# Patient Record
Sex: Female | Born: 1970 | Race: Black or African American | Hispanic: No | Marital: Single | Smoking: Never smoker
Health system: Southern US, Community
[De-identification: ages and names within clinical notes are randomized; demographics above are authoritative.]

## PROBLEM LIST (undated history)

## (undated) DIAGNOSIS — G4730 Sleep apnea, unspecified: Secondary | ICD-10-CM

## (undated) DIAGNOSIS — K0889 Other specified disorders of teeth and supporting structures: Secondary | ICD-10-CM

## (undated) DIAGNOSIS — L309 Dermatitis, unspecified: Secondary | ICD-10-CM

## (undated) DIAGNOSIS — J45909 Unspecified asthma, uncomplicated: Secondary | ICD-10-CM

## (undated) DIAGNOSIS — G8929 Other chronic pain: Secondary | ICD-10-CM

## (undated) DIAGNOSIS — J42 Unspecified chronic bronchitis: Secondary | ICD-10-CM

## (undated) DIAGNOSIS — R5383 Other fatigue: Secondary | ICD-10-CM

## (undated) DIAGNOSIS — J301 Allergic rhinitis due to pollen: Secondary | ICD-10-CM

## (undated) DIAGNOSIS — R7303 Prediabetes: Secondary | ICD-10-CM

## (undated) DIAGNOSIS — R0602 Shortness of breath: Secondary | ICD-10-CM

## (undated) DIAGNOSIS — R739 Hyperglycemia, unspecified: Secondary | ICD-10-CM

## (undated) DIAGNOSIS — I10 Essential (primary) hypertension: Secondary | ICD-10-CM

## (undated) DIAGNOSIS — M7989 Other specified soft tissue disorders: Secondary | ICD-10-CM

## (undated) DIAGNOSIS — M545 Low back pain, unspecified: Secondary | ICD-10-CM

## (undated) DIAGNOSIS — Z8489 Family history of other specified conditions: Secondary | ICD-10-CM

## (undated) DIAGNOSIS — S0300XA Dislocation of jaw, unspecified side, initial encounter: Secondary | ICD-10-CM

## (undated) HISTORY — PX: GINGIVECTOMY: SHX1707

## (undated) HISTORY — DX: Low back pain: M54.5

## (undated) HISTORY — DX: Other specified soft tissue disorders: M79.89

## (undated) HISTORY — DX: Dislocation of jaw, unspecified side, initial encounter: S03.00XA

## (undated) HISTORY — DX: Other specified disorders of teeth and supporting structures: K08.89

## (undated) HISTORY — DX: Low back pain, unspecified: M54.50

## (undated) HISTORY — PX: ANKLE FRACTURE SURGERY: SHX122

## (undated) HISTORY — DX: Dermatitis, unspecified: L30.9

## (undated) HISTORY — DX: Essential (primary) hypertension: I10

## (undated) HISTORY — DX: Hyperglycemia, unspecified: R73.9

## (undated) HISTORY — DX: Other chronic pain: G89.29

## (undated) HISTORY — DX: Unspecified asthma, uncomplicated: J45.909

## (undated) HISTORY — DX: Other fatigue: R53.83

## (undated) HISTORY — DX: Shortness of breath: R06.02

---

## 1998-02-09 ENCOUNTER — Encounter: Admission: RE | Admit: 1998-02-09 | Discharge: 1998-02-09 | Payer: Self-pay | Admitting: *Deleted

## 2000-07-03 HISTORY — PX: SHOULDER SURGERY: SHX246

## 2000-09-10 ENCOUNTER — Emergency Department (HOSPITAL_COMMUNITY): Admission: EM | Admit: 2000-09-10 | Discharge: 2000-09-11 | Payer: Self-pay

## 2000-09-11 ENCOUNTER — Encounter: Payer: Self-pay | Admitting: Emergency Medicine

## 2003-05-14 ENCOUNTER — Other Ambulatory Visit: Admission: RE | Admit: 2003-05-14 | Discharge: 2003-05-14 | Payer: Self-pay | Admitting: Internal Medicine

## 2004-07-08 ENCOUNTER — Other Ambulatory Visit: Admission: RE | Admit: 2004-07-08 | Discharge: 2004-07-08 | Payer: Self-pay | Admitting: Internal Medicine

## 2005-09-14 ENCOUNTER — Other Ambulatory Visit: Admission: RE | Admit: 2005-09-14 | Discharge: 2005-09-14 | Payer: Self-pay | Admitting: Internal Medicine

## 2006-09-18 ENCOUNTER — Other Ambulatory Visit: Admission: RE | Admit: 2006-09-18 | Discharge: 2006-09-18 | Payer: Self-pay | Admitting: Internal Medicine

## 2007-10-17 ENCOUNTER — Other Ambulatory Visit: Admission: RE | Admit: 2007-10-17 | Discharge: 2007-10-17 | Payer: Self-pay | Admitting: Obstetrics and Gynecology

## 2009-10-18 ENCOUNTER — Other Ambulatory Visit: Admission: RE | Admit: 2009-10-18 | Discharge: 2009-10-18 | Payer: Self-pay | Admitting: Obstetrics and Gynecology

## 2010-01-06 ENCOUNTER — Encounter: Admission: RE | Admit: 2010-01-06 | Discharge: 2010-01-06 | Payer: Self-pay | Admitting: Internal Medicine

## 2010-10-26 ENCOUNTER — Encounter: Payer: 59 | Attending: Internal Medicine | Admitting: *Deleted

## 2010-10-26 DIAGNOSIS — Z713 Dietary counseling and surveillance: Secondary | ICD-10-CM | POA: Insufficient documentation

## 2010-12-07 ENCOUNTER — Ambulatory Visit: Payer: 59 | Admitting: *Deleted

## 2010-12-16 ENCOUNTER — Other Ambulatory Visit: Payer: Self-pay | Admitting: Internal Medicine

## 2010-12-16 DIAGNOSIS — Z1231 Encounter for screening mammogram for malignant neoplasm of breast: Secondary | ICD-10-CM

## 2011-01-09 ENCOUNTER — Ambulatory Visit
Admission: RE | Admit: 2011-01-09 | Discharge: 2011-01-09 | Disposition: A | Discharge status: Home / Self Care | Payer: 59 | Source: Ambulatory Visit | Attending: Internal Medicine | Admitting: Internal Medicine

## 2011-01-09 DIAGNOSIS — Z1231 Encounter for screening mammogram for malignant neoplasm of breast: Secondary | ICD-10-CM

## 2011-10-09 ENCOUNTER — Other Ambulatory Visit (HOSPITAL_COMMUNITY)
Admission: RE | Admit: 2011-10-09 | Discharge: 2011-10-09 | Disposition: A | Discharge status: Home / Self Care | Payer: 59 | Source: Ambulatory Visit | Attending: Obstetrics and Gynecology | Admitting: Obstetrics and Gynecology

## 2011-10-09 ENCOUNTER — Other Ambulatory Visit: Payer: Self-pay | Admitting: Obstetrics and Gynecology

## 2011-10-09 DIAGNOSIS — Z01419 Encounter for gynecological examination (general) (routine) without abnormal findings: Secondary | ICD-10-CM | POA: Insufficient documentation

## 2012-01-02 ENCOUNTER — Other Ambulatory Visit: Payer: Self-pay | Admitting: Internal Medicine

## 2012-01-02 DIAGNOSIS — Z1231 Encounter for screening mammogram for malignant neoplasm of breast: Secondary | ICD-10-CM

## 2012-01-12 ENCOUNTER — Ambulatory Visit
Admission: RE | Admit: 2012-01-12 | Discharge: 2012-01-12 | Disposition: A | Discharge status: Home / Self Care | Payer: 59 | Source: Ambulatory Visit | Attending: Internal Medicine | Admitting: Internal Medicine

## 2012-01-12 DIAGNOSIS — Z1231 Encounter for screening mammogram for malignant neoplasm of breast: Secondary | ICD-10-CM

## 2012-11-08 ENCOUNTER — Other Ambulatory Visit: Payer: Self-pay | Admitting: Obstetrics and Gynecology

## 2012-11-08 ENCOUNTER — Other Ambulatory Visit (HOSPITAL_COMMUNITY)
Admission: RE | Admit: 2012-11-08 | Discharge: 2012-11-08 | Disposition: A | Discharge status: Home / Self Care | Payer: 59 | Source: Ambulatory Visit | Attending: Obstetrics and Gynecology | Admitting: Obstetrics and Gynecology

## 2012-11-08 DIAGNOSIS — Z1151 Encounter for screening for human papillomavirus (HPV): Secondary | ICD-10-CM | POA: Insufficient documentation

## 2012-11-08 DIAGNOSIS — Z01419 Encounter for gynecological examination (general) (routine) without abnormal findings: Secondary | ICD-10-CM | POA: Insufficient documentation

## 2013-02-24 ENCOUNTER — Other Ambulatory Visit: Payer: Self-pay

## 2013-02-24 DIAGNOSIS — Z1231 Encounter for screening mammogram for malignant neoplasm of breast: Secondary | ICD-10-CM

## 2013-03-19 ENCOUNTER — Ambulatory Visit
Admission: RE | Admit: 2013-03-19 | Discharge: 2013-03-19 | Disposition: A | Discharge status: Home / Self Care | Payer: 59 | Source: Ambulatory Visit

## 2013-03-19 DIAGNOSIS — Z1231 Encounter for screening mammogram for malignant neoplasm of breast: Secondary | ICD-10-CM

## 2014-02-23 ENCOUNTER — Other Ambulatory Visit: Payer: Self-pay

## 2014-02-23 DIAGNOSIS — Z1231 Encounter for screening mammogram for malignant neoplasm of breast: Secondary | ICD-10-CM

## 2014-03-20 ENCOUNTER — Ambulatory Visit: Payer: 59

## 2014-03-24 ENCOUNTER — Ambulatory Visit
Admission: RE | Admit: 2014-03-24 | Discharge: 2014-03-24 | Disposition: A | Discharge status: Home / Self Care | Payer: 59 | Source: Ambulatory Visit

## 2014-03-24 DIAGNOSIS — Z1231 Encounter for screening mammogram for malignant neoplasm of breast: Secondary | ICD-10-CM

## 2014-11-10 ENCOUNTER — Other Ambulatory Visit (HOSPITAL_COMMUNITY): Payer: Self-pay | Admitting: Obstetrics and Gynecology

## 2014-11-10 ENCOUNTER — Other Ambulatory Visit (HOSPITAL_COMMUNITY)
Admission: RE | Admit: 2014-11-10 | Discharge: 2014-11-10 | Disposition: A | Discharge status: Home / Self Care | Payer: 59 | Source: Ambulatory Visit | Attending: Obstetrics and Gynecology | Admitting: Obstetrics and Gynecology

## 2014-11-10 DIAGNOSIS — Z01419 Encounter for gynecological examination (general) (routine) without abnormal findings: Secondary | ICD-10-CM | POA: Insufficient documentation

## 2014-11-11 LAB — CYTOLOGY - PAP

## 2015-03-01 ENCOUNTER — Other Ambulatory Visit: Payer: Self-pay

## 2015-03-01 DIAGNOSIS — Z1231 Encounter for screening mammogram for malignant neoplasm of breast: Secondary | ICD-10-CM

## 2015-03-30 ENCOUNTER — Ambulatory Visit
Admission: RE | Admit: 2015-03-30 | Discharge: 2015-03-30 | Disposition: A | Discharge status: Home / Self Care | Payer: 59 | Source: Ambulatory Visit

## 2015-03-30 DIAGNOSIS — Z1231 Encounter for screening mammogram for malignant neoplasm of breast: Secondary | ICD-10-CM

## 2016-11-01 DIAGNOSIS — M7662 Achilles tendinitis, left leg: Secondary | ICD-10-CM | POA: Diagnosis not present

## 2016-11-06 ENCOUNTER — Other Ambulatory Visit: Payer: Self-pay | Admitting: Internal Medicine

## 2016-11-06 DIAGNOSIS — Z0000 Encounter for general adult medical examination without abnormal findings: Secondary | ICD-10-CM | POA: Diagnosis not present

## 2016-11-06 DIAGNOSIS — Z1231 Encounter for screening mammogram for malignant neoplasm of breast: Secondary | ICD-10-CM

## 2016-11-08 DIAGNOSIS — M7662 Achilles tendinitis, left leg: Secondary | ICD-10-CM | POA: Diagnosis not present

## 2016-11-15 DIAGNOSIS — M7662 Achilles tendinitis, left leg: Secondary | ICD-10-CM | POA: Diagnosis not present

## 2016-11-29 ENCOUNTER — Ambulatory Visit
Admission: RE | Admit: 2016-11-29 | Discharge: 2016-11-29 | Disposition: A | Discharge status: Home / Self Care | Payer: 59 | Source: Ambulatory Visit | Attending: Internal Medicine | Admitting: Internal Medicine

## 2016-11-29 DIAGNOSIS — Z1231 Encounter for screening mammogram for malignant neoplasm of breast: Secondary | ICD-10-CM

## 2016-12-04 DIAGNOSIS — M7662 Achilles tendinitis, left leg: Secondary | ICD-10-CM | POA: Diagnosis not present

## 2016-12-18 ENCOUNTER — Other Ambulatory Visit: Payer: Self-pay | Admitting: Obstetrics and Gynecology

## 2016-12-18 ENCOUNTER — Other Ambulatory Visit (HOSPITAL_COMMUNITY)
Admission: RE | Admit: 2016-12-18 | Discharge: 2016-12-18 | Disposition: A | Discharge status: Home / Self Care | Payer: 59 | Source: Ambulatory Visit | Attending: Obstetrics and Gynecology | Admitting: Obstetrics and Gynecology

## 2016-12-18 DIAGNOSIS — Z124 Encounter for screening for malignant neoplasm of cervix: Secondary | ICD-10-CM | POA: Diagnosis not present

## 2016-12-21 LAB — CYTOLOGY - PAP
CHLAMYDIA, DNA PROBE: NEGATIVE
DIAGNOSIS: NEGATIVE
HPV: NOT DETECTED
NEISSERIA GONORRHEA: NEGATIVE

## 2017-01-15 ENCOUNTER — Other Ambulatory Visit: Payer: Self-pay | Admitting: Obstetrics and Gynecology

## 2017-01-15 DIAGNOSIS — N840 Polyp of corpus uteri: Secondary | ICD-10-CM | POA: Diagnosis not present

## 2017-01-15 DIAGNOSIS — I10 Essential (primary) hypertension: Secondary | ICD-10-CM | POA: Diagnosis not present

## 2017-01-24 ENCOUNTER — Other Ambulatory Visit (HOSPITAL_COMMUNITY): Payer: Self-pay | Admitting: General Surgery

## 2017-01-24 DIAGNOSIS — M545 Low back pain: Secondary | ICD-10-CM | POA: Diagnosis not present

## 2017-01-24 DIAGNOSIS — Z713 Dietary counseling and surveillance: Secondary | ICD-10-CM | POA: Diagnosis not present

## 2017-01-31 ENCOUNTER — Encounter: Payer: 59 | Attending: General Surgery | Admitting: Registered"

## 2017-01-31 ENCOUNTER — Encounter: Payer: Self-pay | Admitting: Registered"

## 2017-01-31 DIAGNOSIS — Z713 Dietary counseling and surveillance: Secondary | ICD-10-CM | POA: Diagnosis not present

## 2017-01-31 DIAGNOSIS — M545 Low back pain: Secondary | ICD-10-CM | POA: Insufficient documentation

## 2017-01-31 DIAGNOSIS — I10 Essential (primary) hypertension: Secondary | ICD-10-CM | POA: Insufficient documentation

## 2017-01-31 DIAGNOSIS — Z6841 Body Mass Index (BMI) 40.0 and over, adult: Secondary | ICD-10-CM | POA: Diagnosis not present

## 2017-01-31 DIAGNOSIS — E669 Obesity, unspecified: Secondary | ICD-10-CM

## 2017-01-31 NOTE — Progress Notes (Signed)
Pre-Op Assessment Visit:  Pre-Operative Sleeve Gastrectomy Surgery  Medical Nutrition Therapy:  Appt start time: 8:30  End time:  9:30  Patient was seen on 01/31/2017 for Pre-Operative Nutrition Assessment. Assessment and [facility] on 01/31/2017.   Pt expectation of surgery: increase activity level  Pt expectation of Dietitian: good information that applies to lifestyle  Start weight at [facility]: 336.4 BMI: 51.91   Pt states she has some ankle, back, and achilles issues limiting physical activity. Pt states she works at [employer] M-F 9:30am-9:30pm; gets busy at work and forgets to eat at times. Pt reports she learns by doing. Pt states she is planning to have surgery in Nov.   Pt states [facility] is investigating her nutrition visit history with previous dietitians and will contact her about how many visits still needed, if any.    24 hr Dietary Recall: First Meal: breakfast sandwich Snack: none Second Meal: none Snack: cashews, edamame Third Meal: shrimp scampi or grilled chicken, asparagus Snack: none Beverages: water, juice, lemonade, ginger ale (sometimes)  Encouraged to engage in 75 minutes of moderate physical activity including cardiovascular and weight baring weekly  Handouts given during visit include:  . Pre-Op Goals . Bariatric Surgery Protein Shakes . Vitamin and Mineral Recommendations  During the appointment today the following Pre-Op Goals were reviewed with the patient: . Maintain or lose weight as instructed by your surgeon . Make healthy food choices . Begin to limit portion sizes . Limited concentrated sugars and fried foods . Keep fat/sugar in the single digits per serving on          food labels . Practice CHEWING your food  (aim for 30 chews per bite or until applesauce consistency) . Practice not drinking 15 minutes before, during, and 30 minutes after each meal/snack . Avoid all  carbonated beverages  . Avoid/limit caffeinated beverages  . Avoid all sugar-sweetened beverages . Consume 3 meals per day; eat every 3-5 hours . Make a list of non-food related activities . Aim for 64-100 ounces of FLUID daily  . Aim for at least 60-80 grams of PROTEIN daily . Look for a liquid protein source that contain ?15 g protein and ?5 g carbohydrate  (ex: shakes, drinks, shots) . Physical activity is an important part of a healthy lifestyle so keep it moving!  Follow diet recommendations listed below Energy and Macronutrient Recommendations: Calories: 1800 Carbohydrate: 200 Protein: 135 Fat: 50  Demonstrated degree of understanding via:  Teach Back   Teaching Method Utilized:  Visual Auditory Hands on  Barriers to learning/adherence to lifestyle change: work schedule  Patient to call the Nutrition and Diabetes Education Services to enroll in Pre-Op and Post-Op Nutrition Education when surgery date is scheduled.

## 2017-02-06 DIAGNOSIS — M545 Low back pain: Secondary | ICD-10-CM | POA: Diagnosis not present

## 2017-02-09 ENCOUNTER — Ambulatory Visit (HOSPITAL_COMMUNITY)
Admission: RE | Admit: 2017-02-09 | Discharge: 2017-02-09 | Disposition: A | Discharge status: Home / Self Care | Payer: 59 | Source: Ambulatory Visit | Attending: General Surgery | Admitting: General Surgery

## 2017-02-09 DIAGNOSIS — R001 Bradycardia, unspecified: Secondary | ICD-10-CM | POA: Insufficient documentation

## 2017-02-09 DIAGNOSIS — K224 Dyskinesia of esophagus: Secondary | ICD-10-CM | POA: Diagnosis not present

## 2017-02-09 DIAGNOSIS — Z01818 Encounter for other preprocedural examination: Secondary | ICD-10-CM | POA: Diagnosis not present

## 2017-02-09 DIAGNOSIS — Z01810 Encounter for preprocedural cardiovascular examination: Secondary | ICD-10-CM | POA: Insufficient documentation

## 2017-02-27 ENCOUNTER — Encounter: Payer: Self-pay | Admitting: Neurology

## 2017-02-27 ENCOUNTER — Ambulatory Visit (INDEPENDENT_AMBULATORY_CARE_PROVIDER_SITE_OTHER): Payer: 59 | Admitting: Neurology

## 2017-02-27 VITALS — BP 179/103 | HR 61 | Ht 69.0 in | Wt 339.0 lb

## 2017-02-27 DIAGNOSIS — G2581 Restless legs syndrome: Secondary | ICD-10-CM

## 2017-02-27 DIAGNOSIS — Z820 Family history of epilepsy and other diseases of the nervous system: Secondary | ICD-10-CM

## 2017-02-27 DIAGNOSIS — G4763 Sleep related bruxism: Secondary | ICD-10-CM

## 2017-02-27 DIAGNOSIS — Z6841 Body Mass Index (BMI) 40.0 and over, adult: Secondary | ICD-10-CM

## 2017-02-27 DIAGNOSIS — R351 Nocturia: Secondary | ICD-10-CM | POA: Diagnosis not present

## 2017-02-27 DIAGNOSIS — J4520 Mild intermittent asthma, uncomplicated: Secondary | ICD-10-CM | POA: Diagnosis not present

## 2017-02-27 DIAGNOSIS — M26629 Arthralgia of temporomandibular joint, unspecified side: Secondary | ICD-10-CM

## 2017-02-27 DIAGNOSIS — R0683 Snoring: Secondary | ICD-10-CM

## 2017-02-27 DIAGNOSIS — J0391 Acute recurrent tonsillitis, unspecified: Secondary | ICD-10-CM

## 2017-02-27 DIAGNOSIS — J301 Allergic rhinitis due to pollen: Secondary | ICD-10-CM | POA: Diagnosis not present

## 2017-02-27 NOTE — Patient Instructions (Addendum)
Based on your symptoms and your exam I believe you are at risk for obstructive sleep apnea or OSA, and I think we should proceed with a sleep study to determine whether you do or do not have OSA and how severe it is. If you have more than mild OSA, I want you to consider treatment with CPAP. Please remember, the risks and ramifications of moderate to severe obstructive sleep apnea or OSA are: Cardiovascular disease, including congestive heart failure, stroke, difficult to control hypertension, arrhythmias, and even type 2 diabetes has been linked to untreated OSA. Sleep apnea causes disruption of sleep and sleep deprivation in most cases, which, in turn, can cause recurrent headaches, problems with memory, mood, concentration, focus, and vigilance. Most people with untreated sleep apnea report excessive daytime sleepiness, which can affect their ability to drive. Please do not drive if you feel sleepy.   I will likely see you back after your sleep study to go over the test results and where to go from there. We will call you after your sleep study to advise about the results (most likely, you will hear from Diana, my nurse) and to set up an appointment at the time, as necessary.    Our sleep lab administrative assistant, Dawn will call you to schedule your sleep study. If you don't hear back from her by about 2 weeks from now, feel free to call her at [phone]. This is her direct line and please leave a message with your phone number to call back if you get the voicemail box. She will call back as soon as possible.   

## 2017-02-27 NOTE — Progress Notes (Signed)
Subjective:    Patient ID: Leslie Mason is a 46 y.o. female.  HPI     Huston Foley, MD, PhD [facility] [address], [address] [address] [place], [place] [address]  Dear Dr. Andrey Campanile,   I saw your patient, [name], upon your kind request in my neurologic clinic today for initial consultation of her sleep disorder, in particular, concern for underlying obstructive sleep apnea. The patient is unaccompanied today. As you know, [name] is a 46 year old right-handed woman with an underlying medical history of asthma, hypertension, elevated blood sugar, chronic low back pain, and morbid obesity with a BMI of over 50, who reports snoring and sleep disruption. I reviewed your office note from 01/24/2017, which you kindly included. She is in the process of being evaluated for bariatric surgery and is hoping to get surgery by the fall. Her Epworth sleepiness score is 0 out of 24, fatigue score is 17 out of 63. She lives alone, no kids. She works for [employer]. She is a nonsmoker, drinks alcohol infrequently and caffeine infrequently as well. She tries to drink only water or mostly water. She has significant nocturia about 2-3 times on average, sometimes as many as 5 times per night. She denies morning headaches. She has a family history of sleep apnea in her brother who has a CPAP machine. She tries to be in bed around 11 and typically watches TV for some time and turns it off. Wake up time is between 8 and 8:30 AM. She also endorses intermittent restless leg symptoms including a crawling sensation in her legs and it helps to move. In the mornings, her bed is typically disheveled. She was diagnosed with asthma when she was in high school. She has seasonal allergies as well. She wakes up in the mornings with drainage and mucus as well as coughing and sometimes feels like she is choking on her drainage. She also has a history of bruxism and TMJ issues, mostly on the left.  Her  Past Medical History Is Significant For: Past Medical History:  Diagnosis Date  . Asthma   . Blood glucose elevated   . Chronic low back pain without sciatica   . Hypertension     Her Past Surgical History Is Significant For: Past Surgical History:  Procedure Laterality Date  . SHOULDER SURGERY      Her Family History Is Significant For: Family History  Problem Relation Age of Onset  . Breast cancer Maternal Aunt   . Asthma Other   . Cancer Other   . Hypertension Other   . Prostate cancer Father     Her Social History Is Significant For: Social History   Social History  . Marital status: Single    Spouse name: N/A  . Number of children: N/A  . Years of education: N/A   Social History Main Topics  . Smoking status: Never Smoker  . Smokeless tobacco: Never Used  . Alcohol use Yes  . Drug use: No  . Sexual activity: Not Asked   Other Topics Concern  . None   Social History Narrative  . None    Her Allergies Are:  Allergies  Allergen Reactions  . Contrave [Naltrexone-Bupropion Hcl Er] Other (See Comments)    constipation  :   Her Current Medications Are:  Outpatient Encounter Prescriptions as of 02/27/2017  Medication Sig  . Biotin 1 MG CAPS Take by mouth.  . Cholecalciferol (VITAMIN D3) 5000 units CAPS Take 15,000 Units by mouth  daily.  . medroxyPROGESTERone (PROVERA) 10 MG tablet Take 10 mg by mouth daily as needed.   . nebivolol (BYSTOLIC) 5 MG tablet Take 5 mg by mouth daily.  . [DISCONTINUED] Vitamin D, Ergocalciferol, (DRISDOL) 50000 units CAPS capsule Take 50,000 Units by mouth every 7 (seven) days.   No facility-administered encounter medications on file as of 02/27/2017.   :  Review of Systems:  Out of a complete 14 point review of systems, all are reviewed and negative with the exception of these symptoms as listed below: Review of Systems  Neurological:       Pt presents today to discuss her sleep. Pt has never had a sleep study and is not  sure if she snores. Pt is bariatric surgery candidate.  Epworth Sleepiness Scale 0= would never doze 1= slight chance of dozing 2= moderate chance of dozing 3= high chance of dozing  Sitting and reading: 0 Watching TV: 0 Sitting inactive in a public place (ex. Theater or meeting): 0 As a passenger in a car for an hour without a break: 0 Lying down to rest in the afternoon: 0 Sitting and talking to someone: 0 Sitting quietly after lunch (no alcohol): 0 In a car, while stopped in traffic: 0 Total: 0     Objective:  Neurological Exam  Physical Exam Physical Examination:   Vitals:   02/27/17 0856  BP: (!) 179/103  Pulse: 61   General Examination: The patient is a very pleasant 46 y.o. female in no acute distress. She appears well-developed and well-nourished and well groomed.   HEENT: Normocephalic, atraumatic, pupils are equal, round and reactive to light and accommodation. Funduscopic exam is normal with sharp disc margins noted. Extraocular tracking is good without limitation to gaze excursion or nystagmus noted. Normal smooth pursuit is noted. Hearing is grossly intact. Tympanic membranes are clear bilaterally. Face is symmetric with normal facial animation and normal facial sensation. Speech is clear with no dysarthria noted. There is no hypophonia. There is no lip, neck/head, jaw or voice tremor. Neck is supple with full range of passive and active motion. There are no carotid bruits on auscultation. Oropharynx exam reveals: mild mouth dryness, adequate dental hygiene and moderate airway crowding, due to larger tongue, larger uvula, tonsils of 2+ and redundant soft palate. Mallampati is class III. Tongue protrudes centrally and palate elevates symmetrically. Neck size is 16 3/8 inches. She has a nearly absent overbite. Nasal inspection reveals no significant nasal mucosal bogginess or redness and mild septal deviation to R. Mild crepitation is noted with jaw opening at the left TMJ  and also a click on the right.  Chest: Clear to auscultation without wheezing, rhonchi or crackles noted.  Heart: S1+S2+0, regular and normal without murmurs, rubs or gallops noted.   Abdomen: Soft, non-tender and non-distended with normal bowel sounds appreciated on auscultation.  Extremities: There is trace pitting edema in the L ankle.   Skin: Warm and dry without trophic changes noted.  Musculoskeletal: exam reveals no obvious joint deformities, tenderness or joint swelling or erythema.   Neurologically:  Mental status: The patient is awake, alert and oriented in all 4 spheres. Her immediate and remote memory, attention, language skills and fund of knowledge are appropriate. There is no evidence of aphasia, agnosia, apraxia or anomia. Speech is clear with normal prosody and enunciation. Thought process is linear. Mood is normal and affect is normal.  Cranial nerves II - XII are as described above under HEENT exam. In addition: shoulder shrug  is normal with equal shoulder height noted. Motor exam: Normal bulk, strength and tone is noted. There is no drift, tremor or rebound. Romberg is negative. Reflexes are 1+ throughout. Fine motor skills and coordination: intact with normal finger taps, normal hand movements, normal rapid alternating patting, normal foot taps and normal foot agility.  Cerebellar testing: No dysmetria or intention tremor on finger to nose testing. Heel to shin is unremarkable bilaterally. There is no truncal or gait ataxia.  Sensory exam: intact to light touch in the upper and lower extremities.  Gait, station and balance: She stands easily. No veering to one side is noted. No leaning to one side is noted. Posture is age-appropriate and stance is narrow based. Gait shows normal stride length and normal pace. No problems turning are noted.  Assessment and Plan:   In summary, Shyla Gayheart is a very pleasant 46 y.o. old female with an underlying medical history of  asthma, hypertension, elevated blood sugar, chronic low back pain, and morbid obesity with a BMI of over 50, whose history and physical exam are concerning for obstructive sleep apnea (OSA). I had a long chat with the patient about my findings and the diagnosis of OSA, its prognosis and treatment options. We talked about medical treatments, surgical interventions and non-pharmacological approaches. I explained in particular the risks and ramifications of untreated moderate to severe OSA, especially with respect to developing cardiovascular disease down the Road, including congestive heart failure, difficult to treat hypertension, cardiac arrhythmias, or stroke. Even type 2 diabetes has, in part, been linked to untreated OSA. Symptoms of untreated OSA include daytime sleepiness, memory problems, mood irritability and mood disorder such as depression and anxiety, lack of energy, as well as recurrent headaches, especially morning headaches. We talked about trying to maintain a healthy lifestyle in general, as well as the importance of weight control. I encouraged the patient to eat healthy, exercise daily and keep well hydrated, to keep a scheduled bedtime and wake time routine, to not skip any meals and eat healthy snacks in between meals. I advised the patient not to drive when feeling sleepy. I recommended the following at this time: sleep study with potential positive airway pressure titration. (We will score hypopneas at 4%).   I explained the sleep test procedure to the patient and also outlined possible surgical and non-surgical treatment options of OSA, including the use of a custom-made dental device (which would require a referral to a specialist dentist or oral surgeon), upper airway surgical options, such as pillar implants, radiofrequency surgery, tongue base surgery, and UPPP (which would involve a referral to an ENT surgeon). Rarely, jaw surgery such as mandibular advancement may be considered.  I  also explained the CPAP treatment option to the patient, who indicated that she would be willing to try CPAP if the need arises. I explained the importance of being compliant with PAP treatment, not only for insurance purposes but primarily to improve Her symptoms, and for the patient's long term health benefit, including to reduce Her cardiovascular risks. I answered all her questions today and the patient was in agreement. I would like to see her back after the sleep study is completed and encouraged her to call with any interim questions, concerns, problems or updates.   Thank you very much for allowing me to participate in the care of this nice patient. If I can be of any further assistance to you please do not hesitate to call me at [phone].  Sincerely,  Star Age, MD, PhD

## 2017-03-06 ENCOUNTER — Encounter: Payer: Self-pay | Admitting: Registered"

## 2017-03-06 ENCOUNTER — Encounter: Payer: 59 | Attending: General Surgery | Admitting: Registered"

## 2017-03-06 DIAGNOSIS — Z6841 Body Mass Index (BMI) 40.0 and over, adult: Secondary | ICD-10-CM | POA: Diagnosis not present

## 2017-03-06 DIAGNOSIS — I10 Essential (primary) hypertension: Secondary | ICD-10-CM | POA: Diagnosis not present

## 2017-03-06 DIAGNOSIS — M545 Low back pain: Secondary | ICD-10-CM | POA: Diagnosis not present

## 2017-03-06 DIAGNOSIS — Z713 Dietary counseling and surveillance: Secondary | ICD-10-CM | POA: Diagnosis not present

## 2017-03-06 DIAGNOSIS — E669 Obesity, unspecified: Secondary | ICD-10-CM

## 2017-03-06 NOTE — Patient Instructions (Addendum)
-   Continue to practice CHEWING your food  (aim for 30 chews per bite or until applesauce consistency).  - Continue to practice not drinking 15 minutes before, during, and 30 minutes after each meal/snack.  - Aim to decrease sugar-sweetened beverage/juice consumption.  - Find out how many nutrition visits are needed with us prior to surgery.

## 2017-03-06 NOTE — Progress Notes (Signed)
Appt start time: 2:05 end time: 2:20  Assessment: 1st SWL Appointment.   Start Wt at NDES: 336.4 Wt: 338.3 BMI: 49.96   Pt arrives having gained about 2 lbs from previous visit.  Pt states she has tried [employer] protein caramel flavor along with clear drink options; enjoys them. Pt states she likes to have variety with eating.   Pt states she will find out from [facility] how many visits she needs with us prior to surgery.   MEDICATIONS: See list   DIETARY INTAKE:  24-hr recall:  B ( AM): bacon, eggs, grits  Snk ( AM): none  L ( PM): protein shake  Snk ( PM): beef jerky  D ( PM): chicken sausage, sauteed spinach Snk ( PM): none Beverages: water, lemonade, juice  Usual physical activity: walking 2x/week  Diet to Follow: 1800 calories 200 g carbohydrates 135 g protein 50 g fat  Preferred Learning Style:   No preference indicated   Learning Readiness:   Ready  Change in progress     Nutritional Diagnosis:  [place]-3.3 Overweight/obesity related to past poor dietary habits and physical inactivity as evidenced by patient w/ planned sleeve gastrectomy surgery following dietary guidelines for continued weight loss.    Intervention:  Nutrition counseling for upcoming Bariatric Surgery.  Goals:  - Continue to practice CHEWING your food  (aim for 30 chews per bite or until applesauce consistency). - Continue to practice not drinking 15 minutes before, during, and 30 minutes after each meal/snack. - Aim to decrease sugar-sweetened beverage/juice consumption. - Find out how many nutrition visits are needed with us prior to surgery.   Teaching Method Utilized:  Visual Auditory Hands on  Handouts given during visit include:  none  Barriers to learning/adherence to lifestyle change: none  Demonstrated degree of understanding via:  Teach Back   Monitoring/Evaluation:  Dietary intake, exercise, and body weight in 1 month(s).

## 2017-03-20 ENCOUNTER — Ambulatory Visit (INDEPENDENT_AMBULATORY_CARE_PROVIDER_SITE_OTHER): Payer: 59 | Admitting: Psychiatry

## 2017-03-20 DIAGNOSIS — F509 Eating disorder, unspecified: Secondary | ICD-10-CM | POA: Diagnosis not present

## 2017-03-21 DIAGNOSIS — Z23 Encounter for immunization: Secondary | ICD-10-CM | POA: Diagnosis not present

## 2017-03-26 ENCOUNTER — Telehealth: Payer: Self-pay | Admitting: Neurology

## 2017-03-26 DIAGNOSIS — Z6841 Body Mass Index (BMI) 40.0 and over, adult: Principal | ICD-10-CM

## 2017-03-26 NOTE — Telephone Encounter (Signed)
[employer] denied Split sleep and approved HST.  Can I get an order for HST? °

## 2017-04-04 ENCOUNTER — Encounter: Payer: 59 | Attending: General Surgery | Admitting: Registered"

## 2017-04-04 ENCOUNTER — Encounter: Payer: Self-pay | Admitting: Registered"

## 2017-04-04 DIAGNOSIS — M545 Low back pain: Secondary | ICD-10-CM | POA: Diagnosis not present

## 2017-04-04 DIAGNOSIS — Z6841 Body Mass Index (BMI) 40.0 and over, adult: Secondary | ICD-10-CM | POA: Diagnosis not present

## 2017-04-04 DIAGNOSIS — E669 Obesity, unspecified: Secondary | ICD-10-CM

## 2017-04-04 DIAGNOSIS — Z713 Dietary counseling and surveillance: Secondary | ICD-10-CM | POA: Insufficient documentation

## 2017-04-04 DIAGNOSIS — I10 Essential (primary) hypertension: Secondary | ICD-10-CM | POA: Insufficient documentation

## 2017-04-04 NOTE — Progress Notes (Signed)
Appt start time: 9:20 end time: 9:48  Assessment: 2nd SWL Appointment.   Start Wt at NDES: 336.4 Wt: 342.9 BMI: 52.91   Pt states she avoids tomatoes.  Pt arrives having gained about 4.6 lbs from previous visit. Pt states she is getting better with chewing and being more mindful when eating by stepping away from her desk to eat. Pt states has been doing well with not using straws; still working on not drinking with meals. Pt states she is working to increase vegetable intake and identifying protein sources. Pt states she attended her family reunion recently and the environment well by making healthy food choices. Pt states she may join [employer] soon because they are having a special and she wants more variety for exercise other than walking.   Pt states she needs 1 more visit with [place] prior to having surgery.    MEDICATIONS: See list   DIETARY INTAKE:  24-hr recall:  B ( AM): protein shake or bacon, eggs, grits  Snk ( AM): none  L ( PM): protein shake   Snk ( PM): beef jerky  D ( PM): chicken sausage, sauteed spinach Snk ( PM): none Beverages: water, lemonade, juice  Usual physical activity: walking 2x/week  Diet to Follow: 1800 calories 200 g carbohydrates 135 g protein 50 g fat  Preferred Learning Style:   No preference indicated   Learning Readiness:   Ready  Change in progress     Nutritional Diagnosis:  [place]-3.3 Overweight/obesity related to past poor dietary habits and physical inactivity as evidenced by patient w/ planned sleeve gastrectomy surgery following dietary guidelines for continued weight loss.    Intervention:  Nutrition counseling for upcoming Bariatric Surgery.  Goals:  - Continue to aim to increase vegetable intake with meals and snacks.  - Continue to practice CHEWING your food  (aim for 30 chews per bite or until applesauce consistency). - Continue to practice not drinking 15 minutes before, during, and 30 minutes after each  meal/snack. - Check into 24-hr [employer] locations:       [address]           [phone]      [address] [phone]      [phone]  Teaching Method Utilized:  Visual Auditory Hands on  Handouts given during visit include:  none  Barriers to learning/adherence to lifestyle change: none  Demonstrated degree of understanding via:  Teach Back   Monitoring/Evaluation:  Dietary intake, exercise, and body weight in 1 month(s).

## 2017-04-04 NOTE — Patient Instructions (Addendum)
-   Continue to aim to increase vegetable intake with meals and snacks.   - Continue to practice CHEWING your food  (aim for 30 chews per bite or until applesauce consistency).  - Continue to practice not drinking 15 minutes before, during, and 30 minutes after each meal/snack.  - Check into 24-hr [employer] locations:       [address]           [phone]      [address] [phone]      [phone]

## 2017-04-11 ENCOUNTER — Ambulatory Visit (INDEPENDENT_AMBULATORY_CARE_PROVIDER_SITE_OTHER): Payer: 59 | Admitting: Neurology

## 2017-04-11 DIAGNOSIS — G4733 Obstructive sleep apnea (adult) (pediatric): Secondary | ICD-10-CM | POA: Diagnosis not present

## 2017-04-11 DIAGNOSIS — Z6841 Body Mass Index (BMI) 40.0 and over, adult: Secondary | ICD-10-CM

## 2017-04-16 NOTE — Progress Notes (Signed)
Patient referred by Dr. Andrey Campanile (surgeon), seen by me on 02/27/17, HST on 04/11/17:  Please call and notify the patient that the recent home sleep test did suggest the diagnosis of overall mild obstructive sleep apnea with a total AHI of 7.3/hour and O2 nadir of 86%. Given the patient's medical history and sleep related complaints, treatment with positive airway pressure is recommended, especially, in light of planned weight loss surgery in the near future. The treatment can be achieved in the form of autoPAP trial/titration at home. This means, that we don't have to bring her in for a sleep study with CPAP, but will let her try an autoPAP machine at home, through a DME company (of her choice, or as per insurance requirement). The DME representative will educate her on how to use the machine, how to put the mask on, etc. I have placed an order in the chart. Please send referral, talk to patient, send report to PCP and referring MD. We will need a FU in sleep clinic for 10 weeks post-PAP set up, please arrange that as well, may see one of our NPs. Thanks,   Huston Foley, MD, PhD [facility])   Huston Foley, MD, PhD [facility])

## 2017-04-16 NOTE — Addendum Note (Signed)
Addended by: Huston Foley on: 04/16/2017 01:52 PM   Modules accepted: Orders

## 2017-04-16 NOTE — Procedures (Signed)
  [facility] [address] [place], [place] [address] NAME: Leslie Mason                                                                DOB: 1971-03-21 MEDICAL RECORD NUMBER [id number]                                                                      DOS: 04/11/17 REFERRING PHYSICIAN: Gaynelle Adu, MD STUDY PERFORMED: Home Sleep Study HISTORY: 46 year old woman with a history of asthma, hypertension, elevated blood sugar, chronic low back pain, and morbid obesity with a BMI of over 50, who reports snoring and sleep disruption. She is in the process of being evaluated for bariatric surgery.  STUDY RESULTS: Total Recording: 7 Hours, 14 Minutes Total Apnea/Hypopnea Index (AHI):    7.3/Hour Average Oxygen Saturation:    94% Lowest Oxygen Saturation:       86%  Time Oxygen Saturation Below 88%:  2 min Average Heart Rate:      64 BPM IMPRESSION: OSA RECOMMENDATION: This home sleep test demonstrates overall mild obstructive sleep apnea with a total AHI of 7.3/hour and O2 nadir of 86%. Given the patient's medical history and sleep related complaints, treatment with positive airway pressure is recommended, especially, in light of planned weight loss surgery in the near future. The treatment can be achieved in the form of autoPAP trial/titration at home. A full night CPAP titration study will help with proper treatment settings and mask fitting if needed. Please note that untreated obstructive sleep apnea carries additional perioperative morbidity. Patients with significant obstructive sleep apnea should receive perioperative PAP therapy and the surgeons and particularly the anesthesiologist should be informed of the diagnosis and the severity of the sleep disordered breathing. The patient should be cautioned not to drive, work at heights, or operate dangerous or heavy equipment when tired or sleepy. Review and reiteration of good sleep hygiene measures should be pursued  with any patient. The patient and his referring provider will be notified of the test results. The patient will be seen in follow up in sleep clinic at [facility].  I certify that I have reviewed the raw data recording prior to the issuance of this report in accordance with the standards of the American Academy of Sleep Medicine (AASM).  Huston Foley, MD, PhD [facility]) Diplomat, ABPN (neurology and sleep)

## 2017-04-17 ENCOUNTER — Telehealth: Payer: Self-pay

## 2017-04-17 ENCOUNTER — Other Ambulatory Visit: Payer: Self-pay | Admitting: Neurology

## 2017-04-17 NOTE — Telephone Encounter (Signed)
I called pt. I advised pt that Dr. Frances Furbish reviewed their sleep study results and found that pt Sleep apnea. Dr. Frances Furbish recommends that pt auto CPAP. I reviewed PAP compliance expectations with the pt. Pt is agreeable to starting a CPAP. I advised pt that an order will be sent to a DME, [employer], and [employer] will call the pt within about one week after they file with the pt's insurance. [employer] will show the pt how to use the machine, fit for masks, and troubleshoot the CPAP if needed. A follow up appt was made for insurance purposes with Dr. Frances Furbish on Jul 05 2016 at 9:30. Pt verbalized understanding to arrive 15 minutes early and bring their CPAP. A letter with all of this information in it will be mailed to the pt as a reminder. I verified with the pt that the address we have on file is correct. Pt verbalized understanding of results. Pt had no questions at this time but was encouraged to call back if questions arise.

## 2017-04-17 NOTE — Telephone Encounter (Signed)
-----   Message from Huston Foley, MD sent at 04/16/2017  1:52 PM EDT ----- Patient referred by Dr. Andrey Campanile (surgeon), seen by me on 02/27/17, HST on 04/11/17:  Please call and notify the patient that the recent home sleep test did suggest the diagnosis of overall mild obstructive sleep apnea with a total AHI of 7.3/hour and O2 nadir of 86%. Given the patient's medical history and sleep related complaints, treatment with positive airway pressure is recommended, especially, in light of planned weight loss surgery in the near future. The treatment can be achieved in the form of autoPAP trial/titration at home. This means, that we don't have to bring her in for a sleep study with CPAP, but will let her try an autoPAP machine at home, through a DME company (of her choice, or as per insurance requirement). The DME representative will educate her on how to use the machine, how to put the mask on, etc. I have placed an order in the chart. Please send referral, talk to patient, send report to PCP and referring MD. We will need a FU in sleep clinic for 10 weeks post-PAP set up, please arrange that as well, may see one of our NPs. Thanks,   Huston Foley, MD, PhD [facility])   Huston Foley, MD, PhD [facility])

## 2017-04-17 NOTE — Telephone Encounter (Signed)
I believe Baird Lyons has already call this pt for results.

## 2017-04-17 NOTE — Telephone Encounter (Signed)
-----   Message from Saima Athar, MD sent at 04/16/2017  1:52 PM EDT ----- Patient referred by Dr. Wilson (surgeon), seen by me on 02/27/17, HST on 04/11/17:  Please call and notify the patient that the recent home sleep test did suggest the diagnosis of overall mild obstructive sleep apnea with a total AHI of 7.3/hour and O2 nadir of 86%. Given the patient's medical history and sleep related complaints, treatment with positive airway pressure is recommended, especially, in light of planned weight loss surgery in the near future. The treatment can be achieved in the form of autoPAP trial/titration at home. This means, that we don't have to bring her in for a sleep study with CPAP, but will let her try an autoPAP machine at home, through a DME company (of her choice, or as per insurance requirement). The DME representative will educate her on how to use the machine, how to put the mask on, etc. I have placed an order in the chart. Please send referral, talk to patient, send report to PCP and referring MD. We will need a FU in sleep clinic for 10 weeks post-PAP set up, please arrange that as well, may see one of our NPs. Thanks,   Saima Athar, MD, PhD [facility] ([facility])   Saima Athar, MD, PhD [facility] ([facility])     

## 2017-04-19 ENCOUNTER — Ambulatory Visit (INDEPENDENT_AMBULATORY_CARE_PROVIDER_SITE_OTHER): Payer: 59 | Admitting: Psychiatry

## 2017-04-19 DIAGNOSIS — F509 Eating disorder, unspecified: Secondary | ICD-10-CM

## 2017-05-03 ENCOUNTER — Encounter: Payer: Self-pay | Admitting: Registered"

## 2017-05-03 ENCOUNTER — Encounter: Payer: 59 | Attending: General Surgery | Admitting: Registered"

## 2017-05-03 DIAGNOSIS — M545 Low back pain: Secondary | ICD-10-CM | POA: Insufficient documentation

## 2017-05-03 DIAGNOSIS — I10 Essential (primary) hypertension: Secondary | ICD-10-CM | POA: Diagnosis not present

## 2017-05-03 DIAGNOSIS — E669 Obesity, unspecified: Secondary | ICD-10-CM

## 2017-05-03 DIAGNOSIS — Z713 Dietary counseling and surveillance: Secondary | ICD-10-CM | POA: Diagnosis not present

## 2017-05-03 DIAGNOSIS — Z6841 Body Mass Index (BMI) 40.0 and over, adult: Secondary | ICD-10-CM | POA: Insufficient documentation

## 2017-05-03 NOTE — Progress Notes (Signed)
Appt start time: 9:35 end time: 10:00  Assessment: 3rd SWL Appointment.   Start Wt at [facility]: 336.4 Wt: 340.9 BMI: 52.60   Pt states she avoids tomatoes.  Pt arrives having lost about 2 lbs from previous visit. Pt states she is in the process of selling her home. Pt states tentative surgery date is 12/3. Pt states she has researched [employer] and currently making sure the extra expense will fit her budget with surgery along with preparing house to be on the market. Pt states she has improved on not drinking with meals. Pt states she can sense fullness while eating. Pt states she is doing well with not using straws. Pt states she is currently taking a gummy multivitamin and waiting to finish them before she purchases bariatric multivitamins.    This is the last SWL visit prior to surgery.    MEDICATIONS: See list   DIETARY INTAKE:  24-hr recall:  B ( AM): Healthy choice breakfast bowl  Snk ( AM): none  L ( PM): salad  Snk ( PM): beef jerky  D ( PM): vegetable soup, sandwich Snk ( PM): none Beverages: water (64+ oz), Nature's Twist lemonade, protein clear drink  Usual physical activity: walking 2x/week  Diet to Follow: 1800 calories 200 g carbohydrates 135 g protein 50 g fat  Preferred Learning Style:   No preference indicated   Learning Readiness:   Ready  Change in progress     Nutritional Diagnosis:  Kingsley-3.3 Overweight/obesity related to past poor dietary habits and physical inactivity as evidenced by patient w/ planned sleeve gastrectomy surgery following dietary guidelines for continued weight loss.    Intervention:  Nutrition counseling for upcoming Bariatric Surgery.  Goals:  - Continue to work on habits that you already have in place with chewing, not drinking with meals, exceeding 64 fluid ounces a day.   Teaching Method Utilized:  Visual Auditory  Handouts given during visit include:  none  Barriers to learning/adherence to lifestyle change:  none  Demonstrated degree of understanding via:  Teach Back   Monitoring/Evaluation:  Dietary intake, exercise, and body weight prn.

## 2017-05-03 NOTE — Patient Instructions (Addendum)
-   Continue to work on habits that you already have in place with chewing, not drinking with meals, exceeding 64 fluid ounces a day.

## 2017-05-07 ENCOUNTER — Encounter: Payer: 59 | Admitting: Registered"

## 2017-05-07 DIAGNOSIS — E669 Obesity, unspecified: Secondary | ICD-10-CM

## 2017-05-07 NOTE — Progress Notes (Signed)
  Pre-Operative Nutrition Class:  Appt start time: 8:15   End time:  9:15.  Patient was seen on 05/07/2017 for Pre-Operative Bariatric Surgery [facility]   Surgery date: 06/04/2017 Surgery type: Sleeve Start weight at [facility]: 336.7 Weight today: 338.0   Samples given per MNT protocol. Patient educated on appropriate usage: Bariatric Advantage Multivitamin Lot # [id number] Exp: 12/2017  Bariatric Advantage Calcium Citrate Lot # [id number] Exp: 04/05/2018  Renee Pain Protein Shake Lot # [id number] Exp: 11/27/2017   The following the learning objectives were met by the patient during this course:  Identify Pre-Op Dietary Goals and will begin 2 weeks pre-operatively  Identify appropriate sources of fluids and proteins   State protein recommendations and appropriate sources pre and post-operatively  Identify Post-Operative Dietary Goals and will follow for 2 weeks post-operatively  Identify appropriate multivitamin and calcium sources  Describe the need for physical activity post-operatively and will follow MD recommendations  State when to call healthcare provider regarding medication questions or post-operative complications  Handouts given during class include:  Pre-Op Bariatric Surgery Diet Handout  Protein Shake Handout  Post-Op Bariatric Surgery Nutrition Handout  BELT Program Information Flyer  Support Group Information Flyer  [facility] Bariatric Supplements Price List  Follow-Up Plan: Patient will follow-up at [facility] 2 weeks post operatively for diet advancement per MD.

## 2017-05-09 DIAGNOSIS — J309 Allergic rhinitis, unspecified: Secondary | ICD-10-CM | POA: Diagnosis not present

## 2017-05-09 DIAGNOSIS — I10 Essential (primary) hypertension: Secondary | ICD-10-CM | POA: Diagnosis not present

## 2017-05-10 DIAGNOSIS — G4733 Obstructive sleep apnea (adult) (pediatric): Secondary | ICD-10-CM | POA: Diagnosis not present

## 2017-05-15 NOTE — Progress Notes (Signed)
Need orders in epic.  Surgery on 06/04/17.  

## 2017-05-28 ENCOUNTER — Ambulatory Visit: Payer: Self-pay | Admitting: General Surgery

## 2017-05-29 NOTE — Progress Notes (Signed)
Ekg, cxr 02-09-17 epic

## 2017-05-29 NOTE — Patient Instructions (Signed)
Leslie MaserMelinda Mason  05/29/2017   Your procedure is scheduled on: 06-04-17  [facility]  Entrance [place]  elevators to 3rd floor to  Short Stay Center at 630AM.   Call this number if you have problems the morning of surgery [phone]   Remember: ONLY 1 PERSON MAY GO WITH YOU TO SHORT STAY TO GET  READY MORNING OF YOUR SURGERY.  Do not eat food or drink liquids :After Midnight.    PLEASE BRING CPAP MASK AND TUBING TO [facility]. DEVICE WILL BE PROVIDED!   Take these medicines the morning of surgery with A SIP OF WATER: NEBIVOLOL, INHALER AND NASAL SPRAY IF NEED (MAY BRING TO [facility]),                                 You may not have any metal on your body including hair pins and              piercings  Do not wear jewelry, make-up, lotions, powders or perfumes, deodorant             Do not wear nail polish.  Do not shave  48 hours prior to surgery.     Do not bring valuables to the [facility]. [place] IS NOT             RESPONSIBLE   FOR VALUABLES.  Contacts, dentures or bridgework may not be worn into surgery.  Leave suitcase in the car. After surgery it may be brought to your room.                 Please read over the following fact sheets you were given: _____________________________________________________________________             [facility] - Preparing for Surgery Before surgery, you can play an important role.  Because skin is not sterile, your skin needs to be as free of germs as possible.  You can reduce the number of germs on your skin by washing with CHG (chlorahexidine gluconate) soap before surgery.  CHG is an antiseptic cleaner which kills germs and bonds with the skin to continue killing germs even after washing. Please DO NOT use if you have an allergy to CHG or antibacterial soaps.  If your skin becomes reddened/irritated stop using the CHG and inform your nurse when you arrive at Short Stay. Do not shave (including  legs and underarms) for at least 48 hours prior to the first CHG shower.  You may shave your face/neck. Please follow these instructions carefully:  1.  Shower with CHG Soap the night before surgery and the  morning of Surgery.  2.  If you choose to wash your hair, wash your hair first as usual with your  normal  shampoo.  3.  After you shampoo, rinse your hair and body thoroughly to remove the  shampoo.                           4.  Use CHG as you would any other liquid soap.  You can apply chg directly  to the skin and wash                       Gently with a scrungie or clean washcloth.  5.  Apply the CHG Soap to your body ONLY FROM THE NECK DOWN.   Do not use on face/ open                           Wound or open sores. Avoid contact with eyes, ears mouth and genitals (private parts).                       Wash face,  Genitals (private parts) with your normal soap.             6.  Wash thoroughly, paying special attention to the area where your surgery  will be performed.  7.  Thoroughly rinse your body with warm water from the neck down.  8.  DO NOT shower/wash with your normal soap after using and rinsing off  the CHG Soap.                9.  Pat yourself dry with a clean towel.            10.  Wear clean pajamas.            11.  Place clean sheets on your bed the night of your first shower and do not  sleep with pets. Day of Surgery : Do not apply any lotions/deodorants the morning of surgery.  Please wear clean clothes to the [facility]/surgery center.  FAILURE TO FOLLOW THESE INSTRUCTIONS MAY RESULT IN THE CANCELLATION OF YOUR SURGERY PATIENT SIGNATURE_________________________________  NURSE SIGNATURE__________________________________  ________________________________________________________________________   Adam Phenix  An incentive spirometer is a tool that can help keep your lungs clear and active. This tool measures how well you are filling your lungs with each breath.  Taking long deep breaths may help reverse or decrease the chance of developing breathing (pulmonary) problems (especially infection) following:  A long period of time when you are unable to move or be active. BEFORE THE PROCEDURE   If the spirometer includes an indicator to show your best effort, your nurse or respiratory therapist will set it to a desired goal.  If possible, sit up straight or lean slightly forward. Try not to slouch.  Hold the incentive spirometer in an upright position. INSTRUCTIONS FOR USE  1. Sit on the edge of your bed if possible, or sit up as far as you can in bed or on a chair. 2. Hold the incentive spirometer in an upright position. 3. Breathe out normally. 4. Place the mouthpiece in your mouth and seal your lips tightly around it. 5. Breathe in slowly and as deeply as possible, raising the piston or the ball toward the top of the column. 6. Hold your breath for 3-5 seconds or for as long as possible. Allow the piston or ball to fall to the bottom of the column. 7. Remove the mouthpiece from your mouth and breathe out normally. 8. Rest for a few seconds and repeat Steps 1 through 7 at least 10 times every 1-2 hours when you are awake. Take your time and take a few normal breaths between deep breaths. 9. The spirometer may include an indicator to show your best effort. Use the indicator as a goal to work toward during each repetition. 10. After each set of 10 deep breaths, practice coughing to be sure your lungs are clear. If you have an incision (the cut made at the time of surgery), support your incision when coughing by placing a pillow or  rolled up towels firmly against it. Once you are able to get out of bed, walk around indoors and cough well. You may stop using the incentive spirometer when instructed by your caregiver.  RISKS AND COMPLICATIONS  Take your time so you do not get dizzy or light-headed.  If you are in pain, you may need to take or ask for pain  medication before doing incentive spirometry. It is harder to take a deep breath if you are having pain. AFTER USE  Rest and breathe slowly and easily.  It can be helpful to keep track of a log of your progress. Your caregiver can provide you with a simple table to help with this. If you are using the spirometer at home, follow these instructions: [place] IF:   You are having difficultly using the spirometer.  You have trouble using the spirometer as often as instructed.  Your pain medication is not giving enough relief while using the spirometer.  You develop fever of 100.5 F (38.1 C) or higher. SEEK IMMEDIATE MEDICAL CARE IF:   You cough up bloody sputum that had not been present before.  You develop fever of 102 F (38.9 C) or greater.  You develop worsening pain at or near the incision site. MAKE SURE YOU:   Understand these instructions.  Will watch your condition.  Will get help right away if you are not doing well or get worse. Document Released: 10/30/2006 Document Revised: 09/11/2011 Document Reviewed: 12/31/2006 [facility] Patient Information 2014 [place], [place].   ________________________________________________________________________

## 2017-05-31 ENCOUNTER — Ambulatory Visit: Payer: Self-pay | Admitting: General Surgery

## 2017-05-31 DIAGNOSIS — M545 Low back pain: Secondary | ICD-10-CM | POA: Diagnosis not present

## 2017-05-31 DIAGNOSIS — I10 Essential (primary) hypertension: Secondary | ICD-10-CM | POA: Diagnosis not present

## 2017-05-31 NOTE — H&P (View-Only) (Signed)
Leslie Mason 05/31/2017 3:53 PM Location: [facility] Patient #: [id number] DOB: 11/13/1970 Single / Language: Len[place] Ponds[place] / Race: White Female  History of Present Illness Leslie Mason(Kmari Brian M. William Laske MD; 05/31/2017 6:47 PM) The patient is a 1Roosvelt Mason year old female who presents for a bariatric surgery evaluation. She comes in today for her preoperative visit. She has been approved for laparoscopic sleeve gastrectomy. She was found to have mild obstructive sleep apnea on sleep study. Otherwise she denies any medical changes. She is using CPAP. Her upper GI was unremarkable. No evidence of hiatal hernia. Bariatric evaluation labs revealed a slightly elevated LDL level of 111 and a hemoglobin A1c of 5.7. She is been having some bilateral Achilles discomfort. Otherwise she denies any chest pain, chest pressure, shortness of breath, dyspnea on exertion, reflux. She denies any abdominal pain    01/24/2017 She is referred by [name] for evaluation evaluation of weight loss surgery. She completed our seminar on line. She is interested in losing weight in order to have less joint pain and to be physically more active. She started gaining weight after her series of lower extremity bony fractures. Despite numerous attempts for sustained weight loss she has been unsuccessful. She has worked with the nutritionist on several different occasions, Weight Watchers, and prescription medications such as phentermine and contrave- all without any long-term success.  Her comorbidities include hypertension, chronic low back pain without sciatica, left heel pain, elevated blood glucose  She denies any chest pain, chest pressure, shortness of breath, paroxysmal nocturnal dyspnea, dyspnea on exertion. She does sleep on several pillows at night she believes that his for snoring. She has some issues with lower extremity edema in her left leg due to her Achilles tendon. She wakes up in the morning several times a week  with nasal drainage. Her stop bang score was elevated.  She denies any reflux, abdominal pain, diarrhea or constipation. She denies any melena or hematochezia. She has a bowel movement at least every other day but most days daily. She denies any dysuria, hematuria. She does have irregular menses and was was recently given a Deproprovera injection for this  She has fractured her right ankle and her right leg before. She has left Achilles pain and low back pain. She does have some occasional knee pain. She has elevated blood sugar. She denies any TIAs or amaurosis fugax. She denies any severe headaches on a frequent basis.  She denies any tobacco or drug use. She drinks alcohol on a very rare occasion. She works in [employer]ers [employer] for [employer]   Problem List/Past Medical Leslie Mason(Leslie Mason [name], MD; 05/31/2017 6:48 PM) PRE-BARIATRIC SURGERY NUTRITION EVALUATION (Z71.3) OBESITY, MORBID, BMI 50 OR HIGHER (E66.01) PREDIABETES (R73.03) OSA ON CPAP (G47.33) mild per sleep study  Past Surgical History Leslie Mason(Leslie Mason [name], MD; 05/31/2017 6:48 PM) Oral Surgery Shoulder Surgery Right.  Diagnostic Studies History Leslie Mason(Leslie Mason [name], MD; 05/31/2017 6:48 PM) Colonoscopy never Mammogram within last year  Allergies (Leslie A. Manson PasseyBrown, RMA; 05/31/2017 3:54 PM) No Known Drug Allergies 01/24/2017 Allergies Reconciled  Medication History Leslie Mason(Leslie Mason [name], MD; 05/31/2017 6:48 PM) Medications Reconciled OxyCODONE HCl (5MG /5ML Solution, 5-10 Milliliter Oral every four hours, as needed, Taken starting 05/31/2017) Active. Zofran ODT (4MG  Tablet Disint, 1 (one) Tablet Oral every six hours, as needed, Taken starting 05/31/2017) Active. (As needed for nasuea) Protonix (40MG  Tablet DR, 1 (one) Tablet Oral daily, Taken starting 05/31/2017) Active. MedroxyPROGESTERone Acetate (10MG  Tablet, Oral) Active. Bystolic (5MG  Tablet, Oral) Active.  Social History Leslie Mason(Leslie Mason M.  Andrey CampanileWilson, MD; 05/31/2017 6:48 PM) Alcohol use  Occasional alcohol use. No drug use  Family History Leslie Mason(Leslie Mason [name], MD; 05/31/2017 6:48 PM) Colon Cancer Father. Diabetes Mellitus Father.  Pregnancy / Birth History Leslie Mason(Leslie Mason [name], MD; 05/31/2017 6:48 PM) Age at menarche 11 years. Contraceptive History Oral contraceptives. Gravida 1 Maternal age 47-25 Para 0  Other Problems Leslie Mason(Leslie Mason [name], MD; 05/31/2017 6:48 PM) Asthma Chronic Obstructive Lung Disease CHRONIC MIDLINE LOW BACK PAIN WITHOUT SCIATICA (M54.5) HYPERTENSION, ESSENTIAL (I10)     Review of Systems Leslie Mason(Leslie Mason M. Leslie Tedrick MD; 05/31/2017 6:47 PM) General Not Present- Appetite Loss, Chills, Fatigue, Fever, Night Sweats, Weight Gain and Weight Loss. Skin Not Present- Change in Wart/Mole, Dryness, Hives, Jaundice, New Lesions, Non-Healing Wounds, Rash and Ulcer. HEENT Present- Seasonal Allergies and Wears glasses/contact lenses. Not Present- Earache, Hearing Loss, Hoarseness, Nose Bleed, Oral Ulcers, Ringing in the Ears, Sinus Pain, Sore Throat, Visual Disturbances and Yellow Eyes. Respiratory Present- Snoring. Not Present- Bloody sputum, Chronic Cough, Difficulty Breathing and Wheezing. Breast Not Present- Breast Mass, Breast Pain, Nipple Discharge and Skin Changes. Cardiovascular Not Present- Chest Pain, Difficulty Breathing Lying Down, Leg Cramps, Palpitations, Rapid Heart Rate, Shortness of Breath and Swelling of Extremities. Gastrointestinal Not Present- Abdominal Pain, Bloating, Bloody Stool, Change in Bowel Habits, Chronic diarrhea, Constipation, Difficulty Swallowing, Excessive gas, Gets full quickly at meals, Hemorrhoids, Indigestion, Nausea, Rectal Pain and Vomiting. Female Genitourinary Present- Frequency. Not Present- Nocturia, Painful Urination, Pelvic Pain and Urgency. Musculoskeletal Present- Back Pain and Swelling of Extremities. Not Present- Joint Pain, Joint Stiffness, Muscle Pain and Muscle Weakness. Neurological Not Present- Decreased Memory,  Fainting, Headaches, Numbness, Seizures, Tingling, Tremor, Trouble walking and Weakness. Psychiatric Not Present- Anxiety, Bipolar, Change in Sleep Pattern, Depression, Fearful and Frequent crying. Endocrine Not Present- Cold Intolerance, Excessive Hunger, Hair Changes, Heat Intolerance, Hot flashes and New Diabetes. Hematology Not Present- Blood Thinners, Easy Bruising, Excessive bleeding, Gland problems, HIV and Persistent Infections.  Vitals (Leslie A. Brown RMA; 05/31/2017 3:54 PM) 05/31/2017 3:53 PM Weight: 335.6 lb Height: 68in Body Surface Area: 2.55 m Body Mass Index: 51.03 kg/m  Temp.: 97.27F  Pulse: 75 (Regular)  BP: 132/86 (Sitting, Left Arm, Standard)      Physical Exam Leslie Mason(Sony Schlarb M. Ebrima Ranta MD; 05/31/2017 6:47 PM)  General Mental Status-Alert. General Appearance-Consistent with stated age. Hydration-Well hydrated. Voice-Normal.  Head and Neck Head-normocephalic, atraumatic with no lesions or palpable masses. Trachea-midline. Thyroid Gland Characteristics - normal size and consistency.  Eye Eyeball - Bilateral-Extraocular movements intact. Sclera/Conjunctiva - Bilateral-No scleral icterus.  ENMT Note: ears - normal external ears mouth - lips intact  Chest and Lung Exam Chest and lung exam reveals -quiet, even and easy respiratory effort with no use of accessory muscles and on auscultation, normal breath sounds, no adventitious sounds and normal vocal resonance. Inspection Chest Wall - Normal. Back - normal.  Breast - Did not examine.  Cardiovascular Cardiovascular examination reveals -normal heart sounds, regular rate and rhythm with no murmurs and normal pedal pulses bilaterally.  Abdomen Inspection Inspection of the abdomen reveals - No Hernias. Skin - Scar - no surgical scars. Palpation/Percussion Palpation and Percussion of the abdomen reveal - Soft, Non Tender, No Rebound tenderness, No Rigidity (guarding) and No  hepatosplenomegaly. Auscultation Auscultation of the abdomen reveals - Bowel sounds normal.  Peripheral Vascular Upper Extremity Palpation - Pulses bilaterally normal.  Neurologic Neurologic evaluation reveals -alert and oriented x 3 with no impairment of recent or remote memory. Mental Status-Normal.  Neuropsychiatric The patient's mood and affect are  described as -normal. Judgment and Insight-insight is appropriate concerning matters relevant to self.  Musculoskeletal Normal Exam - Left-Upper Extremity Strength Normal and Lower Extremity Strength Normal. Normal Exam - Right-Upper Extremity Strength Normal and Lower Extremity Strength Normal. Note: mild b/l knee crepitus  Lymphatic Head & Neck  General Head & Neck Lymphatics: Bilateral - Description - Normal. Axillary - Did not examine. Femoral & Inguinal - Did not examine.    Assessment & Plan Leslie Mason M. Tesslyn Baumert MD; 05/31/2017 6:48 PM)  OBESITY, MORBID, BMI 50 OR HIGHER (E66.01) Impression: The patient meets weight loss surgery criteria. I still think she is a good candidate for sleeve gastrectomy. We reviewed her preoperative workup. We discussed her labs. We discussed the importance of preoperative diet plan. We discussed the typical hospitalization and postoperative course. She has attended her postoperative class. She was given her postoperative prescriptions today. All of her questions were asked and answered.  Current Plans Pt Education - EMW_preopbariatric HYPERTENSION, ESSENTIAL (I10)  CHRONIC MIDLINE LOW BACK PAIN WITHOUT SCIATICA (M54.5)  OSA ON CPAP (G47.33) Story: mild per sleep study  PREDIABETES (R73.03)  Mary Sella. Andrey Campanile, MD, FACS General, Bariatric, & Minimally Invasive Surgery [facility], [place]

## 2017-05-31 NOTE — H&P (Signed)
Leslie Mason 05/31/2017 3:53 PM Location: [facility] Patient #: [id number] DOB: 11/13/1970 Single / Language: [place]onds[place] / Race: White Female  History of Present Illness Leslie Mason(Leslie Mason M. Leslie Frate Leslie Mason; 05/31/2017 6:47 PM) The patient is a 1[name] year year old female who presents for a bariatric surgery evaluation. She comes in today for her preoperative visit. She has been approved for laparoscopic sleeve gastrectomy. She was found to have mild obstructive sleep apnea on sleep study. Otherwise she denies any medical changes. She is using CPAP. Her upper GI was unremarkable. No evidence of hiatal hernia. Bariatric evaluation labs revealed a slightly elevated LDL level of 111 and a hemoglobin A1c of 5.7. She is been having some bilateral Achilles discomfort. Otherwise she denies any chest pain, chest pressure, shortness of breath, dyspnea on exertion, reflux. She denies any abdominal pain    01/24/2017 She is referred by Dr Leslie Mason for evaluation evaluation of weight loss surgery. She completed our seminar on line. She is interested in losing weight in order to have less joint pain and to be physically more active. She started gaining weight after her series of lower extremity bony fractures. Despite numerous attempts for sustained weight loss she has been unsuccessful. She has worked with the nutritionist on several different occasions, Weight Watchers, and prescription medications such as phentermine and contrave- all without any long-term success.  Her comorbidities include hypertension, chronic low back pain without sciatica, left heel pain, elevated blood glucose  She denies any chest pain, chest pressure, shortness of breath, paroxysmal nocturnal dyspnea, dyspnea on exertion. She does sleep on several pillows at night she believes that his for snoring. She has some issues with lower extremity edema in her left leg due to her Achilles tendon. She wakes up in the morning several times a week  with nasal drainage. Her stop bang score was elevated.  She denies any reflux, abdominal pain, diarrhea or constipation. She denies any melena or hematochezia. She has a bowel movement at least every other day but most days daily. She denies any dysuria, hematuria. She does have irregular menses and was was recently given a Deproprovera injection for this  She has fractured her right ankle and her right leg before. She has left Achilles pain and low back pain. She does have some occasional knee pain. She has elevated blood sugar. She denies any TIAs or amaurosis fugax. She denies any severe headaches on a frequent basis.  She denies any tobacco or drug use. She drinks alcohol on a very rare occasion. She works in [employer] for [employer]   Problem List/Past Medical Leslie Mason(Leslie Mason M. Leslie CampanileWilson, Leslie Mason; 05/31/2017 6:48 PM) PRE-BARIATRIC SURGERY NUTRITION EVALUATION (Z71.3) OBESITY, MORBID, BMI 50 OR HIGHER (E66.01) PREDIABETES (R73.03) OSA ON CPAP (G47.33) mild per sleep study  Past Surgical History Leslie Mason(Leslie Mason M. Leslie CampanileWilson, Leslie Mason; 05/31/2017 6:48 PM) Oral Surgery Shoulder Surgery Right.  Diagnostic Studies History Leslie Mason(Leslie Mason M. Leslie CampanileWilson, Leslie Mason; 05/31/2017 6:48 PM) Colonoscopy never Mammogram within last year  Allergies (Leslie A. Leslie PasseyBrown, Leslie Mason; 05/31/2017 3:54 PM) No Known Drug Allergies 01/24/2017 Allergies Reconciled  Medication History Leslie Mason(Leslie Mason M. Leslie CampanileWilson, Leslie Mason; 05/31/2017 6:48 PM) Medications Reconciled OxyCODONE HCl (5MG /5ML Solution, 5-10 Milliliter Oral every four hours, as needed, Taken starting 05/31/2017) Active. Zofran ODT (4MG  Tablet Disint, 1 (one) Tablet Oral every six hours, as needed, Taken starting 05/31/2017) Active. (As needed for nasuea) Protonix (40MG  Tablet DR, 1 (one) Tablet Oral daily, Taken starting 05/31/2017) Active. MedroxyPROGESTERone Acetate (10MG  Tablet, Oral) Active. Bystolic (5MG  Tablet, Oral) Active.  Social History Leslie Mason(Leslie Mason M.  Leslie CampanileWilson, Leslie Mason; 05/31/2017 6:48 PM) Alcohol use  Occasional alcohol use. No drug use  Family History Leslie Mason(Leslie Mason M. Leslie CampanileWilson, Leslie Mason; 05/31/2017 6:48 PM) Colon Cancer Father. Diabetes Mellitus Father.  Pregnancy / Birth History Leslie Mason(Leslie Mason M. Leslie CampanileWilson, Leslie Mason; 05/31/2017 6:48 PM) Age at menarche 11 years. Contraceptive History Oral contraceptives. Gravida 1 Maternal age 47-47 Para 0  Other Problems Leslie Mason(Ankith Edmonston M. Leslie CampanileWilson, Leslie Mason; 05/31/2017 6:48 PM) Asthma Chronic Obstructive Lung Disease CHRONIC MIDLINE LOW BACK PAIN WITHOUT SCIATICA (M54.5) HYPERTENSION, ESSENTIAL (I10)     Review of Systems Leslie Mason(Leslie Mason M. Leslie Mumme Leslie Mason; 05/31/2017 6:47 PM) General Not Present- Appetite Loss, Chills, Fatigue, Fever, Night Sweats, Weight Gain and Weight Loss. Skin Not Present- Change in Wart/Mole, Dryness, Hives, Jaundice, New Lesions, Non-Healing Wounds, Rash and Ulcer. HEENT Present- Seasonal Allergies and Wears glasses/contact lenses. Not Present- Earache, Hearing Loss, Hoarseness, Nose Bleed, Oral Ulcers, Ringing in the Ears, Sinus Pain, Sore Throat, Visual Disturbances and Yellow Eyes. Respiratory Present- Snoring. Not Present- Bloody sputum, Chronic Cough, Difficulty Breathing and Wheezing. Breast Not Present- Breast Mass, Breast Pain, Nipple Discharge and Skin Changes. Cardiovascular Not Present- Chest Pain, Difficulty Breathing Lying Down, Leg Cramps, Palpitations, Rapid Heart Rate, Shortness of Breath and Swelling of Extremities. Gastrointestinal Not Present- Abdominal Pain, Bloating, Bloody Stool, Change in Bowel Habits, Chronic diarrhea, Constipation, Difficulty Swallowing, Excessive gas, Gets full quickly at meals, Hemorrhoids, Indigestion, Nausea, Rectal Pain and Vomiting. Female Genitourinary Present- Frequency. Not Present- Nocturia, Painful Urination, Pelvic Pain and Urgency. Musculoskeletal Present- Back Pain and Swelling of Extremities. Not Present- Joint Pain, Joint Stiffness, Muscle Pain and Muscle Weakness. Neurological Not Present- Decreased Memory,  Fainting, Headaches, Numbness, Seizures, Tingling, Tremor, Trouble walking and Weakness. Psychiatric Not Present- Anxiety, Bipolar, Change in Sleep Pattern, Depression, Fearful and Frequent crying. Endocrine Not Present- Cold Intolerance, Excessive Hunger, Hair Changes, Heat Intolerance, Hot flashes and New Diabetes. Hematology Not Present- Blood Thinners, Easy Bruising, Excessive bleeding, Gland problems, HIV and Persistent Infections.  Vitals (Leslie Mason Leslie Mason; 05/31/2017 3:54 PM) 05/31/2017 3:53 PM Weight: 335.6 lb Height: 68in Body Surface Area: 2.55 m Body Mass Index: 51.03 kg/m  Temp.: 97.27F  Pulse: 75 (Regular)  BP: 132/86 (Sitting, Left Arm, Standard)      Physical Exam Leslie Mason(Suriya Kovarik M. Ltanya Bayley Leslie Mason; 05/31/2017 6:47 PM)  General Mental Status-Alert. General Appearance-Consistent with stated age. Hydration-Well hydrated. Voice-Normal.  Head and Neck Head-normocephalic, atraumatic with no lesions or palpable masses. Trachea-midline. Thyroid Gland Characteristics - normal size and consistency.  Eye Eyeball - Bilateral-Extraocular movements intact. Sclera/Conjunctiva - Bilateral-No scleral icterus.  ENMT Note: ears - normal external ears mouth - lips intact  Chest and Lung Exam Chest and lung exam reveals -quiet, even and easy respiratory effort with no use of accessory muscles and on auscultation, normal breath sounds, no adventitious sounds and normal vocal resonance. Inspection Chest Wall - Normal. Back - normal.  Breast - Did not examine.  Cardiovascular Cardiovascular examination reveals -normal heart sounds, regular rate and rhythm with no murmurs and normal pedal pulses bilaterally.  Abdomen Inspection Inspection of the abdomen reveals - No Hernias. Skin - Scar - no surgical scars. Palpation/Percussion Palpation and Percussion of the abdomen reveal - Soft, Non Tender, No Rebound tenderness, No Rigidity (guarding) and No  hepatosplenomegaly. Auscultation Auscultation of the abdomen reveals - Bowel sounds normal.  Peripheral Vascular Upper Extremity Palpation - Pulses bilaterally normal.  Neurologic Neurologic evaluation reveals -alert and oriented x 3 with no impairment of recent or remote memory. Mental Status-Normal.  Neuropsychiatric The patient's mood and affect are  described as -normal. Judgment and Insight-insight is appropriate concerning matters relevant to self.  Musculoskeletal Normal Exam - Left-Upper Extremity Strength Normal and Lower Extremity Strength Normal. Normal Exam - Right-Upper Extremity Strength Normal and Lower Extremity Strength Normal. Note: mild b/l knee crepitus  Lymphatic Head & Neck  General Head & Neck Lymphatics: Bilateral - Description - Normal. Axillary - Did not examine. Femoral & Inguinal - Did not examine.    Assessment & Plan Leslie Mason M. Delwin Raczkowski Leslie Mason; 05/31/2017 6:48 PM)  OBESITY, MORBID, BMI 50 OR HIGHER (E66.01) Impression: The patient meets weight loss surgery criteria. I still think she is a good candidate for sleeve gastrectomy. We reviewed her preoperative workup. We discussed her labs. We discussed the importance of preoperative diet plan. We discussed the typical hospitalization and postoperative course. She has attended her postoperative class. She was given her postoperative prescriptions today. All of her questions were asked and answered.  Current Plans Pt Education - EMW_preopbariatric HYPERTENSION, ESSENTIAL (I10)  CHRONIC MIDLINE LOW BACK PAIN WITHOUT SCIATICA (M54.5)  OSA ON CPAP (G47.33) Story: mild per sleep study  PREDIABETES (R73.03)  Mary Sella. Leslie Mason, FACS General, Bariatric, & Minimally Invasive Surgery [facility], [place]

## 2017-06-01 ENCOUNTER — Other Ambulatory Visit: Payer: Self-pay

## 2017-06-01 ENCOUNTER — Encounter (HOSPITAL_COMMUNITY)
Admission: RE | Admit: 2017-06-01 | Discharge: 2017-06-01 | Disposition: A | Discharge status: Home / Self Care | Payer: 59 | Source: Ambulatory Visit | Attending: General Surgery | Admitting: General Surgery

## 2017-06-01 ENCOUNTER — Encounter (HOSPITAL_COMMUNITY): Payer: Self-pay

## 2017-06-01 HISTORY — DX: Allergic rhinitis due to pollen: J30.1

## 2017-06-01 HISTORY — DX: Prediabetes: R73.03

## 2017-06-01 HISTORY — DX: Unspecified chronic bronchitis: J42

## 2017-06-01 HISTORY — DX: Sleep apnea, unspecified: G47.30

## 2017-06-01 HISTORY — DX: Family history of other specified conditions: Z84.89

## 2017-06-01 LAB — COMPREHENSIVE METABOLIC PANEL
ALT: 18 U/L (ref 14–54)
ANION GAP: 7 (ref 5–15)
AST: 22 U/L (ref 15–41)
Albumin: 4.1 g/dL (ref 3.5–5.0)
Alkaline Phosphatase: 93 U/L (ref 38–126)
BILIRUBIN TOTAL: 0.8 mg/dL (ref 0.3–1.2)
BUN: 20 mg/dL (ref 6–20)
CHLORIDE: 106 mmol/L (ref 101–111)
CO2: 25 mmol/L (ref 22–32)
Calcium: 9.5 mg/dL (ref 8.9–10.3)
Creatinine, Ser: 0.93 mg/dL (ref 0.44–1.00)
Glucose, Bld: 89 mg/dL (ref 65–99)
POTASSIUM: 4.6 mmol/L (ref 3.5–5.1)
Sodium: 138 mmol/L (ref 135–145)
TOTAL PROTEIN: 7.7 g/dL (ref 6.5–8.1)

## 2017-06-01 LAB — HEMOGLOBIN A1C
HEMOGLOBIN A1C: 5.7 % — AB (ref 4.8–5.6)
MEAN PLASMA GLUCOSE: 116.89 mg/dL

## 2017-06-01 LAB — CBC WITH DIFFERENTIAL/PLATELET
BASOS ABS: 0 10*3/uL (ref 0.0–0.1)
Basophils Relative: 0 %
EOS PCT: 2 %
Eosinophils Absolute: 0.1 10*3/uL (ref 0.0–0.7)
HEMATOCRIT: 39.8 % (ref 36.0–46.0)
Hemoglobin: 12.5 g/dL (ref 12.0–15.0)
LYMPHS PCT: 35 %
Lymphs Abs: 2 10*3/uL (ref 0.7–4.0)
MCH: 26 pg (ref 26.0–34.0)
MCHC: 31.4 g/dL (ref 30.0–36.0)
MCV: 82.7 fL (ref 78.0–100.0)
MONO ABS: 0.4 10*3/uL (ref 0.1–1.0)
MONOS PCT: 6 %
NEUTROS ABS: 3.3 10*3/uL (ref 1.7–7.7)
Neutrophils Relative %: 57 %
PLATELETS: 218 10*3/uL (ref 150–400)
RBC: 4.81 MIL/uL (ref 3.87–5.11)
RDW: 14.7 % (ref 11.5–15.5)
WBC: 5.8 10*3/uL (ref 4.0–10.5)

## 2017-06-04 ENCOUNTER — Encounter (HOSPITAL_COMMUNITY)
Admission: RE | Disposition: A | Discharge status: Home / Self Care | Payer: Self-pay | Source: Ambulatory Visit | Attending: General Surgery

## 2017-06-04 ENCOUNTER — Encounter (HOSPITAL_COMMUNITY): Payer: Self-pay | Admitting: Emergency Medicine

## 2017-06-04 ENCOUNTER — Other Ambulatory Visit: Payer: Self-pay

## 2017-06-04 ENCOUNTER — Inpatient Hospital Stay (HOSPITAL_COMMUNITY): Payer: 59 | Admitting: Anesthesiology

## 2017-06-04 ENCOUNTER — Inpatient Hospital Stay (HOSPITAL_COMMUNITY)
Admission: RE | Admit: 2017-06-04 | Discharge: 2017-06-06 | DRG: 621 | Disposition: A | Discharge status: Home / Self Care | Payer: 59 | Source: Ambulatory Visit | Attending: General Surgery | Admitting: General Surgery

## 2017-06-04 DIAGNOSIS — M545 Low back pain: Secondary | ICD-10-CM | POA: Diagnosis present

## 2017-06-04 DIAGNOSIS — G8929 Other chronic pain: Secondary | ICD-10-CM | POA: Diagnosis present

## 2017-06-04 DIAGNOSIS — R7303 Prediabetes: Secondary | ICD-10-CM | POA: Diagnosis present

## 2017-06-04 DIAGNOSIS — G4733 Obstructive sleep apnea (adult) (pediatric): Secondary | ICD-10-CM | POA: Diagnosis present

## 2017-06-04 DIAGNOSIS — I10 Essential (primary) hypertension: Secondary | ICD-10-CM | POA: Diagnosis not present

## 2017-06-04 DIAGNOSIS — Z79899 Other long term (current) drug therapy: Secondary | ICD-10-CM | POA: Diagnosis not present

## 2017-06-04 DIAGNOSIS — K449 Diaphragmatic hernia without obstruction or gangrene: Secondary | ICD-10-CM | POA: Diagnosis not present

## 2017-06-04 DIAGNOSIS — Z6841 Body Mass Index (BMI) 40.0 and over, adult: Secondary | ICD-10-CM | POA: Diagnosis not present

## 2017-06-04 DIAGNOSIS — J45909 Unspecified asthma, uncomplicated: Secondary | ICD-10-CM | POA: Diagnosis present

## 2017-06-04 DIAGNOSIS — J449 Chronic obstructive pulmonary disease, unspecified: Secondary | ICD-10-CM | POA: Diagnosis present

## 2017-06-04 DIAGNOSIS — Z9981 Dependence on supplemental oxygen: Secondary | ICD-10-CM | POA: Diagnosis not present

## 2017-06-04 DIAGNOSIS — Z6838 Body mass index (BMI) 38.0-38.9, adult: Secondary | ICD-10-CM | POA: Diagnosis present

## 2017-06-04 DIAGNOSIS — Z9884 Bariatric surgery status: Secondary | ICD-10-CM

## 2017-06-04 DIAGNOSIS — K2950 Unspecified chronic gastritis without bleeding: Secondary | ICD-10-CM | POA: Diagnosis not present

## 2017-06-04 HISTORY — PX: LAPAROSCOPIC GASTRIC SLEEVE RESECTION: SHX5895

## 2017-06-04 LAB — PREGNANCY, URINE: PREG TEST UR: NEGATIVE

## 2017-06-04 LAB — HEMOGLOBIN AND HEMATOCRIT, BLOOD
HEMATOCRIT: 44.6 % (ref 36.0–46.0)
HEMOGLOBIN: 13.9 g/dL (ref 12.0–15.0)

## 2017-06-04 SURGERY — GASTRECTOMY, SLEEVE, LAPAROSCOPIC
Anesthesia: General | Site: Abdomen

## 2017-06-04 MED ORDER — FENTANYL CITRATE (PF) 250 MCG/5ML IJ SOLN
INTRAMUSCULAR | Status: AC
  Filled 2017-06-04: qty 5

## 2017-06-04 MED ORDER — HYDROMORPHONE HCL 1 MG/ML IJ SOLN
INTRAMUSCULAR | Status: AC
Start: 2017-06-04 — End: 2017-06-05
  Filled 2017-06-04: qty 1

## 2017-06-04 MED ORDER — EPHEDRINE 5 MG/ML INJ
INTRAVENOUS | Status: AC
  Filled 2017-06-04: qty 10

## 2017-06-04 MED ORDER — ALBUTEROL SULFATE (2.5 MG/3ML) 0.083% IN NEBU: 2.5000 mg | INHALATION_SOLUTION | Freq: Four times a day (QID) | RESPIRATORY_TRACT | Status: DC | PRN

## 2017-06-04 MED ORDER — LABETALOL HCL 5 MG/ML IV SOLN
10.0000 mg | INTRAVENOUS | Status: DC | PRN
  Administered 2017-06-04: 10 mg via INTRAVENOUS

## 2017-06-04 MED ORDER — SCOPOLAMINE 1 MG/3DAYS TD PT72
1.0000 | MEDICATED_PATCH | TRANSDERMAL | Status: DC
  Administered 2017-06-04: 1.5 mg via TRANSDERMAL
  Filled 2017-06-04: qty 1

## 2017-06-04 MED ORDER — DIPHENHYDRAMINE HCL 50 MG/ML IJ SOLN: 12.5000 mg | Freq: Three times a day (TID) | INTRAMUSCULAR | Status: DC | PRN

## 2017-06-04 MED ORDER — ONDANSETRON HCL 4 MG/2ML IJ SOLN
4.0000 mg | Freq: Four times a day (QID) | INTRAMUSCULAR | Status: DC | PRN
  Administered 2017-06-05 (×2): 4 mg via INTRAVENOUS
  Filled 2017-06-04 (×3): qty 2

## 2017-06-04 MED ORDER — DEXAMETHASONE SODIUM PHOSPHATE 10 MG/ML IJ SOLN
INTRAMUSCULAR | Status: DC | PRN
Start: 2017-06-04 — End: 2017-06-04
  Administered 2017-06-04: 10 mg via INTRAVENOUS

## 2017-06-04 MED ORDER — SODIUM CHLORIDE 0.9 % IJ SOLN
INTRAMUSCULAR | Status: DC | PRN
  Administered 2017-06-04: 50 mL

## 2017-06-04 MED ORDER — ROCURONIUM BROMIDE 50 MG/5ML IV SOSY
PREFILLED_SYRINGE | INTRAVENOUS | Status: AC
  Filled 2017-06-04: qty 5

## 2017-06-04 MED ORDER — LIDOCAINE 2% (20 MG/ML) 5 ML SYRINGE
INTRAMUSCULAR | Status: DC | PRN
  Administered 2017-06-04: 1.5 mg/kg/h via INTRAVENOUS

## 2017-06-04 MED ORDER — APREPITANT 40 MG PO CAPS
40.0000 mg | ORAL_CAPSULE | ORAL | Status: AC
Start: 2017-06-04 — End: 2017-06-04
  Administered 2017-06-04: 40 mg via ORAL
  Filled 2017-06-04 (×2): qty 1

## 2017-06-04 MED ORDER — KCL IN DEXTROSE-NACL 20-5-0.45 MEQ/L-%-% IV SOLN
INTRAVENOUS | Status: DC
  Administered 2017-06-04: 17:00:00 via INTRAVENOUS
  Administered 2017-06-05: 1000 mL via INTRAVENOUS
  Administered 2017-06-06: via INTRAVENOUS
  Filled 2017-06-04 (×5): qty 1000

## 2017-06-04 MED ORDER — GABAPENTIN 300 MG PO CAPS
300.0000 mg | ORAL_CAPSULE | ORAL | Status: AC
  Administered 2017-06-04: 300 mg via ORAL
  Filled 2017-06-04: qty 1

## 2017-06-04 MED ORDER — PREMIER PROTEIN SHAKE
2.0000 [oz_av] | ORAL | Status: DC
  Administered 2017-06-06: 2 [oz_av] via ORAL

## 2017-06-04 MED ORDER — 0.9 % SODIUM CHLORIDE (POUR BTL) OPTIME
TOPICAL | Status: DC | PRN
  Administered 2017-06-04: 1000 mL

## 2017-06-04 MED ORDER — PROMETHAZINE HCL 25 MG/ML IJ SOLN
12.5000 mg | Freq: Four times a day (QID) | INTRAMUSCULAR | Status: DC | PRN
  Administered 2017-06-05: 12.5 mg via INTRAVENOUS
  Filled 2017-06-04: qty 1

## 2017-06-04 MED ORDER — PROPOFOL 10 MG/ML IV BOLUS
INTRAVENOUS | Status: AC
  Filled 2017-06-04: qty 20

## 2017-06-04 MED ORDER — ACETAMINOPHEN 160 MG/5ML PO SOLN
650.0000 mg | Freq: Four times a day (QID) | ORAL | Status: DC
  Administered 2017-06-04 – 2017-06-06 (×7): 650 mg via ORAL
  Filled 2017-06-04 (×8): qty 20.3

## 2017-06-04 MED ORDER — HYDROMORPHONE HCL 1 MG/ML IJ SOLN
0.2500 mg | INTRAMUSCULAR | Status: DC | PRN
  Administered 2017-06-04 (×2): 0.5 mg via INTRAVENOUS

## 2017-06-04 MED ORDER — LIDOCAINE 2% (20 MG/ML) 5 ML SYRINGE
INTRAMUSCULAR | Status: AC
  Filled 2017-06-04: qty 5

## 2017-06-04 MED ORDER — LABETALOL HCL 5 MG/ML IV SOLN
INTRAVENOUS | Status: AC
  Filled 2017-06-04: qty 4

## 2017-06-04 MED ORDER — MEPERIDINE HCL 50 MG/ML IJ SOLN: 6.2500 mg | INTRAMUSCULAR | Status: DC | PRN

## 2017-06-04 MED ORDER — MIDAZOLAM HCL 2 MG/2ML IJ SOLN
INTRAMUSCULAR | Status: AC
  Filled 2017-06-04: qty 2

## 2017-06-04 MED ORDER — SIMETHICONE 80 MG PO CHEW
80.0000 mg | CHEWABLE_TABLET | Freq: Four times a day (QID) | ORAL | Status: DC | PRN
  Administered 2017-06-04 – 2017-06-05 (×2): 80 mg via ORAL
  Filled 2017-06-04 (×2): qty 1

## 2017-06-04 MED ORDER — ACETAMINOPHEN 500 MG PO TABS
1000.0000 mg | ORAL_TABLET | ORAL | Status: AC
  Administered 2017-06-04: 1000 mg via ORAL
  Filled 2017-06-04: qty 2

## 2017-06-04 MED ORDER — KETAMINE HCL 10 MG/ML IJ SOLN
INTRAMUSCULAR | Status: DC | PRN
  Administered 2017-06-04: 30 mg via INTRAVENOUS

## 2017-06-04 MED ORDER — LIDOCAINE 2% (20 MG/ML) 5 ML SYRINGE
INTRAMUSCULAR | Status: DC | PRN
  Administered 2017-06-04: 25 mg via INTRAVENOUS

## 2017-06-04 MED ORDER — OXYCODONE HCL 5 MG/5ML PO SOLN: 5.0000 mg | ORAL | Status: DC | PRN

## 2017-06-04 MED ORDER — ALBUTEROL SULFATE HFA 108 (90 BASE) MCG/ACT IN AERS: 2.0000 | INHALATION_SPRAY | Freq: Four times a day (QID) | RESPIRATORY_TRACT | Status: DC | PRN

## 2017-06-04 MED ORDER — SUGAMMADEX SODIUM 500 MG/5ML IV SOLN
INTRAVENOUS | Status: DC | PRN
  Administered 2017-06-04: 300 mg via INTRAVENOUS

## 2017-06-04 MED ORDER — GABAPENTIN 250 MG/5ML PO SOLN
200.0000 mg | Freq: Two times a day (BID) | ORAL | Status: DC
  Administered 2017-06-04 – 2017-06-06 (×4): 200 mg via ORAL
  Filled 2017-06-04 (×4): qty 4

## 2017-06-04 MED ORDER — PANTOPRAZOLE SODIUM 40 MG IV SOLR
40.0000 mg | Freq: Every day | INTRAVENOUS | Status: DC
  Administered 2017-06-04 – 2017-06-05 (×2): 40 mg via INTRAVENOUS
  Filled 2017-06-04 (×2): qty 40

## 2017-06-04 MED ORDER — LACTATED RINGERS IR SOLN
Status: DC | PRN
  Administered 2017-06-04: 1000 mL

## 2017-06-04 MED ORDER — SUGAMMADEX SODIUM 500 MG/5ML IV SOLN
INTRAVENOUS | Status: AC
  Filled 2017-06-04: qty 5

## 2017-06-04 MED ORDER — LACTATED RINGERS IV SOLN
INTRAVENOUS | Status: DC
  Administered 2017-06-04: 08:00:00 via INTRAVENOUS

## 2017-06-04 MED ORDER — FENTANYL CITRATE (PF) 100 MCG/2ML IJ SOLN
INTRAMUSCULAR | Status: DC | PRN
  Administered 2017-06-04 (×3): 50 ug via INTRAVENOUS
  Administered 2017-06-04: 100 ug via INTRAVENOUS

## 2017-06-04 MED ORDER — MORPHINE SULFATE (PF) 2 MG/ML IV SOLN
1.0000 mg | INTRAVENOUS | Status: DC | PRN
  Administered 2017-06-04: 2 mg via INTRAVENOUS
  Filled 2017-06-04: qty 1

## 2017-06-04 MED ORDER — HEPARIN SODIUM (PORCINE) 5000 UNIT/ML IJ SOLN
5000.0000 [IU] | INTRAMUSCULAR | Status: AC
  Administered 2017-06-04: 5000 [IU] via SUBCUTANEOUS
  Filled 2017-06-04 (×2): qty 1

## 2017-06-04 MED ORDER — DEXAMETHASONE SODIUM PHOSPHATE 4 MG/ML IJ SOLN
4.0000 mg | INTRAMUSCULAR | Status: DC
  Filled 2017-06-04: qty 1

## 2017-06-04 MED ORDER — NEBIVOLOL HCL 5 MG PO TABS
5.0000 mg | ORAL_TABLET | Freq: Every day | ORAL | Status: DC
  Administered 2017-06-05 – 2017-06-06 (×2): 5 mg via ORAL
  Filled 2017-06-04 (×2): qty 1

## 2017-06-04 MED ORDER — ENOXAPARIN SODIUM 30 MG/0.3ML ~~LOC~~ SOLN
30.0000 mg | Freq: Two times a day (BID) | SUBCUTANEOUS | Status: DC
  Administered 2017-06-04 – 2017-06-06 (×4): 30 mg via SUBCUTANEOUS
  Filled 2017-06-04 (×4): qty 0.3

## 2017-06-04 MED ORDER — BUPIVACAINE LIPOSOME 1.3 % IJ SUSP
20.0000 mL | Freq: Once | INTRAMUSCULAR | Status: AC
  Administered 2017-06-04: 20 mL
  Filled 2017-06-04: qty 20

## 2017-06-04 MED ORDER — HYDRALAZINE HCL 20 MG/ML IJ SOLN
20.0000 mg | INTRAMUSCULAR | Status: DC | PRN
  Administered 2017-06-04 – 2017-06-06 (×3): 20 mg via INTRAVENOUS
  Filled 2017-06-04 (×3): qty 1

## 2017-06-04 MED ORDER — SODIUM CHLORIDE 0.9 % IJ SOLN
INTRAMUSCULAR | Status: AC
  Filled 2017-06-04: qty 50

## 2017-06-04 MED ORDER — CHLORHEXIDINE GLUCONATE 4 % EX LIQD: 60.0000 mL | Freq: Once | CUTANEOUS | Status: DC

## 2017-06-04 MED ORDER — DEXAMETHASONE SODIUM PHOSPHATE 10 MG/ML IJ SOLN
INTRAMUSCULAR | Status: AC
  Filled 2017-06-04: qty 1

## 2017-06-04 MED ORDER — CEFOTETAN DISODIUM-DEXTROSE 2-2.08 GM-%(50ML) IV SOLR
2.0000 g | INTRAVENOUS | Status: AC
  Administered 2017-06-04: 2 g via INTRAVENOUS
  Filled 2017-06-04: qty 50

## 2017-06-04 MED ORDER — SUCCINYLCHOLINE CHLORIDE 200 MG/10ML IV SOSY
PREFILLED_SYRINGE | INTRAVENOUS | Status: DC | PRN
  Administered 2017-06-04: 160 mg via INTRAVENOUS

## 2017-06-04 MED ORDER — PROPOFOL 10 MG/ML IV BOLUS
INTRAVENOUS | Status: DC | PRN
  Administered 2017-06-04: 180 mg via INTRAVENOUS

## 2017-06-04 MED ORDER — PROMETHAZINE HCL 25 MG/ML IJ SOLN: 6.2500 mg | INTRAMUSCULAR | Status: DC | PRN

## 2017-06-04 MED ORDER — KETOROLAC TROMETHAMINE 30 MG/ML IJ SOLN: 30.0000 mg | Freq: Once | INTRAMUSCULAR | Status: DC | PRN

## 2017-06-04 MED ORDER — MIDAZOLAM HCL 5 MG/5ML IJ SOLN
INTRAMUSCULAR | Status: DC | PRN
  Administered 2017-06-04: 2 mg via INTRAVENOUS

## 2017-06-04 MED ORDER — STERILE WATER FOR IRRIGATION IR SOLN
Status: DC | PRN
  Administered 2017-06-04: 1000 mL

## 2017-06-04 MED ORDER — ROCURONIUM BROMIDE 10 MG/ML (PF) SYRINGE
PREFILLED_SYRINGE | INTRAVENOUS | Status: DC | PRN
  Administered 2017-06-04: 10 mg via INTRAVENOUS
  Administered 2017-06-04: 5 mg via INTRAVENOUS
  Administered 2017-06-04: 45 mg via INTRAVENOUS

## 2017-06-04 SURGICAL SUPPLY — 62 items
APPLICATOR COTTON TIP 6IN STRL (MISCELLANEOUS) IMPLANT
APPLIER CLIP ROT 10 11.4 M/L (STAPLE)
APPLIER CLIP ROT 13.4 12 LRG (CLIP)
BANDAGE ADH SHEER 1  50/CT (GAUZE/BANDAGES/DRESSINGS) IMPLANT
BENZOIN TINCTURE PRP APPL 2/3 (GAUZE/BANDAGES/DRESSINGS) ×2 IMPLANT
BLADE SURG SZ11 CARB STEEL (BLADE) ×2 IMPLANT
CABLE HIGH FREQUENCY MONO STRZ (ELECTRODE) IMPLANT
CHLORAPREP W/TINT 26ML (MISCELLANEOUS) ×4 IMPLANT
CLIP APPLIE ROT 10 11.4 M/L (STAPLE) IMPLANT
CLIP APPLIE ROT 13.4 12 LRG (CLIP) IMPLANT
DECANTER SPIKE VIAL GLASS SM (MISCELLANEOUS) ×2 IMPLANT
DEVICE SUT QUICK LOAD TK 5 (STAPLE) IMPLANT
DEVICE SUT TI-KNOT TK 5X26 (MISCELLANEOUS) IMPLANT
DEVICE SUTURE ENDOST 10MM (ENDOMECHANICALS) ×2 IMPLANT
DRAPE UTILITY XL STRL (DRAPES) ×4 IMPLANT
ELECT L-HOOK LAP 45CM DISP (ELECTROSURGICAL) ×2
ELECT PENCIL ROCKER SW 15FT (MISCELLANEOUS) IMPLANT
ELECT REM PT RETURN 15FT ADLT (MISCELLANEOUS) ×2 IMPLANT
ELECTRODE L-HOOK LAP 45CM DISP (ELECTROSURGICAL) ×1 IMPLANT
GAUZE SPONGE 2X2 8PLY STRL LF (GAUZE/BANDAGES/DRESSINGS) IMPLANT
GAUZE SPONGE 4X4 12PLY STRL (GAUZE/BANDAGES/DRESSINGS) IMPLANT
GLOVE BIO SURGEON STRL SZ7.5 (GLOVE) ×2 IMPLANT
GLOVE INDICATOR 8.0 STRL GRN (GLOVE) ×2 IMPLANT
GOWN STRL REUS W/TWL XL LVL3 (GOWN DISPOSABLE) ×6 IMPLANT
GRASPER SUT TROCAR 14GX15 (MISCELLANEOUS) IMPLANT
HOVERMATT SINGLE USE (MISCELLANEOUS) ×2 IMPLANT
KIT BASIN OR (CUSTOM PROCEDURE TRAY) ×2 IMPLANT
MARKER SKIN DUAL TIP RULER LAB (MISCELLANEOUS) ×2 IMPLANT
NEEDLE SPNL 22GX3.5 QUINCKE BK (NEEDLE) ×2 IMPLANT
PACK UNIVERSAL I (CUSTOM PROCEDURE TRAY) ×2 IMPLANT
RELOAD STAPLER BLUE 60MM (STAPLE) ×3 IMPLANT
RELOAD STAPLER GOLD 60MM (STAPLE) ×1 IMPLANT
RELOAD STAPLER GREEN 60MM (STAPLE) ×1 IMPLANT
SCISSORS LAP 5X45 EPIX DISP (ENDOMECHANICALS) ×2 IMPLANT
SEALANT SURGICAL APPL DUAL CAN (MISCELLANEOUS) IMPLANT
SET IRRIG TUBING LAPAROSCOPIC (IRRIGATION / IRRIGATOR) ×2 IMPLANT
SHEARS HARMONIC ACE PLUS 45CM (MISCELLANEOUS) ×2 IMPLANT
SLEEVE GASTRECTOMY 40FR VISIGI (MISCELLANEOUS) ×2 IMPLANT
SLEEVE XCEL OPT CAN 5 100 (ENDOMECHANICALS) ×6 IMPLANT
SOLUTION ANTI FOG 6CC (MISCELLANEOUS) ×2 IMPLANT
SPONGE GAUZE 2X2 STER 10/PKG (GAUZE/BANDAGES/DRESSINGS)
SPONGE LAP 18X18 X RAY DECT (DISPOSABLE) ×2 IMPLANT
STAPLER ECHELON BIOABSB 60 FLE (MISCELLANEOUS) ×6 IMPLANT
STAPLER ECHELON LONG 60 440 (INSTRUMENTS) IMPLANT
STAPLER RELOAD BLUE 60MM (STAPLE) ×6
STAPLER RELOAD GOLD 60MM (STAPLE) ×2
STAPLER RELOAD GREEN 60MM (STAPLE) ×2
STRIP CLOSURE SKIN 1/2X4 (GAUZE/BANDAGES/DRESSINGS) ×2 IMPLANT
SUT MNCRL AB 4-0 PS2 18 (SUTURE) ×2 IMPLANT
SUT SURGIDAC NAB ES-9 0 48 120 (SUTURE) ×2 IMPLANT
SUT VICRYL 0 TIES 12 18 (SUTURE) ×2 IMPLANT
SYR 20CC LL (SYRINGE) ×2 IMPLANT
SYR 50ML LL SCALE MARK (SYRINGE) ×2 IMPLANT
TOWEL OR 17X26 10 PK STRL BLUE (TOWEL DISPOSABLE) ×2 IMPLANT
TOWEL OR NON WOVEN STRL DISP B (DISPOSABLE) ×2 IMPLANT
TRAY FOLEY W/METER SILVER 16FR (SET/KITS/TRAYS/PACK) IMPLANT
TROCAR BLADELESS 15MM (ENDOMECHANICALS) ×2 IMPLANT
TROCAR BLADELESS OPT 5 100 (ENDOMECHANICALS) ×2 IMPLANT
TUBE CALIBRATION LAPBAND (TUBING) ×2 IMPLANT
TUBING CONNECTING 10 (TUBING) ×4 IMPLANT
TUBING ENDO SMARTCAP (MISCELLANEOUS) ×2 IMPLANT
TUBING INSUF HEATED (TUBING) ×2 IMPLANT

## 2017-06-04 NOTE — Anesthesia Preprocedure Evaluation (Addendum)
Anesthesia Evaluation  Patient identified by MRN, date of birth, ID band Patient awake    Reviewed: Allergy & Precautions, NPO status , Patient's Chart, lab work & pertinent test results  Airway Mallampati: I       Dental no notable dental hx. (+) Teeth Intact   Pulmonary    breath sounds clear to auscultation       Cardiovascular hypertension, Pt. on home beta blockers Normal cardiovascular exam Rhythm:Regular Rate:Normal     Neuro/Psych negative neurological ROS  negative psych ROS   GI/Hepatic negative GI ROS, Neg liver ROS,   Endo/Other  negative endocrine ROSMorbid obesity  Renal/GU negative Renal ROS  negative genitourinary   Musculoskeletal negative musculoskeletal ROS (+)   Abdominal (+) + obese,   Peds  Hematology   Anesthesia Other Findings   Reproductive/Obstetrics negative OB ROS                            Anesthesia Physical Anesthesia Plan  ASA: III  Anesthesia Plan: General   Post-op Pain Management:    Induction: Intravenous  PONV Risk Score and Plan: 4 or greater and Scopolamine patch - Pre-op, Dexamethasone, Ondansetron and Midazolam  Airway Management Planned: Oral ETT  Additional Equipment:   Intra-op Plan:   Post-operative Plan: Extubation in OR  Informed Consent: I have reviewed the patients History and Physical, chart, labs and discussed the procedure including the risks, benefits and alternatives for the proposed anesthesia with the patient or authorized representative who has indicated his/her understanding and acceptance.   Dental advisory given  Plan Discussed with: CRNA and Surgeon  Anesthesia Plan Comments:         Anesthesia Quick Evaluation

## 2017-06-04 NOTE — Progress Notes (Signed)
Discussed post op day goals with patient including ambulation, IS, diet progression, pain, and nausea control.  Questions answered. 

## 2017-06-04 NOTE — Discharge Instructions (Signed)
° ° ° °GASTRIC BYPASS/SLEEVE ° Home Care Instructions ° ° These instructions are to help you care for yourself when you go home. ° °Call: If you have any problems. °• Call [phone] and ask for the surgeon on call °• If you need immediate assistance come to the ER at [facility]. Tell the ER staff you are a new post-op gastric bypass or gastric sleeve patient  °Signs and symptoms to report: • Severe  vomiting or nausea °o If you cannot handle clear liquids for longer than 1 day, call your surgeon °• Abdominal pain which does not get better after taking your pain medication °• Fever greater than 100.4°  F and chills °• Heart rate over 100 beats a minute °• Trouble breathing °• Chest pain °• Redness,  swelling, drainage, or foul odor at incision (surgical) sites °• If your incisions open or pull apart °• Swelling or pain in calf (lower leg) °• Diarrhea (Loose bowel movements that happen often), frequent watery, uncontrolled bowel movements °• Constipation, (no bowel movements for 3 days) if this happens: °o Take Milk of Magnesia, 2 tablespoons by mouth, 3 times a day for 2 days if needed °o Stop taking Milk of Magnesia once you have had a bowel movement °o Call your doctor if constipation continues °Or °o Take Miralax  (instead of Milk of Magnesia) following the label instructions °o Stop taking Miralax once you have had a bowel movement °o Call your doctor if constipation continues °• Anything you think is “abnormal for you” °  °Normal side effects after surgery: • Unable to sleep at night or unable to concentrate °• Irritability °• Being tearful (crying) or depressed ° °These are common complaints, possibly related to your anesthesia, stress of surgery, and change in lifestyle, that usually go away a few weeks after surgery. If these feelings continue, call your medical doctor.  °Wound Care: You may have surgical glue, steri-strips, or staples over your incisions after surgery °• Surgical glue: Looks like clear  film over your incisions and will wear off a little at a time °• Steri-strips: Adhesive strips of tape over your incisions. You may notice a yellowish color on skin under the steri-strips. This is used to make the steri-strips stick better. Do not pull the steri-strips off - let them fall off °• Staples: Staples may be removed before you leave the hospital °o If you go home with staples, call [facility] for an appointment with your surgeon’s nurse to have staples removed 10 days after surgery, [phone] °• Showering: You may shower two (2) days after your surgery unless your surgeon tells you differently °o Wash gently around incisions with warm soapy water, rinse well, and gently pat dry °o If you have a drain (tube from your incision), you may need someone to hold this while you shower °o No tub baths until staples are removed and incisions are healed °  °Medications: • Medications should be liquid or crushed if larger than the size of a dime °• Extended release pills (medication that releases a little bit at a time through the  day) should not be crushed °• Depending on the size and number of medications you take, you may need to space (take a few throughout the day)/change the time you take your medications so that you do not over-fill your pouch (smaller stomach) °• Make sure you follow-up with you primary care physician to make medication changes needed during rapid weight loss and life -style changes °•   If you have diabetes, follow up with your doctor that orders your diabetes medication(s) within one week after surgery and check your blood sugar regularly ° °• Do not drive while taking narcotics (pain medications) ° °• Do not take acetaminophen (Tylenol) and Roxicet or Lortab Elixir at the same time since these pain medications contain acetaminophen °  °Diet:  °First 2 Weeks You will see the nutritionist about two (2) weeks after your surgery. The nutritionist will increase the types of  foods you can eat if you are handling liquids well: °• If you have severe vomiting or nausea and cannot handle clear liquids lasting longer than 1 day call your surgeon °Protein Shake °• Drink at least 2 ounces of shake 5-6 times per day °• Each serving of protein shakes (usually 8-12 ounces) should have a minimum of: °o 15 grams of protein °o And no more than 5 grams of carbohydrate °• Goal for protein each day: °o Men = 80 grams per day °o Women = 60 grams per day °  ° • Protein powder may be added to fluids such as non-fat milk or Lactaid milk or Soy milk (limit to 35 grams added protein powder per serving) ° °Hydration °• Slowly increase the amount of water and other clear liquids as tolerated (See Acceptable Fluids) °• Slowly increase the amount of protein shake as tolerated °• Sip fluids slowly and throughout the day °• May use sugar substitutes in small amounts (no more than 6-8 packets per day; i.e. Splenda) ° °Fluid Goal °• The first goal is to drink at least 8 ounces of protein shake/drink per day (or as directed by the nutritionist); some examples of protein shakes are Syntrax Nectar, Adkins Advantage, EAS Edge HP, and Unjury. - See handout from pre-op Bariatric Education Class: °o Slowly increase the amount of protein shake you drink as tolerated °o You may find it easier to slowly sip shakes throughout the day °o It is important to get your proteins in first °• Your fluid goal is to drink 64-100 ounces of fluid daily °o It may take a few weeks to build up to this  °• 32 oz. (or more) should be clear liquids °And °• 32 oz. (or more) should be full liquids (see below for examples) °• Liquids should not contain sugar, caffeine, or carbonation ° °Clear Liquids: °• Water of Sugar-free flavored water (i.e. Fruit H²O, Propel) °• Decaffeinated coffee or tea (sugar-free) °• Crystal lite, Wyler’s Lite, Minute Maid Lite °• Sugar-free Jell-O °• Bouillon or broth °• Sugar-free Popsicle:    - Less than 20 calories  each; Limit 1 per day ° °Full Liquids: °                  Protein Shakes/Drinks + 2 choices per day of other full liquids °• Full liquids must be: °o No More Than 12 grams of Carbs per serving °o No More Than 3 grams of Fat per serving °• Strained low-fat cream soup °• Non-Fat milk °• Fat-free Lactaid Milk °• Sugar-free yogurt (Dannon Lite & Fit, Greek yogurt) ° °  °Vitamins and Minerals • Start 1 day after surgery unless otherwise directed by your surgeon °• 2 Chewable Bariatric Multivitamin / Multimineral Supplement with iron °• Chewable Calcium Citrate with Vitamin D-3 °(Example: 3 Chewable Calcium  Plus 600 with Vitamin D-3) °o Take 500 mg three (3) times a day for a total of 1500 mg each day °o Do not take all 3 doses of calcium   at one time as it may cause constipation, and you can only absorb 500 mg at a time °o Do not mix multivitamins containing iron with calcium supplements;  take 2 hours apart °• Menstruating women and those at risk for anemia ( a blood disease that causes weakness) may need extra iron °o Talk to your doctor to see if you need more iron °• If you need extra iron: Total daily Iron recommendation (including Vitamins) is 50 to 100 mg Iron/day °• Do not stop taking or change any vitamins or minerals until you talk to your nutritionist or surgeon °• Your nutritionist and/or surgeon must approve all vitamin and mineral supplements °  °Activity and Exercise: It is important to continue walking at home. Limit your physical activity as instructed by your doctor. During this time, use these guidelines: °• Do not lift anything greater than ten  (10) pounds for at least two (2) weeks °• Do not go back to work or drive until your surgeon says you can °• You may have sex when you feel comfortable °o It is VERY important for female patients to use a reliable birth control method; fertility often increase after surgery °o Do not get pregnant for at least 18 months °• Start exercising as soon as your  doctor tells you that you can °o Make sure your doctor approves any physical activity °• Start with a simple walking program °• Walk 5-15 minutes each day, 7 days per week °• Slowly increase until you are walking 30-45 minutes per day °• Consider joining our BELT program. [phone] or email [email or web address] °  °Special Instructions Things to remember: °• Use your CPAP when sleeping if this applies to you °• Consider buying a medical alert bracelet that says you had lap-band surgery °  °  You will likely have your first fill (fluid added to your band) 6 - 8 weeks after surgery °• [facility] has a free Bariatric Surgery Support Group that meets monthly, the 3rd Thursday, 6pm. [employer] Classrooms. You can see classes online at [email or web address] °• It is very important to keep all follow up appointments with your surgeon, nutritionist, primary care physician, and behavioral health practitioner °o After the first year, please follow up with your bariatric surgeon and nutritionist at least once a year in order to maintain best weight loss results °      °             [facility]:  [phone] ° °             [facility]: [phone] ° °             Bariatric Nurse Coordinator: [phone]  °Gastric Bypass/Sleeve Home Care Instructions  Rev. 08/2012    ° °                                                    Reviewed and Endorsed °                                                   by [place] Patient Education Committee, Jan, 2014 ° ° ° ° ° ° ° ° ° °

## 2017-06-04 NOTE — Transfer of Care (Signed)
Immediate Anesthesia Transfer of Care Note  Patient: Leslie Mason  Procedure(s) Performed: Procedure(s): LAPAROSCOPIC GASTRIC SLEEVE RESECTION WITH UPPER ENDO, HIATAL HERNIA REPAIR (N/A)  Patient Location: PACU  Anesthesia Type:General  Level of Consciousness:  sedated, patient cooperative and responds to stimulation  Airway & Oxygen Therapy:Patient Spontanous Breathing and Patient connected to face mask oxgen  Post-op Assessment:  Report given to PACU RN and Post -op Vital signs reviewed and stable  Post vital signs:  Reviewed and stable  Last Vitals:  Vitals:   06/04/17 0741  BP: (!) 180/107  Pulse: 65  Resp: 18  Temp: 36.9 C  SpO2: 99%    Complications: No apparent anesthesia complications

## 2017-06-04 NOTE — Progress Notes (Signed)
Text page sent to Dr. Abbey Chattersosenbower: "BP 160s/low 100s; Last one at 2159 165/100; Do you want to order something PRN for BP? Thanks" MD returned call; see orders.

## 2017-06-04 NOTE — Op Note (Signed)
Leslie MaserMelinda Mason [id number] 03/06/1971 06/04/2017  Preoperative diagnosis: sleeve gastrectomy with hiatal hernia  Postoperative diagnosis: Same   Procedure: Upper endoscopy   Surgeon: Susy FrizzleMatt B. Daphine DeutscherMartin  M.D., FACS   Anesthesia: Gen.   Indications for procedure: This patient was undergoing a sleeve gastrectomy with hiatal hernia repair (1 post).    Description of procedure: The endoscopy was placed in the mouth and into the oropharynx and under endoscopic vision it was advanced to the esophagogastric junction where the staple line was seen and evaluated. .  The pouch was insufflated and a cylindrical sleeve was present.  No bubbles noted.  The antrum and pylorus were identified and the overall appearance was good.  .   No bleeding or leaks were detected.  The scope was withdrawn without difficulty.     Matt B. Daphine DeutscherMartin, MD, FACS General, Bariatric, & Minimally Invasive Surgery [facility], [place]

## 2017-06-04 NOTE — Interval H&P Note (Signed)
History and Physical Interval Note:  06/04/2017 9:55 AM  Leslie Mason  has presented today for surgery, with the diagnosis of MORBID OBESITY  The various methods of treatment have been discussed with the patient and family. After consideration of risks, benefits and other options for treatment, the patient has consented to  Procedure(s): LAPAROSCOPIC GASTRIC SLEEVE RESECTION WITH UPPER ENDO (N/A) as a surgical intervention .  The patient's history has been reviewed, patient examined, no change in status, stable for surgery.  I have reviewed the patient's chart and labs.  Questions were answered to the patient's satisfaction.   Leslie SellaEric [name], MD, FACS General, Bariatric, & Minimally Invasive Surgery [facility], [place]   Gaynelle AduEric Stephanie Littman

## 2017-06-04 NOTE — Anesthesia Postprocedure Evaluation (Signed)
Anesthesia Post Note  Patient: Leslie Mason  Procedure(s) Performed: LAPAROSCOPIC GASTRIC SLEEVE RESECTION WITH UPPER ENDO, HIATAL HERNIA REPAIR (N/A Abdomen)     Patient location during evaluation: PACU Anesthesia Type: General Level of consciousness: awake and sedated Pain management: pain level controlled Vital Signs Assessment: post-procedure vital signs reviewed and stable Cardiovascular status: stable Postop Assessment: no apparent nausea or vomiting Anesthetic complications: no    Last Vitals:  Vitals:   06/04/17 1245 06/04/17 1330  BP: (!) 177/103   Pulse: 62   Resp: 20   Temp:  36.5 C  SpO2: 100%     Last Pain:  Vitals:   06/04/17 1330  TempSrc:   PainSc: Asleep   Pain Goal: Patients Stated Pain Goal: 4 (06/04/17 0753)               [name] JR,JOHN Susann GivensFRANKLIN

## 2017-06-04 NOTE — Anesthesia Procedure Notes (Signed)
Procedure Name: Intubation Date/Time: 06/04/2017 10:19 AM Performed by: Anne Fu, CRNA Pre-anesthesia Checklist: Patient identified, Emergency Drugs available, Suction available, Patient being monitored and Timeout performed Patient Re-evaluated:Patient Re-evaluated prior to induction Oxygen Delivery Method: Circle system utilized Preoxygenation: Pre-oxygenation with 100% oxygen Induction Type: IV induction Ventilation: Mask ventilation without difficulty Laryngoscope Size: Mac and 4 Grade View: Grade I Tube type: Oral Tube size: 7.5 mm Number of attempts: 1 Airway Equipment and Method: Stylet Placement Confirmation: ETT inserted through vocal cords under direct vision,  positive ETCO2 and breath sounds checked- equal and bilateral Secured at: 21 cm Tube secured with: Tape Dental Injury: Teeth and Oropharynx as per pre-operative assessment

## 2017-06-04 NOTE — Op Note (Signed)
06/04/2017 Leslie MaserMelinda Mason 03/06/1971 [id number]   PRE-OPERATIVE DIAGNOSIS:    Morbid obesity BMI 50   Obstructive sleep apnea syndrome, mild   Prediabetes   Essential hypertension   Chronic low back pain  POST-OPERATIVE DIAGNOSIS:  Same + hiatal hernia   PROCEDURE:  Procedure(s): LAPAROSCOPIC SLEEVE GASTRECTOMY WITH HIATAL HERNIA REPAIR UPPER GI ENDOSCOPY  SURGEON:  Surgeon(s): Atilano InaEric M Minah Axelrod, MD FACS FASMBS  ASSISTANTS: Luretha MurphyMatthew Martin, MD FACS  ANESTHESIA:   general  DRAINS: none   BOUGIE: 40 fr ViSiGi  LOCAL MEDICATIONS USED:   Exparel  EBL: 25 cc  SPECIMEN:  Source of Specimen:  Greater curvature of stomach  DISPOSITION OF SPECIMEN:  PATHOLOGY  COUNTS:  YES  INDICATION FOR PROCEDURE: This is a very pleasant 46 y.o.-year-old morbidly obese female who has had unsuccessful attempts for sustained weight loss. The patient presents today for a planned laparoscopic sleeve gastrectomy with upper endoscopy. We have discussed the risk and benefits of the procedure extensively preoperatively. Please see my separate notes.  PROCEDURE: After obtaining informed consent and receiving 5000 units of subcutaneous heparin, the [facility] and placed supine on the operating room table. General endotracheal anesthesia was established. Sequential compression devices were placed. A orogastric tube was placed. The patient's abdomen was prepped and draped in the usual standard surgical fashion. The patient received preoperative IV antibiotic. A surgical timeout was performed.  Access to the abdomen was achieved using a 5 mm 0 laparoscope thru a 5 mm trocar In the left upper Quadrant 2 fingerbreadths below the left subcostal margin using the Optiview technique. Pneumoperitoneum was smoothly established up to 15 mm of mercury. The laparoscope was advanced and the abdominal cavity was surveilled. The patient was then placed in reverse  Trendelenburg. A 5 mm trocar was placed slightly above and to the left of the umbilicus under direct visualization.  The [facility] liver retractor was placed under the left lobe of the liver through a 5 mm trocar incision site in the subxiphoid position. A 5 mm trocar was placed in the lateral right upper quadrant along with a 15 mm trocar in the mid right abdomen. A final 5 mm trocar was placed in the lateral LUQ.  All under direct visualization after exparel had been infiltrated in bilateral lateral upper abdominal walls as TAP block  The stomach was inspected. It was completely decompressed and the orogastric tube was removed.  There was no anterior dimple that was obviously visible. Her preop UGI showed no hiatal hernia.  The calibration tube was placed in the oropharynx and guided down into the stomach by the CRNA. 10 mL of air was insufflated into the calibration balloon. The calibration tubing was then gently pulled back by the CRNA and it slid past the GE junction. At this point the calibration tubing was desufflated and pulled back into the esophagus. This confirmed my suspicion of a clinically significant hiatal hernia. The gastrohepatic ligament was incised with harmonic scalpel. The right crus was identified. We identified the crossing fat along the right crus. The adipose tissue just above this area was incised with harmonic scalpel. I then bluntly dissected out this area and identified the left crus. There was evidence of a hiatal hernia. I then mobilized the esophagus. The left and right crus were further mobilized with blunt dissection. I was then able to reapproximate the left and right crus with 0 Ethibond using an Endostitch suture device. We then had the CRNA  readvanced the calibration tubing back into the stomach. 10 mL of air was insufflated into the calibration tube balloon. The calibration tube was then gently pulled back and there was resistance at the GE junction. The tube did not slide  back up into the esophagus. At this point the calibration tubing was deflated and removed from the patient's body.   We identified the pylorus and measured 6 cm proximal to the pylorus and identified an area of where we would start taking down the short gastric vessels. Harmonic scalpel was used to take down the short gastric vessels along the greater curvature of the stomach. We were able to enter the lesser sac. We continued to march along the greater curvature of the stomach taking down the short gastrics. As we approached the gastrosplenic ligament we took care in this area not to injure the spleen. We were able to take down the entire gastrosplenic ligament. We then mobilized the fundus away from the left crus of diaphragm. There were some posterior gastric avascular attachments which were taken down. This left the stomach completely mobilized. No vessels had been taken down along the lesser curvature of the stomach.  We then reidentified the pylorus. A 40Fr ViSiGi was then placed in the oropharynx and advanced down into the stomach and placed in the distal antrum and positioned along the lesser curvature. It was placed under suction which secured the 40Fr ViSiGi in place along the lesser curve. Then using the Ethicon echelon 60 mm stapler with a green load with Seamguard, I placed a stapler along the antrum approximately 5 cm from the pylorus. The stapler was angled so that there is ample room at the angularis incisura. I then fired the first staple load after inspecting it posteriorly to ensure adequate space both anteriorly and posteriorly. At this point I started using 60 mm gold load staple cartridge x1 with Seamguard. The echelon stapler was then repositioned with a 60 mm blue load with Seamguard and we continued to march up along the ViSiGi. My assistant was holding traction along the greater curvature stomach along the cauterized short gastric vessels ensuring that the stomach was symmetrically  retracted. Prior to each firing of the staple, we rotated the stomach to ensure that there is adequate stomach left.  As we approached the fundus, I used 60 mm blue cartridge with Seamguard aiming slightly lateral to the GE junction. The sleeve was inspected. There is no evidence of cork screw. The staple line appeared hemostatic. The CRNA inflated the ViSiGi to the green zone and the upper abdomen was flooded with saline. There were no bubbles. The sleeve was decompressed and the ViSiGi removed. My assistant scrubbed out and performed an upper endoscopy. The sleeve easily distended with air and the scope was easily advanced to the pylorus. There is no evidence of internal bleeding or cork screwing. There was no narrowing at the angularis. There is no evidence of bubbles. Please see his operative note for further details. The gastric sleeve was decompressed and the endoscope was removed.  The greater curvature the stomach was grasped with a laparoscopic grasper and removed from the 15 mm trocar site.  The liver retractor was removed. I then closed the 15 mm trocar site with 1 interrupted 0 Vicryl sutures through the fascia using the endoclose. The closure was viewed laparoscopically and it was airtight. Remaining Exparel was then infiltrated in the preperitoneal spaces around the trocar sites. Pneumoperitoneum was released. All trocar sites were closed with a 4-0  Monocryl in a subcuticular fashion followed by the application of benzoin, steri-strips, and bandages. The patient was extubated and taken to the recovery room in stable condition. All needle, instrument, and sponge counts were correct x2. There are no immediate complications  (1) 60 mm green with Seamguard (1) 60 mm gold with seamguard (3) 60 mm blue with seamguard  PLAN OF CARE: Admit to inpatient   PATIENT DISPOSITION:  PACU - hemodynamically stable.   Delay start of Pharmacological VTE agent (>24hrs) due to surgical blood loss or risk of  bleeding:  no  Mary Sella. Andrey Campanile, MD, FACS FASMBS General, Bariatric, & Minimally Invasive Surgery [facility], [place]

## 2017-06-05 ENCOUNTER — Encounter (HOSPITAL_COMMUNITY): Payer: Self-pay | Admitting: General Surgery

## 2017-06-05 LAB — COMPREHENSIVE METABOLIC PANEL
ALBUMIN: 3.8 g/dL (ref 3.5–5.0)
ALT: 22 U/L (ref 14–54)
ANION GAP: 9 (ref 5–15)
AST: 29 U/L (ref 15–41)
Alkaline Phosphatase: 92 U/L (ref 38–126)
BILIRUBIN TOTAL: 0.8 mg/dL (ref 0.3–1.2)
BUN: 10 mg/dL (ref 6–20)
CHLORIDE: 99 mmol/L — AB (ref 101–111)
CO2: 26 mmol/L (ref 22–32)
Calcium: 9.4 mg/dL (ref 8.9–10.3)
Creatinine, Ser: 0.87 mg/dL (ref 0.44–1.00)
GFR calc Af Amer: >60 mL/min (ref >60)
GFR calc non Af Amer: >60 mL/min (ref >60)
GLUCOSE: 126 mg/dL — AB (ref 65–99)
POTASSIUM: 4.2 mmol/L (ref 3.5–5.1)
SODIUM: 134 mmol/L — AB (ref 135–145)
Total Protein: 7.6 g/dL (ref 6.5–8.1)

## 2017-06-05 LAB — CBC WITH DIFFERENTIAL/PLATELET
BASOS ABS: 0 10*3/uL (ref 0.0–0.1)
BASOS PCT: 0 %
EOS ABS: 0 10*3/uL (ref 0.0–0.7)
Eosinophils Relative: 0 %
HEMATOCRIT: 38.4 % (ref 36.0–46.0)
Hemoglobin: 12.6 g/dL (ref 12.0–15.0)
Lymphocytes Relative: 13 %
Lymphs Abs: 1.2 10*3/uL (ref 0.7–4.0)
MCH: 26.9 pg (ref 26.0–34.0)
MCHC: 32.8 g/dL (ref 30.0–36.0)
MCV: 81.9 fL (ref 78.0–100.0)
MONO ABS: 0.8 10*3/uL (ref 0.1–1.0)
Monocytes Relative: 8 %
NEUTROS ABS: 7.2 10*3/uL (ref 1.7–7.7)
Neutrophils Relative %: 79 %
PLATELETS: 214 10*3/uL (ref 150–400)
RBC: 4.69 MIL/uL (ref 3.87–5.11)
RDW: 14.6 % (ref 11.5–15.5)
WBC: 9.2 10*3/uL (ref 4.0–10.5)

## 2017-06-05 NOTE — Progress Notes (Signed)
Patient alert and oriented, Post op day 1.  Provided support and encouragement.  Encouraged pulmonary toilet, ambulation and small sips of liquids.  Completed 12 ounces of clear fluid started protein shake.  C/o nausea. Medicated by bedside RN.   All questions answered.  Will continue to monitor.

## 2017-06-05 NOTE — Plan of Care (Signed)
Nutrition Education Note  Received consult for diet education per DROP protocol.   Discussed 2 week post op diet with pt. Emphasized that liquids must be non carbonated, non caffeinated, and sugar free. Fluid goals discussed. Pt to follow up with outpatient bariatric RD for further diet progression after 2 weeks. Multivitamins and minerals also reviewed. Teach back method used, pt expressed understanding, expect good compliance.   Diet: First 2 Weeks  You will see the nutritionist about two (2) weeks after your surgery. The nutritionist will increase the types of foods you can eat if you are handling liquids well:  If you have severe vomiting or nausea and cannot handle clear liquids lasting longer than 1 day, call your surgeon  Protein Shake  Drink at least 2 ounces of shake 5-6 times per day  Each serving of protein shakes (usually 8 - 12 ounces) should have a minimum of:  15 grams of protein  And no more than 5 grams of carbohydrate  Goal for protein each day:  Men = 80 grams per day  Women = 60 grams per day  Protein powder may be added to fluids such as non-fat milk or Lactaid milk or Soy milk (limit to 35 grams added protein powder per serving)   Hydration  Slowly increase the amount of water and other clear liquids as tolerated (See Acceptable Fluids)  Slowly increase the amount of protein shake as tolerated  Sip fluids slowly and throughout the day  May use sugar substitutes in small amounts (no more than 6 - 8 packets per day; i.e. Splenda)   Fluid Goal  The first goal is to drink at least 8 ounces of protein shake/drink per day (or as directed by the nutritionist); some examples of protein shakes are Premier Protein, Syntrax Nectar, Adkins Advantage, EAS Edge HP, and Unjury. See handout from pre-op Bariatric Education Class:  Slowly increase the amount of protein shake you drink as tolerated  You may find it easier to slowly sip shakes throughout the day  It is important to  get your proteins in first  Your fluid goal is to drink 64 - 100 ounces of fluid daily  It may take a few weeks to build up to this  32 oz (or more) should be clear liquids  And  32 oz (or more) should be full liquids (see below for examples)  Liquids should not contain sugar, caffeine, or carbonation   Clear Liquids:  Water or Sugar-free flavored water (i.e. Fruit H2O, Propel)  Decaffeinated coffee or tea (sugar-free)  Crystal Lite, Wyler?s Lite, Minute Maid Lite  Sugar-free Jell-O  Bouillon or broth  Sugar-free Popsicle: *Less than 20 calories each; Limit 1 per day   Full Liquids:  Protein Shakes/Drinks + 2 choices per day of other full liquids  Full liquids must be:  No More Than 12 grams of Carbs per serving  No More Than 3 grams of Fat per serving  Strained low-fat cream soup  Non-Fat milk  Fat-free Lactaid Milk  Sugar-free yogurt (Dannon Lite & Fit, Greek yogurt, Oikos Zero)   Talan Gildner, MS, RD, LDN [facility] Inpatient Clinical Dietitian Pager: [phone] After Hours Pager: [phone]   

## 2017-06-05 NOTE — Progress Notes (Signed)
Patient remains nauseous after Phenergan; Zofran given. Encouraged patient to walk in hall as soon as she is able, as this may help with nausea. Patient states she will. Lina SarBeth Norfleet Capers, RN

## 2017-06-05 NOTE — Progress Notes (Signed)
Patient ID: Leslie Mason, female   DOB: 06/11/1971, [age] y.o.   MRN: [id number]   Progress Note: Metabolic and Bariatric Surgery Service   Chief Complaint/Subjective: Did well with water overnight but complains of nausea this morning with protein shake.  Walks several times in the hallway.  Pain medicine is controlling discomfort.  Some burping but no regurgitation  Objective: Vital signs in last 24 hours: Temp:  [98.1 F (36.7 C)-98.9 F (37.2 C)] 98.1 F (36.7 C) (12/04 1415) Pulse Rate:  [71-78] 73 (12/04 1415) Resp:  [16] 16 (12/04 1415) BP: (117-167)/(69-103) 167/85 (12/04 1415) SpO2:  [100 %] 100 % (12/04 1415) Last BM Date: 06/03/17  Intake/Output from previous day: 12/03 0701 - 12/04 0700 In: 3130.4 [P.O.:420; I.V.:2710.4] Out: 3575 [Urine:3550; Blood:25] Intake/Output this shift: Total I/O In: 1015 [P.O.:15; I.V.:1000] Out: 300 [Urine:300]  Lungs: cta, reg  Cardiovascular: reg  Abd: soft, obese, min TTP, incisions ok  Extremities: no edema, +SCDs  Neuro: nonfocal, alert, nontoxic  Lab Results: CBC  Recent Labs    06/04/17 1255 06/05/17 0515  WBC  --  9.2  HGB 13.9 12.6  HCT 44.6 38.4  PLT  --  214   BMET Recent Labs    06/05/17 0515  NA 134*  K 4.2  CL 99*  CO2 26  GLUCOSE 126*  BUN 10  CREATININE 0.87  CALCIUM 9.4   PT/INR No results for input(s): LABPROT, INR in the last 72 hours. ABG No results for input(s): PHART, HCO3 in the last 72 hours.  Invalid input(s): PCO2, PO2  Studies/Results:  Anti-infectives: Anti-infectives (From admission, onward)   Start     Dose/Rate Route Frequency Ordered Stop   06/04/17 0738  cefoTEtan in Dextrose 5% (CEFOTAN) IVPB 2 g     2 g Intravenous On call to O.R. 06/04/17 0738 06/04/17 1025      Medications: Scheduled Meds: . acetaminophen (TYLENOL) oral liquid 160 mg/5 mL  650 mg Oral Q6H  . enoxaparin (LOVENOX) injection  30 mg Subcutaneous Q12H  . gabapentin  200 mg Oral Q12H  . nebivolol  5  mg Oral Daily  . pantoprazole (PROTONIX) IV  40 mg Intravenous QHS  . [START ON 06/06/2017] protein supplement shake  2 oz Oral Q2H   Continuous Infusions: . dextrose 5 % and 0.45 % NaCl with KCl 20 mEq/L 1,000 mL (06/05/17 0258)   PRN Meds:.albuterol, diphenhydrAMINE, hydrALAZINE, morphine injection, ondansetron (ZOFRAN) IV, oxyCODONE, promethazine, simethicone  Assessment/Plan: Patient Active Problem List   Diagnosis Date Noted  . Morbid obesity (HCC) 06/04/2017  . Obstructive sleep apnea syndrome, mild 06/04/2017  . Prediabetes 06/04/2017  . Essential hypertension 06/04/2017  . Chronic low back pain 06/04/2017  . S/P laparoscopic sleeve gastrectomy 06/04/2017   s/p Procedure(s): LAPAROSCOPIC GASTRIC SLEEVE RESECTION WITH UPPER ENDO, HIATAL HERNIA REPAIR 06/04/2017  No fever, no wbc, no tachy Looks ok but having some nausea Cont antiemetics Shakes/water as tolerated Cont chemical vte prophylaxis, ambulate If doesn't meet fluid goals, will keep Disposition:  LOS: 1 day  The patient does not meet criteria for discharge because She did not meet oral intake goals and is at high risk of dehydration  Gaynelle AduEric Arianah Torgeson, MD [phone] [facility]

## 2017-06-05 NOTE — Progress Notes (Signed)
Patient states nausea is a "little better" but she is unable to drink Protein shake. Enzo BiBeth Pasmore, RN

## 2017-06-06 LAB — CBC WITH DIFFERENTIAL/PLATELET
BASOS ABS: 0 10*3/uL (ref 0.0–0.1)
BASOS PCT: 0 %
EOS ABS: 0 10*3/uL (ref 0.0–0.7)
Eosinophils Relative: 0 %
HEMATOCRIT: 38.7 % (ref 36.0–46.0)
HEMOGLOBIN: 12.1 g/dL (ref 12.0–15.0)
Lymphocytes Relative: 25 %
Lymphs Abs: 2 10*3/uL (ref 0.7–4.0)
MCH: 25.6 pg — ABNORMAL LOW (ref 26.0–34.0)
MCHC: 31.3 g/dL (ref 30.0–36.0)
MCV: 81.8 fL (ref 78.0–100.0)
Monocytes Absolute: 0.7 10*3/uL (ref 0.1–1.0)
Monocytes Relative: 9 %
NEUTROS ABS: 5.5 10*3/uL (ref 1.7–7.7)
NEUTROS PCT: 66 %
Platelets: 213 10*3/uL (ref 150–400)
RBC: 4.73 MIL/uL (ref 3.87–5.11)
RDW: 15 % (ref 11.5–15.5)
WBC: 8.3 10*3/uL (ref 4.0–10.5)

## 2017-06-06 MED ORDER — OXYCODONE HCL 5 MG/5ML PO SOLN: 5.0000 mg | ORAL | 0 refills | Status: DC | PRN

## 2017-06-06 MED FILL — oxyCODONE HCL 5 MG/5ML SOLN: 5 | 2 days supply | Qty: 100 | Fill #0

## 2017-06-06 NOTE — Progress Notes (Signed)
Patient alert and oriented, Post op day 2.  Provided support and encouragement.  Encouraged pulmonary toilet, ambulation and small sips of liquids.  All questions answered.  Will continue to monitor. 

## 2017-06-06 NOTE — Progress Notes (Signed)
Patient ID: Leslie Mason, female   DOB: 09/29/1970, 46 y.o.   MRN: [id number]   Progress Note: Metabolic and Bariatric Surgery Service   Chief Complaint/Subjective: Had persistent nausea yesterday which prevented dc due to low oral intake; nausea better but still there - states nausea in lower abdomen; had isolated low grade temp last pm 100.7 ; ambulated multi times; IS up to 1700  Objective: Vital signs in last 24 hours: Temp:  [98.1 F (36.7 C)-100.7 F (38.2 C)] 98.9 F (37.2 C) (12/05 0513) Pulse Rate:  [69-80] 69 (12/05 0513) Resp:  [16-18] 18 (12/05 0513) BP: (138-167)/(72-91) 155/87 (12/05 0513) SpO2:  [97 %-100 %] 97 % (12/05 0513) Weight:  [151.6 kg (334 lb 4.2 oz)] 151.6 kg (334 lb 4.2 oz) (12/05 0513) Last BM Date: 06/03/17  Intake/Output from previous day: 12/04 0701 - 12/05 0700 In: 3135 [P.O.:135; I.V.:3000] Out: 1700 [Urine:1700] Intake/Output this shift: No intake/output data recorded.  Lungs: cta/ symmetric  Cardiovascular: reg  Abd: soft, min TTP, incisions ok  Extremities: no edema; +SCDs  Neuro: alert, nonfocal, non toxic  Lab Results: CBC  Recent Labs    06/05/17 0515 06/06/17 0528  WBC 9.2 8.3  HGB 12.6 12.1  HCT 38.4 38.7  PLT 214 213   BMET Recent Labs    06/05/17 0515  NA 134*  K 4.2  CL 99*  CO2 26  GLUCOSE 126*  BUN 10  CREATININE 0.87  CALCIUM 9.4   PT/INR No results for input(s): LABPROT, INR in the last 72 hours. ABG No results for input(s): PHART, HCO3 in the last 72 hours.  Invalid input(s): PCO2, PO2  Studies/Results:  Anti-infectives: Anti-infectives (From admission, onward)   Start     Dose/Rate Route Frequency Ordered Stop   06/04/17 0738  cefoTEtan in Dextrose 5% (CEFOTAN) IVPB 2 g     2 g Intravenous On call to O.R. 06/04/17 0738 06/04/17 1025      Medications: Scheduled Meds: . acetaminophen (TYLENOL) oral liquid 160 mg/5 mL  650 mg Oral Q6H  . enoxaparin (LOVENOX) injection  30 mg Subcutaneous  Q12H  . gabapentin  200 mg Oral Q12H  . nebivolol  5 mg Oral Daily  . pantoprazole (PROTONIX) IV  40 mg Intravenous QHS  . protein supplement shake  2 oz Oral Q2H   Continuous Infusions: . dextrose 5 % and 0.45 % NaCl with KCl 20 mEq/L 125 mL/hr at 06/06/17 0004   PRN Meds:.albuterol, diphenhydrAMINE, hydrALAZINE, morphine injection, ondansetron (ZOFRAN) IV, oxyCODONE, promethazine, simethicone  Assessment/Plan: Patient Active Problem List   Diagnosis Date Noted  . Morbid obesity (HCC) 06/04/2017  . Obstructive sleep apnea syndrome, mild 06/04/2017  . Prediabetes 06/04/2017  . Essential hypertension 06/04/2017  . Chronic low back pain 06/04/2017  . S/P laparoscopic sleeve gastrectomy 06/04/2017   s/p Procedure(s): LAPAROSCOPIC GASTRIC SLEEVE RESECTION WITH UPPER ENDO, HIATAL HERNIA REPAIR 06/04/2017  Low grade temp last pm but AF now, no tachy, no wbc Will cont with oral intake as tolerated Not sure if nausea is med related Monitor temp curve this am Ambulate, pulm toilet, cont chemical vte prophylaxis  Will see how she does throughout am/early pm Disposition:  LOS: 2 days    Gaynelle AduEric Xylah Early, MD [phone] [facility]

## 2017-06-06 NOTE — Progress Notes (Signed)
Discharge instructions discussed with patient and family, verbalized agreement and understanding 

## 2017-06-06 NOTE — Progress Notes (Signed)
Patient alert and oriented, pain is controlled. Patient is tolerating fluids, advanced to protein shake today, patient is tolerating well.  Reviewed Gastric sleeve discharge instructions with patient and patient is able to articulate understanding.  Provided information on BELT program, Support Group and [facility] outpatient pharmacy. All questions answered, will continue to monitor.  

## 2017-06-07 NOTE — Discharge Summary (Signed)
Physician Discharge Summary  Atlantis Delong KDT:[id number] DOB: Oct 27, 1970 DOA: 06/04/2017  PCP: Wenda Low, MD  Admit date: 06/04/2017 Discharge date: 06/06/2017  Recommendations for Outpatient Follow-up:   Follow-up Information    Greer Pickerel, MD. Go on 06/20/2017.   Specialty:  General Surgery Why:  at [address] information: [address] [place] [address] [phone]        Greer Pickerel, MD Follow up.   Specialty:  General Surgery Contact information: [place] [address] [phone]          Discharge Diagnoses:  Principal Problem:   Morbid obesity ([place]) Active Problems:   Obstructive sleep apnea syndrome, mild   Prediabetes   Essential hypertension   Chronic low back pain   S/P laparoscopic sleeve gastrectomy with hiatal hernia repair   Surgical Procedure: Laparoscopic Sleeve Gastrectomy with hiatal hernia repair, upper endoscopy  Discharge Condition: Good Disposition: Home  Diet recommendation: Postoperative sleeve gastrectomy diet (liquids only)  Filed Weights   06/04/17 0741 06/06/17 0513  Weight: (!) 151.3 kg (333 lb 9.6 oz) (!) 151.6 kg (334 lb 4.2 oz)     Hospital Course:  The patient was admitted for a planned laparoscopic sleeve gastrectomy. Please see operative note. Preoperatively the patient was given 5000 units of subcutaneous heparin for DVT prophylaxis. Postoperative prophylactic Lovenox dosing was started on the evening of postoperative day 0. ERAS protocol was used. On the evening of postoperative day 0, the patient was started on water and ice chips. On postoperative day 1 the patient had no fever or tachycardia and was tolerating water in their diet was gradually advanced throughout the day. However she had developed nausea and did not criteria for discharge based on oral intake. On POD 2, her nausea had improved and her oral intake improved. The patient was ambulating without difficulty.  Their vital signs are stable without fever or tachycardia. Their hemoglobin had remained stable. The patient was maintained on their home settings for CPAP therapy. The patient had received discharge instructions and counseling. They were deemed stable for discharge and had met discharge criteria   Discharge Instructions  Discharge Instructions    Ambulate hourly while awake   Complete by:  As directed    Call MD for:  difficulty breathing, headache or visual disturbances   Complete by:  As directed    Call MD for:  persistant dizziness or light-headedness   Complete by:  As directed    Call MD for:  persistant nausea and vomiting   Complete by:  As directed    Call MD for:  redness, tenderness, or signs of infection (pain, swelling, redness, odor or green/yellow discharge around incision site)   Complete by:  As directed    Call MD for:  severe uncontrolled pain   Complete by:  As directed    Call MD for:  temperature >101 F   Complete by:  As directed    Diet bariatric full liquid   Complete by:  As directed    Discharge instructions   Complete by:  As directed    See bariatric discharge instructions   Incentive spirometry   Complete by:  As directed    Perform hourly while awake     Allergies as of 06/06/2017      Reactions   Contrave [naltrexone-bupropion Hcl Er] Other (See Comments)   constipation      Medication List    TAKE these medications   albuterol  108 (90 Base) MCG/ACT inhaler Commonly known as:  PROVENTIL HFA;VENTOLIN HFA Inhale 2 puffs into the lungs every 6 (six) hours as needed for wheezing or shortness of breath.   Biotin 5000 MCG Tabs Take 15,000 mcg by mouth daily.   fluticasone 50 MCG/ACT nasal spray Commonly known as:  FLONASE Place 1 spray into both nostrils daily as needed for allergies or rhinitis.   medroxyPROGESTERone 10 MG tablet Commonly known as:  PROVERA Take 10 mg by mouth See admin instructions. Take for 10 days after missing cycle  for 3 months   multivitamin with minerals Tabs tablet Take 1 tablet by mouth daily.   nebivolol 5 MG tablet Commonly known as:  BYSTOLIC Take 5 mg by mouth daily. Notes to patient:  Monitor Blood Pressure Daily and keep a log for primary care physician.  You may need to make changes to your medications with rapid weight loss.     oxyCODONE 5 MG/5ML solution Commonly known as:  ROXICODONE Take 5-10 mLs (5-10 mg total) by mouth every 4 (four) hours as needed for moderate pain or severe pain.   Vitamin D3 5000 units Caps Take 10,000 Units by mouth daily.      Follow-up Information    Greer Pickerel, MD. Go on 06/20/2017.   Specialty:  General Surgery Why:  at [address] information: [address] [place] [address] [phone]        Greer Pickerel, MD Follow up.   Specialty:  General Surgery Contact information: [place] [address] [phone]            The results of significant diagnostics from this hospitalization (including imaging, microbiology, ancillary and laboratory) are listed below for reference.    Significant Diagnostic Studies: No results found.  Labs: Basic Metabolic Panel: Recent Labs  Lab 06/01/17 0954 06/05/17 0515  NA 138 134*  K 4.6 4.2  CL 106 99*  CO2 25 26  GLUCOSE 89 126*  BUN 20 10  CREATININE 0.93 0.87  CALCIUM 9.5 9.4   Liver Function Tests: Recent Labs  Lab 06/01/17 0954 06/05/17 0515  AST 22 29  ALT 18 22  ALKPHOS 93 92  BILITOT 0.8 0.8  PROT 7.7 7.6  ALBUMIN 4.1 3.8    CBC: Recent Labs  Lab 06/01/17 0954 06/04/17 1255 06/05/17 0515 06/06/17 0528  WBC 5.8  --  9.2 8.3  NEUTROABS 3.3  --  7.2 5.5  HGB 12.5 13.9 12.6 12.1  HCT 39.8 44.6 38.4 38.7  MCV 82.7  --  81.9 81.8  PLT 218  --  214 213    CBG: No results for input(s): GLUCAP in the last 168 hours.  Principal Problem:   Morbid obesity ([place]) Active Problems:   Obstructive sleep apnea syndrome, mild    Prediabetes   Essential hypertension   Chronic low back pain   S/P laparoscopic sleeve gastrectomy   Time coordinating discharge: 15 min  Signed:  Gayland Curry, MD [facility], [place] [phone] 06/07/2017, 3:19 PM

## 2017-06-08 ENCOUNTER — Telehealth (HOSPITAL_COMMUNITY): Payer: Self-pay

## 2017-06-08 NOTE — Telephone Encounter (Signed)
Follow up with bariatric surgical patient to discuss post discharge questions.    1.  Are you having any pain not relieved by pain medication? Taking liquid Tylenol  2.  How much fluid total fluid intake have you had in the last 24/48 hours?  64 ounces of fluid  3.  How much protein intake have you had in the last 24/48 hours?60 grams of protein  4.  Have you had any trouble making urine?no making a lot of urin  5.  Have you had nausea that has not been relieved by nausea medication?no nausea medication is working  6.  Are you ambulating every hour?yes  7.  Are you passing gas or had a BM?passing gas no bm encouraged miralax  8.  Do you know how to contact [facility]? [facility]? [facility]?yes  9.  Are you taking your vitamins and calcium without difficulty?yes  10. Tell me how your incision looks?  Any redness, open incision, or drainage?looks good

## 2017-06-09 DIAGNOSIS — G4733 Obstructive sleep apnea (adult) (pediatric): Secondary | ICD-10-CM | POA: Diagnosis not present

## 2017-06-15 DIAGNOSIS — Z9884 Bariatric surgery status: Secondary | ICD-10-CM | POA: Diagnosis not present

## 2017-06-15 DIAGNOSIS — I10 Essential (primary) hypertension: Secondary | ICD-10-CM | POA: Diagnosis not present

## 2017-06-19 ENCOUNTER — Encounter: Payer: 59 | Attending: Internal Medicine | Admitting: Skilled Nursing Facility1

## 2017-06-19 DIAGNOSIS — M545 Low back pain: Secondary | ICD-10-CM | POA: Diagnosis not present

## 2017-06-19 DIAGNOSIS — I10 Essential (primary) hypertension: Secondary | ICD-10-CM | POA: Insufficient documentation

## 2017-06-19 DIAGNOSIS — Z713 Dietary counseling and surveillance: Secondary | ICD-10-CM | POA: Diagnosis not present

## 2017-06-20 ENCOUNTER — Encounter: Payer: Self-pay | Admitting: Skilled Nursing Facility1

## 2017-06-20 NOTE — Progress Notes (Signed)
Bariatric Class:  Appt start time: 1530 end time:  1630.  2 Week Post-Operative Nutrition Class  Patient was seen on 06/19/2017 for Post-[facility]ative Nutrition [facility].   Surgery date: 06/04/2017 Surgery type: Sleeve Start weight at [facility]: 336.7 Weight today: 315.8  TANITA  BODY COMP RESULTS  06/19/2017   BMI (kg/m^2) 48   Fat Mass (lbs) 176.2   Fat Free Mass (lbs) 139.6   Total Body Water (lbs) 104.8   The following the learning objectives were met by the patient during this course:  Identifies Phase 3A (Soft, High Proteins) Dietary Goals and will begin from 2 weeks post-operatively to 2 months post-operatively  Identifies appropriate sources of fluids and proteins   States protein recommendations and appropriate sources post-operatively  Identifies the need for appropriate texture modifications, mastication, and bite sizes when consuming solids  Identifies appropriate multivitamin and calcium sources post-operatively  Describes the need for physical activity post-operatively and will follow MD recommendations  States when to call healthcare provider regarding medication questions or post-operative complications  Handouts given during class include:  Phase 3A: Soft, High Protein Diet Handout  Follow-Up Plan: Patient will follow-up at [facility] in 6 weeks for 2 month post-op nutrition visit for diet advancement per MD.

## 2017-07-04 ENCOUNTER — Encounter (INDEPENDENT_AMBULATORY_CARE_PROVIDER_SITE_OTHER): Payer: Self-pay

## 2017-07-04 ENCOUNTER — Ambulatory Visit (INDEPENDENT_AMBULATORY_CARE_PROVIDER_SITE_OTHER): Payer: 59 | Admitting: Neurology

## 2017-07-04 ENCOUNTER — Encounter: Payer: Self-pay | Admitting: Neurology

## 2017-07-04 VITALS — BP 190/109 | HR 58 | Ht 68.0 in | Wt 312.0 lb

## 2017-07-04 DIAGNOSIS — Z9989 Dependence on other enabling machines and devices: Secondary | ICD-10-CM | POA: Diagnosis not present

## 2017-07-04 DIAGNOSIS — G4733 Obstructive sleep apnea (adult) (pediatric): Secondary | ICD-10-CM | POA: Diagnosis not present

## 2017-07-04 NOTE — Progress Notes (Signed)
Subjective:    Patient ID: Leslie Mason is a 47 y.o. female.  HPI     Interim history:   Ms. Suares is a 47 year old right-handed woman with an underlying medical history of asthma, hypertension, elevated blood sugar, chronic low back pain, and morbid obesity with a BMI of over 50, who presents for follow-up consultation of her obstructive sleep apnea after recent sleep testing and starting AutoPap therapy. The patient is unaccompanied today. I first met her on 02/27/2017 at the request of her bariatric surgeon, at which time she reported snoring and sleep disruption. I suggested we proceed with sleep study testing in light of planned bariatric surgery in the near future. Her insurance denied an attended sleep study. She had a home sleep test on 04/11/2017 which showed an AHI of 7.3 per hour, average oxygen saturation of 94%, nadir of 86%. Based on her test results, her medical history and planned bariatric surgery I suggested she start AutoPap therapy at home.  Today, 07/04/2017: I reviewed her AutoPap compliance data from 05/29/2017 through 06/27/2017 which is a total of 30 days, during which time she used her AutoPap 25 days with percent used days greater than 4 hours at 80%, indicating very good compliance with an average usage of 7 hours and 9 minutes, residual AHI at goal at 0.8 per hour, 95th percentile pressure of 9.9 cm, leak high with the 95th percentile at 27.7 L/m, pressure range of 5-12 cm. She reports that the mask or headgear is causing her marks on her face. Of note, she had interim laparoscopic gastric sleeve bariatric surgery on 06/04/2017. She has lost about 30 pounds since her bariatric surgery. She feels improved in terms of sleep quality and daytime tiredness, no longer has gasping sensation at night.   The patient's allergies, current medications, family history, past medical history, past social history, past surgical history and problem list were reviewed and updated as  appropriate.   Previously (copied from previous notes for reference):   02/27/2017: (She) reports snoring and sleep disruption. I reviewed your office note from 01/24/2017, which you kindly included. She is in the process of being evaluated for bariatric surgery and is hoping to get surgery by the fall. Her Epworth sleepiness score is 0 out of 24, fatigue score is 17 out of 63. She lives alone, no kids. She works for [employer]. She is a nonsmoker, drinks alcohol infrequently and caffeine infrequently as well. She tries to drink only water or mostly water. She has significant nocturia about 2-3 times on average, sometimes as many as 5 times per night. She denies morning headaches. She has a family history of sleep apnea in her brother who has a CPAP machine. She tries to be in bed around 11 and typically watches TV for some time and turns it off. Wake up time is between 8 and 8:30 AM. She also endorses intermittent restless leg symptoms including a crawling sensation in her legs and it helps to move. In the mornings, her bed is typically disheveled. She was diagnosed with asthma when she was in high school. She has seasonal allergies as well. She wakes up in the mornings with drainage and mucus as well as coughing and sometimes feels like she is choking on her drainage. She also has a history of bruxism and TMJ issues, mostly on the left.  Her Past Medical History Is Significant For: Past Medical History:  Diagnosis Date  . Asthma   . Blood glucose elevated   . Chronic  bronchitis ([place])   . Chronic low back pain without sciatica   . Family history of adverse reaction to anesthesia    mother has hx of seizures, anesthesia made seizures more freuqeunt, had to be monitored inhospital longer ; procedure wss to remove a" polyp from her stomach or something"   . Hay fever   . Hypertension   . Pre-diabetes   . Sleep apnea    cpap use regular     Her Past Surgical History Is Significant For: Past Surgical  History:  Procedure Laterality Date  . ANKLE FRACTURE SURGERY Right    x3 , also achilles heel problems   . GINGIVECTOMY     early 2000s  . LAPAROSCOPIC GASTRIC SLEEVE RESECTION N/A 06/04/2017   Procedure: LAPAROSCOPIC GASTRIC SLEEVE RESECTION WITH UPPER ENDO, HIATAL HERNIA REPAIR;  Surgeon: Greer Pickerel, MD;  Location: [facility];  Service: General;  Laterality: N/A;  . SHOULDER SURGERY Right 2002   rotator cuff repair     Her Family History Is Significant For: Family History  Problem Relation Age of Onset  . Breast cancer Maternal Aunt   . Asthma Other   . Cancer Other   . Hypertension Other   . Prostate cancer Father     Her Social History Is Significant For: Social History   Socioeconomic History  . Marital status: Single    Spouse name: None  . Number of children: None  . Years of education: None  . Highest education level: None  Social Needs  . Financial resource strain: None  . Food insecurity - worry: None  . Food insecurity - inability: None  . Transportation needs - medical: None  . Transportation needs - non-medical: None  Occupational History  . None  Tobacco Use  . Smoking status: Never Smoker  . Smokeless tobacco: Never Used  Substance and Sexual Activity  . Alcohol use: Yes    Comment: socailly   . Drug use: No  . Sexual activity: None  Other Topics Concern  . None  Social History Narrative  . None    Her Allergies Are:  Allergies  Allergen Reactions  . Contrave [Naltrexone-Bupropion Hcl Er] Other (See Comments)    constipation  :   Her Current Medications Are:  Outpatient Encounter Medications as of 07/04/2017  Medication Sig  . albuterol (PROVENTIL HFA;VENTOLIN HFA) 108 (90 Base) MCG/ACT inhaler Inhale 2 puffs into the lungs every 6 (six) hours as needed for wheezing or shortness of breath.  Marland Kitchen CALCIUM PO Take 1,500 mg by mouth 2 (two) times daily.  . fluticasone (FLONASE) 50 MCG/ACT nasal spray Place 1 spray into both nostrils daily as needed  for allergies or rhinitis.  . medroxyPROGESTERone (PROVERA) 10 MG tablet Take 10 mg by mouth See admin instructions. Take for 10 days after missing cycle for 3 months  . Multiple Vitamin (MULTIVITAMIN WITH MINERALS) TABS tablet Take 1 tablet by mouth daily.  . nebivolol (BYSTOLIC) 5 MG tablet Take 5 mg by mouth daily.  . [DISCONTINUED] Biotin 5000 MCG TABS Take 15,000 mcg by mouth daily.  . [DISCONTINUED] Cholecalciferol (VITAMIN D3) 5000 units CAPS Take 10,000 Units by mouth daily.   . [DISCONTINUED] oxyCODONE (ROXICODONE) 5 MG/5ML solution Take 5-10 mLs (5-10 mg total) by mouth every 4 (four) hours as needed for moderate pain or severe pain.   No facility-administered encounter medications on file as of 07/04/2017.   :  Review of Systems:  Out of a complete 14 point review of systems, all  are reviewed and negative with the exception of these symptoms as listed below:  Review of Systems  Neurological:       Pt presents today to discuss her cpap. Pt is 1 month post bariatric surgery. Pt is complaining of marks on her face from the mask and will discuss a mask change with [employer].    Objective:  Neurological Exam  Physical Exam Physical Examination:   Vitals:   07/04/17 0938  BP: (!) 190/109  Pulse: (!) 58   General Examination: The patient is a very pleasant 47 y.o. female in no acute distress. She appears well-developed and well-nourished and well groomed.   HEENT: Normocephalic, atraumatic, pupils are equal, round and reactive to light and accommodation. Extraocular tracking is good without limitation to gaze excursion or nystagmus noted. Normal smooth pursuit is noted. Hearing is grossly intact. Face is symmetric with normal facial animation and normal facial sensation. Speech is clear with no dysarthria noted. There is no hypophonia. There is no lip, neck/head, jaw or voice tremor. Neck is supple with full range of passive and active motion. Oropharynx exam reveals: mild mouth  dryness, adequate dental hygiene and moderate airway crowding. Mallampati is class III. Tongue protrudes centrally and palate elevates symmetrically. Hyperpigmented area by L nasal area, slight on the R. No acute area of soreness or chafing.   Chest: Clear to auscultation without wheezing, rhonchi or crackles noted.  Heart: S1+S2+0, regular and normal without murmurs, rubs or gallops noted.   Abdomen: Soft, non-tender and non-distended with normal bowel sounds appreciated on auscultation.  Extremities: There is 1+ edema in the L ankle.   Skin: Warm and dry without trophic changes noted.  Musculoskeletal: exam reveals no obvious joint deformities, tenderness or joint swelling or erythema.   Neurologically:  Mental status: The patient is awake, alert and oriented in all 4 spheres. Her immediate and remote memory, attention, language skills and fund of knowledge are appropriate. There is no evidence of aphasia, agnosia, apraxia or anomia. Speech is clear with normal prosody and enunciation. Thought process is linear. Mood is normal and affect is normal.  Cranial nerves II - XII are as described above under HEENT exam. In addition: shoulder shrug is normal with equal shoulder height noted. Motor exam: Normal bulk, strength and tone is noted. There is no drift, tremor or rebound. Romberg is negative. Reflexes are 1+ throughout. Fine motor skills and coordination: grossly intact.  Cerebellar testing: No dysmetria or intention tremor on finger to nose testing. Heel to shin is unremarkable bilaterally. There is no truncal or gait ataxia.  Sensory exam: intact to light touch in the upper and lower extremities.  Gait, station and balance: She stands easily. No veering to one side is noted. No leaning to one side is noted. Posture is age-appropriate and stance is narrow based. Gait shows normal stride length and normal pace. No problems with tandem walk or turning.  Assessment and Plan:   In  summary, Lainey Nelson is a very pleasant 47 y.o. old female with an underlying medical history of asthma, hypertension, elevated blood sugar, chronic low back pain, and morbid obesity , who presents for follow-up consultation of her overall mild obstructive sleep apnea after home sleep testing and starting AutoPap therapy. She feels improved with regards to her episodes of choking spells at night, she feels that sleep is better quality and has noted less daytime tiredness and starting AutoPap therapy. She had recent bariatric surgery in December. She has done well, she  has already lost about 30 pounds. She has no new complaints, with the exception of air leaking from the mask and a pressure point on the left side more than right side from the nasal mask. She has a tendency to sleep on her left side. She may be a candidate for a nasal pillows interface. She may benefit from using a chinstrap as she has woken up with mouth open and mouth dryness. She is advised to make a mask refit appointment with her DME company. She is advised to follow-up routinely with one of our nurse practitioners here in about 6 months, sooner as needed. We can at some point hopefully also face her out of sleep apnea treatment as she continues to achieve her weight loss goals. I answered all her questions today and she was in agreement. I spent 25 minutes in total face-to-face time with the patient, more than 50% of which was spent in counseling and coordination of care, reviewing test results, reviewing medication and discussing or reviewing the diagnosis of OSA, its prognosis and treatment options. Pertinent laboratory and imaging test results that were available during this visit with the patient were reviewed by me and considered in my medical decision making (see chart for details).

## 2017-07-04 NOTE — Patient Instructions (Addendum)
Please continue using your autoPAP regularly. While your insurance requires that you use PAP at least 4 hours each night on 70% of the nights, I recommend, that you not skip any nights and use it throughout the night if you can. Getting used to PAP and staying with the treatment long term does take time and patience and discipline. Untreated obstructive sleep apnea when it is moderate to severe can have an adverse impact on cardiovascular health and raise her risk for heart disease, arrhythmias, hypertension, congestive heart failure, stroke and diabetes. Untreated obstructive sleep apnea causes sleep disruption, nonrestorative sleep, and sleep deprivation. This can have an impact on your day to day functioning and cause daytime sleepiness and impairment of cognitive function, memory loss, mood disturbance, and problems focussing. Using PAP regularly can improve these symptoms.  You have done very well with your autoPAP thus far!  We can see you in 6 nonths, you can see one of our nurse practitioners as you are stable. I will see you after that.

## 2017-07-10 DIAGNOSIS — G4733 Obstructive sleep apnea (adult) (pediatric): Secondary | ICD-10-CM | POA: Diagnosis not present

## 2017-07-19 ENCOUNTER — Telehealth (HOSPITAL_COMMUNITY): Payer: Self-pay

## 2017-07-19 NOTE — Telephone Encounter (Signed)
Patient called with concerns with vague nausea.  Encouraged patient to try previous phase of diet for couple days then advance diet as tolerated to current phase.  She is aware to continue to track protein (60 grams) and water (64-100 ounces).  She had tried to eat chili which made problem more noticeable.  Also, some questions regarding support groups.  Provided information to patient.  Patient understands that if nausea doesn't subside or she is unable to meet dietary requirements to call Dr. Andrey CampanileWilson office for guidance.

## 2017-08-01 ENCOUNTER — Encounter: Payer: 59 | Attending: Internal Medicine | Admitting: Registered"

## 2017-08-01 ENCOUNTER — Encounter: Payer: Self-pay | Admitting: Registered"

## 2017-08-01 DIAGNOSIS — I10 Essential (primary) hypertension: Secondary | ICD-10-CM | POA: Insufficient documentation

## 2017-08-01 DIAGNOSIS — Z713 Dietary counseling and surveillance: Secondary | ICD-10-CM | POA: Insufficient documentation

## 2017-08-01 DIAGNOSIS — M545 Low back pain: Secondary | ICD-10-CM | POA: Insufficient documentation

## 2017-08-01 DIAGNOSIS — E669 Obesity, unspecified: Secondary | ICD-10-CM

## 2017-08-01 NOTE — Progress Notes (Signed)
Follow-up visit: 8 Weeks Post-Operative Sleeve gastrectomy Surgery  Medical Nutrition Therapy:  Appt start time: 9:50 end time: 10:25.  Primary concerns today: Post-operative Bariatric Surgery Nutrition Management.  Non scale victories: learning to listen to body, can tell changes in body, more active  Surgery date: 06/04/2017 Surgery type: Sleeve [facility]: 336.7 Weight today: 298.0 Weight change: 17.8 lbs loss from 315.8 lbs (06/19/2017) Total weight lost: 38.7 lbs Weight loss goal: be more active, able to keep up with younger family members  TANITA  BODY COMP RESULTS  06/19/2017 08/01/2017   BMI (kg/m^2) 48 Pt declined   Fat Mass (lbs) 176.2    Fat Free Mass (lbs) 139.6    Total Body Water (lbs) 104.8     Pt states steak and pork are too heavy for her; it feels like its a brick on her chest. Pt states she can tolerate fish, chicken, and any ground meat. Pt states bowel movements are every other day to every 3 days. Pt states she plans to start BELT program next Friday and will attend 3 days a week. Pt states she is in the process of buying a new home and moving. Pt states she was only taking 1 Celebrate multi along with TUMS.    Preferred Learning Style:   No preference indicated   Learning Readiness:   Ready  Change in progress  24-hr recall: B (AM): 3 slices deli ham (14g) Snk (AM): 3 teaspoons of pimento cheese (7g) L (PM): sausage link (18g) Snk (PM): premier clear protein (20g)  D (PM): sauteed mushrooms  Snk (PM): none  Fluid intake: water, Nature's Twist, diet green tea, protein drinks; ~64 oz Estimated total protein intake: ~60 grams  Medications: See list Supplementation:  Celebrate + 3 TUMS  Using straws: no Drinking while eating: no Having you been chewing well: yes Chewing/swallowing difficulties: no Changes in vision: no Changes to mood/headaches: no Hair loss/Changes to skin/Changes to nails: no, no, no Any difficulty focusing or  concentrating: no Sweating: no Dizziness/Lightheaded:  Palpitations: no  Carbonated beverages: no N/V/D/C/GAS: yes with ribs, no, yes taking Miralax, sometimes when drinking too fast Abdominal Pain: once on lower right side Dumping syndrome: no Last Lap-Band fill: N/A  Recent physical activity:  Walking, stairs will start BELT next week  Progress Towards Goal(s):  In progress.  Handouts given during visit include:  Phase 3B: High Protein + NS vegtetables   Nutritional Diagnosis:  [place]-3.3 Overweight/obesity related to past poor dietary habits and physical inactivity as evidenced by patient w/ recent sleeve gastrectomy surgery following dietary guidelines for continued weight loss.     Intervention:  Nutrition education and counseling. Goals:  Follow Phase 3B: High Protein + Non-Starchy Vegetables  Eat 3-6 small meals/snacks, every 3-5 hrs  Increase lean protein foods to meet 60g goal  Increase fluid intake to 64oz +  Avoid drinking 15 minutes before, during and 30 minutes after eating  Aim for >30 min of physical activity daily - Try [employer] multivtiamin one a day.   Teaching Method Utilized:  Visual Auditory Hands on  Barriers to learning/adherence to lifestyle change: none  Demonstrated degree of understanding via:  Teach Back   Monitoring/Evaluation:  Dietary intake, exercise, lap band fills, and body weight. Follow up in 4 months for 6 month post-op visit.

## 2017-08-01 NOTE — Patient Instructions (Addendum)
Goals:  Follow Phase 3B: High Protein + Non-Starchy Vegetables  Eat 3-6 small meals/snacks, every 3-5 hrs  Increase lean protein foods to meet 60g goal  Increase fluid intake to 64oz +  Avoid drinking 15 minutes before, during and 30 minutes after eating  Aim for >30 min of physical activity daily  - Try [employer] multivtiamin one a day.

## 2017-08-10 DIAGNOSIS — G4733 Obstructive sleep apnea (adult) (pediatric): Secondary | ICD-10-CM | POA: Diagnosis not present

## 2017-09-03 ENCOUNTER — Telehealth: Payer: Self-pay | Admitting: Registered"

## 2017-09-03 NOTE — Telephone Encounter (Signed)
Hi Leslie Mason,  I hope you're doing well! You can have nuts and peanut butter at this point if you'd like. We prefer trying peanut butter powder options rather than actual peanut butter to reduce fat intake that comes with peanut butter. You should be able to handle them at this point. Introduce slowly and see how it goes. As with adding anything new, listen to your body. I hope this helps! Let me know if you need anything. Have a great day!  Thank you, Leslie Capuchinonetta [name], MS, RD, LDN [facility]  Nutrition & Diabetes Education Services Registered Dietitian I Direct Dial: [phone]  Fax: [phone] Website: [email or web address]    From: [email or web address]  Sent: Friday, August 31, 2017 2:31 PM To: Adela LankFloyd, Arael [email or web address]> Subject: [External Email]Question  *Caution - External email - see footer for warnings* Hello [name],  I am emailing to ask about what phase can I go to peanut butter.  I am trying to get in some proteins during snack and cheese all of the time is starting to constipate me so I was wondering about nuts and peanut butter.  Please let me know.  Thank you in advance.  Sincerely,  Leslie MaserMelinda Mason Feeder Scheduler [facility] [phone]  WARNING: This email originated outside of [facility]. Even if this looks like a [facility] email, it is not. Do not provide your username, password, or any other personal information in response to this or any other email. [facility] will never ask you for your username or password via email. DO NOT CLICK links or attachments unless you are positive the content is safe. If in doubt about the safety of this message, select the [employer] Phishing button, which forwards to IT Security.

## 2017-09-07 DIAGNOSIS — G4733 Obstructive sleep apnea (adult) (pediatric): Secondary | ICD-10-CM | POA: Diagnosis not present

## 2017-10-08 DIAGNOSIS — M7662 Achilles tendinitis, left leg: Secondary | ICD-10-CM | POA: Diagnosis not present

## 2017-10-08 DIAGNOSIS — M7661 Achilles tendinitis, right leg: Secondary | ICD-10-CM | POA: Diagnosis not present

## 2017-10-08 DIAGNOSIS — G4733 Obstructive sleep apnea (adult) (pediatric): Secondary | ICD-10-CM | POA: Diagnosis not present

## 2017-10-16 DIAGNOSIS — M7662 Achilles tendinitis, left leg: Secondary | ICD-10-CM | POA: Diagnosis not present

## 2017-10-16 DIAGNOSIS — M7661 Achilles tendinitis, right leg: Secondary | ICD-10-CM | POA: Diagnosis not present

## 2017-11-07 DIAGNOSIS — G4733 Obstructive sleep apnea (adult) (pediatric): Secondary | ICD-10-CM | POA: Diagnosis not present

## 2017-11-15 DIAGNOSIS — Z136 Encounter for screening for cardiovascular disorders: Secondary | ICD-10-CM | POA: Diagnosis not present

## 2017-11-15 DIAGNOSIS — Z0000 Encounter for general adult medical examination without abnormal findings: Secondary | ICD-10-CM | POA: Diagnosis not present

## 2017-11-15 DIAGNOSIS — R7303 Prediabetes: Secondary | ICD-10-CM | POA: Diagnosis not present

## 2017-12-04 ENCOUNTER — Encounter: Payer: 59 | Attending: General Surgery | Admitting: Registered"

## 2017-12-04 ENCOUNTER — Encounter: Payer: Self-pay | Admitting: Registered"

## 2017-12-04 DIAGNOSIS — Z713 Dietary counseling and surveillance: Secondary | ICD-10-CM | POA: Insufficient documentation

## 2017-12-04 DIAGNOSIS — Z6838 Body mass index (BMI) 38.0-38.9, adult: Secondary | ICD-10-CM | POA: Diagnosis not present

## 2017-12-04 DIAGNOSIS — E669 Obesity, unspecified: Secondary | ICD-10-CM

## 2017-12-04 DIAGNOSIS — Z9884 Bariatric surgery status: Secondary | ICD-10-CM | POA: Insufficient documentation

## 2017-12-04 NOTE — Progress Notes (Signed)
Follow-up visit: 6 Months Post-Operative Sleeve gastrectomy Surgery  Medical Nutrition Therapy:  Appt start time: 10:05 end time: 10:36.  Primary concerns today: Post-operative Bariatric Surgery Nutrition Management.  Non scale victories: learning to listen to body, can tell changes in body, more active, feels great, having to buy new clothes, from size 26-28/4XL to size 18-20, able to exercise more  Surgery date: 06/04/2017 Surgery type: Sleeve Start weight at [facility]: 336.7 Weight today: 254.0 Weight change: 44 lbs loss from 298.0 lbs (08/01/2017) Total weight lost: 82.7 lbs Weight loss goal: be more active, able to keep up with younger family members  TANITA  BODY COMP RESULTS  06/19/2017 08/01/2017 12/04/2017   BMI (kg/m^2) 48 Pt declined 38.6   Fat Mass (lbs) 176.2  125.0   Fat Free Mass (lbs) 139.6  129.0   Total Body Water (lbs) 104.8  94.8    Pt states things are getting monotonous and wants to add variety to food. Pt states she feels like the option to eat more is taken away due to surgery. Pt is doing well!  Pt states she can tolerate fish, chicken, and any ground meat. Pt states bowel movements are every other day to every 3 days. Pt states she is in the process of buying a new home and moving.   Preferred Learning Style:   No preference indicated   Learning Readiness:   Ready  Change in progress  24-hr recall: B (AM): eggs, bacon, sausage (21g) or 1/2 protein drink (10g) + P3 packet (15g) Snk (AM): peanut butter cup (7g) L (PM): seafood salad (21g)  Snk (PM): nuts or cheese (7g)  D (PM): baked crab cake (21g)  Snk (PM): none  Fluid intake: water (68 oz), Nature's Twist, diet green tea, protein drinks; 64+ oz Estimated total protein intake: 60+ grams  Medications: See list Supplementation:  [employer] chewable + 3 TUMS  Using straws: no Drinking while eating: no Having you been chewing well: yes Chewing/swallowing difficulties: no Changes in vision:  no Changes to mood/headaches: no Hair loss/Changes to skin/Changes to nails: no, no, no Any difficulty focusing or concentrating: no Sweating: no Dizziness/Lightheaded: once after BELT class Palpitations: no  Carbonated beverages: no N/V/D/C/GAS: yes with ribs, no, yes taking Miralax, sometimes when drinking too fast Abdominal Pain: once on lower right side Dumping syndrome: no Last Lap-Band fill: N/A  Recent physical activity: BELT 60 min, 3x/week  Progress Towards Goal(s):  In progress.  Handouts given during visit include:  none   Nutritional Diagnosis:  [place]-3.3 Overweight/obesity related to past poor dietary habits and physical inactivity as evidenced by patient w/ recent sleeve gastrectomy surgery following dietary guidelines for continued weight loss.     Intervention:  Nutrition education and counseling. Pt was educated and counseled on ways to add complex carbohydrates to eating regimen and staying hydrated during the day.  Goals: - Add in fruit option in the morning of BELT classes along with protein.  - Stay hydrated during workouts. Pack water bottle in gym bag.  - Can add in starchy vegetables: peas, corn, and potatoes with meals.  - Eat protein first, non-starchy vegetables next, then starchy vegetables.  - Check out support group.  - Keep up the great work!  Teaching Method Utilized:  Visual Auditory Hands on  Barriers to learning/adherence to lifestyle change: none  Demonstrated degree of understanding via:  Teach Back   Monitoring/Evaluation:  Dietary intake, exercise, lap band fills, and body weight. Follow up in 3 months for  9 month post-op visit.

## 2017-12-04 NOTE — Patient Instructions (Addendum)
-   Add in fruit option in the morning of BELT classes along with protein.   - Stay hydrated during workouts. Pack water bottle in gym bag.   - Can add in starchy vegetables: peas, corn, and potatoes with meals.   - Eat protein first, non-starchy vegetables next, then starchy vegetables.   - Check out support group.   - Keep up the great work!

## 2017-12-06 DIAGNOSIS — Z9884 Bariatric surgery status: Secondary | ICD-10-CM | POA: Diagnosis not present

## 2017-12-08 DIAGNOSIS — G4733 Obstructive sleep apnea (adult) (pediatric): Secondary | ICD-10-CM | POA: Diagnosis not present

## 2017-12-11 ENCOUNTER — Encounter: Payer: 59 | Admitting: Skilled Nursing Facility1

## 2017-12-11 DIAGNOSIS — Z713 Dietary counseling and surveillance: Secondary | ICD-10-CM | POA: Diagnosis not present

## 2017-12-12 ENCOUNTER — Encounter: Payer: Self-pay | Admitting: Skilled Nursing Facility1

## 2017-12-12 NOTE — Progress Notes (Signed)
Follow-up visit: 6 Months Post-Operative Sleeve gastrectomy Surgery  Medical Nutrition Therapy:  Appt start time: 10:05 end time: 10:36.  Primary concerns today: Post-operative Bariatric Surgery Nutrition Management.  Non scale victories: learning to listen to body, can tell changes in body, more active, feels great, having to buy new clothes, from size 26-28/4XL to size 18-20, able to exercise more  Information Reviewed/ Discussed During Appointment: -Review of composition scale numbers -Fluid requirements (64-100 ounces) -Protein requirements (60-80g) -Strategies for tolerating diet -Advancement of diet to include Starchy vegetables -Barriers to inclusion of new foods -Inclusion of appropriate multivitamin and calcium supplements  -Exercise recommendations   Handouts given during visit include:  Phase V diet Progression   Goals Sheet  The Benefits of Exercise are endless.....  Support Group Topics  Surgery date: 06/04/2017 Surgery type: Sleeve Start weight at [facility]: 336.7 Weight today: 254.0  (12/04/2017) Weight change: 44 lbs loss from 298.0 lbs (08/01/2017) Total weight lost: 82.7 lbs Weight loss goal: be more active, able to keep up with younger family members  TANITA  BODY COMP RESULTS  06/19/2017 12/04/2017   BMI (kg/m^2) 48 38.6   Fat Mass (lbs) 176.2 125.0   Fat Free Mass (lbs) 139.6 129.0   Total Body Water (lbs) 104.8 94.8    Pt states things are getting monotonous and wants to add variety to food. Pt states she feels like the option to eat more is taken away due to surgery. Pt is doing well!  Pt states she can tolerate fish, chicken, and any ground meat. Pt states bowel movements are every other day to every 3 days. Pt states she is in the process of buying a new home and moving.   Preferred Learning Style:   No preference indicated   Learning Readiness:   Ready  Change in progress  24-hr recall: B (AM): eggs, bacon, sausage (21g) or 1/2 protein  drink (10g) + P3 packet (15g) Snk (AM): peanut butter cup (7g) L (PM): seafood salad (21g)  Snk (PM): nuts or cheese (7g)  D (PM): baked crab cake (21g)  Snk (PM): none  Fluid intake: water (68 oz), Nature's Twist, diet green tea, protein drinks; 64+ oz Estimated total protein intake: 60+ grams  Medications: See list Supplementation:  [facility] chewable + 3 TUMS  Using straws: no Drinking while eating: no Having you been chewing well: yes Chewing/swallowing difficulties: no Changes in vision: no Changes to mood/headaches: no Hair loss/Changes to skin/Changes to nails: no, no, no Any difficulty focusing or concentrating: no Sweating: no Dizziness/Lightheaded: once after BELT class Palpitations: no  Carbonated beverages: no N/V/D/C/GAS: yes with ribs, no, yes taking Miralax, sometimes when drinking too fast Abdominal Pain: once on lower right side Dumping syndrome: no Last Lap-Band fill: N/A  Recent physical activity: BELT 60 min, 3x/week  Progress Towards Goal(s):  In progress.   Nutritional Diagnosis:  [place]-3.3 Overweight/obesity related to past poor dietary habits and physical inactivity as evidenced by patient w/ recent sleeve gastrectomy surgery following dietary guidelines for continued weight loss.     Intervention:  Nutrition education and counseling. Pt was educated and counseled on ways to add complex carbohydrates to eating regimen and staying hydrated during the day.  Goals: I will eat non-starchy vegetables at least 2 separate times in a day 7 days a week by 12/25/2017 I will conduct 5 stress reducing activities a week by 01/19/2018   Teaching Method Utilized:  Visual Auditory Hands on  Barriers to learning/adherence to lifestyle change: none  Demonstrated degree of understanding via:  Teach Back   Monitoring/Evaluation:  Dietary intake, exercise,  and body weight. Follow up in 3 months for 9 month post-op visit.

## 2017-12-25 DIAGNOSIS — Z01419 Encounter for gynecological examination (general) (routine) without abnormal findings: Secondary | ICD-10-CM | POA: Diagnosis not present

## 2018-01-01 ENCOUNTER — Ambulatory Visit (INDEPENDENT_AMBULATORY_CARE_PROVIDER_SITE_OTHER): Payer: 59 | Admitting: Adult Health

## 2018-01-01 ENCOUNTER — Encounter: Payer: Self-pay | Admitting: Adult Health

## 2018-01-01 ENCOUNTER — Other Ambulatory Visit: Payer: Self-pay

## 2018-01-01 VITALS — BP 144/94 | HR 59 | Resp 18 | Ht 68.0 in | Wt 258.0 lb

## 2018-01-01 DIAGNOSIS — Z9989 Dependence on other enabling machines and devices: Secondary | ICD-10-CM

## 2018-01-01 DIAGNOSIS — G4733 Obstructive sleep apnea (adult) (pediatric): Secondary | ICD-10-CM

## 2018-01-01 NOTE — Progress Notes (Addendum)
PATIENT: Leslie Mason DOB: 02/18/1971  REASON FOR VISIT: follow up- OSA on CPAP HISTORY FROM: patient  HISTORY OF PRESENT ILLNESS: Today 01/01/18: Leslie Mason is a 47 year old female with a history of obstructive sleep apnea on CPAP.  Her CPAP download indicates that she use her machine 24 out of 30 days for compliance of 80%.  She use her machine greater than 4 hours each night.  On average she uses her machine 5 hours and 55 minutes.  Her residual AHI is 0.8.  She is on AutoSet 5 to 12 cm of water.  Her leak in the 95th percentile is 33.5.  She states that she has found that if she wears her hair pulled back the leak is worse.  She is also not changed out her supplies since March.  She returns today for an evaluation.  HISTORY 07/04/2017: I reviewed her AutoPap compliance data from 05/29/2017 through 06/27/2017 which is a total of 30 days, during which time she used her AutoPap 25 days with percent used days greater than 4 hours at 80%, indicating very good compliance with an average usage of 7 hours and 9 minutes, residual AHI at goal at 0.8 per hour, 95th percentile pressure of 9.9 cm, leak high with the 95th percentile at 27.7 L/m, pressure range of 5-12 cm. She reports that the mask or headgear is causing her marks on her face. Of note, she had interim laparoscopic gastric sleeve bariatric surgery on 06/04/2017. She has lost about 30 pounds since her bariatric surgery. She feels improved in terms of sleep quality and daytime tiredness, no longer has gasping sensation at night.    REVIEW OF SYSTEMS: Out of a complete 14 system review of symptoms, the patient complains only of the following symptoms, and all other reviewed systems are negative.  See HPI ESS 2  ALLERGIES: Allergies  Allergen Reactions  . Contrave [Naltrexone-Bupropion Hcl Er] Other (See Comments)    constipation    HOME MEDICATIONS: Outpatient Medications Prior to Visit  Medication Sig Dispense Refill  .  albuterol (PROVENTIL HFA;VENTOLIN HFA) 108 (90 Base) MCG/ACT inhaler Inhale 2 puffs into the lungs every 6 (six) hours as needed for wheezing or shortness of breath.    Marland Kitchen CALCIUM PO Take 1,500 mg by mouth 2 (two) times daily.    . cholecalciferol (VITAMIN D) 1000 units tablet Take 10,000 Units by mouth daily.    . fluticasone (FLONASE) 50 MCG/ACT nasal spray Place 1 spray into both nostrils daily as needed for allergies or rhinitis.    . Multiple Vitamin (MULTIVITAMIN WITH MINERALS) TABS tablet Take 1 tablet by mouth daily.    . nebivolol (BYSTOLIC) 5 MG tablet Take 5 mg by mouth daily.    . medroxyPROGESTERone (PROVERA) 10 MG tablet Take 10 mg by mouth See admin instructions. Take for 10 days after missing cycle for 3 months    . pantoprazole (PROTONIX) 40 MG tablet Take 40 mg by mouth daily.     No facility-administered medications prior to visit.     PAST MEDICAL HISTORY: Past Medical History:  Diagnosis Date  . Asthma   . Blood glucose elevated   . Chronic bronchitis (HCC)   . Chronic low back pain without sciatica   . Family history of adverse reaction to anesthesia    mother has hx of seizures, anesthesia made seizures more freuqeunt, had to be monitored inhospital longer ; procedure wss to remove a" polyp from her stomach or something"   . Hay  fever   . Hypertension   . Pre-diabetes   . Sleep apnea    cpap use regular     PAST SURGICAL HISTORY: Past Surgical History:  Procedure Laterality Date  . ANKLE FRACTURE SURGERY Right    x3 , also achilles heel problems   . GINGIVECTOMY     early 2000s  . LAPAROSCOPIC GASTRIC SLEEVE RESECTION N/A 06/04/2017   Procedure: LAPAROSCOPIC GASTRIC SLEEVE RESECTION WITH UPPER ENDO, HIATAL HERNIA REPAIR;  Surgeon: Gaynelle Adu, MD;  Location: [facility];  Service: General;  Laterality: N/A;  . SHOULDER SURGERY Right 2002   rotator cuff repair     FAMILY HISTORY: Family History  Problem Relation Age of Onset  . Breast cancer Maternal Aunt     . Asthma Other   . Cancer Other   . Hypertension Other   . Prostate cancer Father     SOCIAL HISTORY: Social History   Socioeconomic History  . Marital status: Single    Spouse name: Not on file  . Number of children: Not on file  . Years of education: Not on file  . Highest education level: Not on file  Occupational History  . Not on file  Social Needs  . Financial resource strain: Not on file  . Food insecurity:    Worry: Not on file    Inability: Not on file  . Transportation needs:    Medical: Not on file    Non-medical: Not on file  Tobacco Use  . Smoking status: Never Smoker  . Smokeless tobacco: Never Used  Substance and Sexual Activity  . Alcohol use: Yes    Comment: socailly   . Drug use: No  . Sexual activity: Not on file  Lifestyle  . Physical activity:    Days per week: Not on file    Minutes per session: Not on file  . Stress: Not on file  Relationships  . Social connections:    Talks on phone: Not on file    Gets together: Not on file    Attends religious service: Not on file    Active member of club or organization: Not on file    Attends meetings of clubs or organizations: Not on file    Relationship status: Not on file  . Intimate partner violence:    Fear of current or ex partner: Not on file    Emotionally abused: Not on file    Physically abused: Not on file    Forced sexual activity: Not on file  Other Topics Concern  . Not on file  Social History Narrative  . Not on file      PHYSICAL EXAM  Vitals:   01/01/18 1007  BP: (!) 144/94  Pulse: (!) 59  Resp: 18  Weight: 258 lb (117 kg)  Height: 5\' 8"  (1.727 m)   Body mass index is 39.23 kg/m.  Generalized: Well developed, in no acute distress   Neurological examination  Mentation: Alert oriented to time, place, history taking. Follows all commands speech and language fluent Cranial nerve II-XII: Pupils were equal round reactive to light. Extraocular movements were full,  visual field were full on confrontational test. Facial sensation and strength were normal. Uvula tongue midline. Head turning and shoulder shrug  were normal and symmetric.  Mallampati 3+ Motor: The motor testing reveals 5 over 5 strength of all 4 extremities. Good symmetric motor tone is noted throughout.  Sensory: Sensory testing is intact to soft touch on all 4 extremities. No  evidence of extinction is noted.  Coordination: Cerebellar testing reveals good finger-nose-finger and heel-to-shin bilaterally.  Gait and station: Gait is normal. Tandem gait is normal. Romberg is negative. No drift is seen.  Reflexes: Deep tendon reflexes are symmetric and normal bilaterally.   DIAGNOSTIC DATA (LABS, IMAGING, TESTING) - I reviewed patient records, labs, notes, testing and imaging myself where available.  Lab Results  Component Value Date   WBC 8.3 06/06/2017   HGB 12.1 06/06/2017   HCT 38.7 06/06/2017   MCV 81.8 06/06/2017   PLT 213 06/06/2017      Component Value Date/Time   NA 134 (L) 06/05/2017 0515   K 4.2 06/05/2017 0515   CL 99 (L) 06/05/2017 0515   CO2 26 06/05/2017 0515   GLUCOSE 126 (H) 06/05/2017 0515   BUN 10 06/05/2017 0515   CREATININE 0.87 06/05/2017 0515   CALCIUM 9.4 06/05/2017 0515   PROT 7.6 06/05/2017 0515   ALBUMIN 3.8 06/05/2017 0515   AST 29 06/05/2017 0515   ALT 22 06/05/2017 0515   ALKPHOS 92 06/05/2017 0515   BILITOT 0.8 06/05/2017 0515   GFRNONAA >60 06/05/2017 0515   GFRAA >60 06/05/2017 0515      ASSESSMENT AND PLAN 47 y.o. year old female  has a past medical history of Asthma, Blood glucose elevated, Chronic bronchitis (HCC), Chronic low back pain without sciatica, Family history of adverse reaction to anesthesia, Hay fever, Hypertension, Pre-diabetes, and Sleep apnea. here with:  1.  Obstructive sleep apnea on CPAP  Patient CPAP download shows good compliance and treatment of her apnea.  She is encouraged to use the CPAP nightly.  Also encouraged  the patient to change out her supplies every 3 to 4 months.  She voiced understanding.  She will follow-up in 1 year or sooner if needed.   I spent 15 minutes with the patient. 50% of this time was spent reviewing CPAP download   Butch PennyMegan Naia Ruff, MSN, NP-C 01/01/2018, 10:19 AM [facility] [address], [address] [place], [place] [address] [phone]  I reviewed the above note and documentation by the Nurse Practitioner and agree with the history, physical exam, assessment and plan as outlined above.  Huston FoleySaima Athar, MD, PhD [facility]

## 2018-01-01 NOTE — Patient Instructions (Signed)
Your Plan:  Continue using CPAP nightly and >4 hours each night  Change out supplies every 3-4 months  Please call [facility] at [phone], and press option 1 when prompted. Their customer service representatives will be glad to assist you. If they are unable to answer, please leave a message and they will call you back. Make sure to leave your name and return phone number.      Thank you for coming [facility] see us at [facility]. I hope we have been able to provide you high quality care today.  You may receive a patient satisfaction survey over the next few weeks. We would appreciate your feedback and comments so that we may continue to improve ourselves and the health of our patients.

## 2018-01-07 DIAGNOSIS — G4733 Obstructive sleep apnea (adult) (pediatric): Secondary | ICD-10-CM | POA: Diagnosis not present

## 2018-01-09 DIAGNOSIS — M67961 Unspecified disorder of synovium and tendon, right lower leg: Secondary | ICD-10-CM | POA: Diagnosis not present

## 2018-01-09 DIAGNOSIS — M79671 Pain in right foot: Secondary | ICD-10-CM | POA: Diagnosis not present

## 2018-01-09 DIAGNOSIS — R600 Localized edema: Secondary | ICD-10-CM | POA: Diagnosis not present

## 2018-01-21 ENCOUNTER — Other Ambulatory Visit: Payer: Self-pay | Admitting: Obstetrics and Gynecology

## 2018-01-21 DIAGNOSIS — Z1231 Encounter for screening mammogram for malignant neoplasm of breast: Secondary | ICD-10-CM

## 2018-01-30 DIAGNOSIS — M67961 Unspecified disorder of synovium and tendon, right lower leg: Secondary | ICD-10-CM | POA: Diagnosis not present

## 2018-02-04 DIAGNOSIS — M67961 Unspecified disorder of synovium and tendon, right lower leg: Secondary | ICD-10-CM | POA: Diagnosis not present

## 2018-02-05 ENCOUNTER — Ambulatory Visit: Payer: Self-pay | Admitting: Registered"

## 2018-02-07 DIAGNOSIS — G4733 Obstructive sleep apnea (adult) (pediatric): Secondary | ICD-10-CM | POA: Diagnosis not present

## 2018-02-07 DIAGNOSIS — M67961 Unspecified disorder of synovium and tendon, right lower leg: Secondary | ICD-10-CM | POA: Diagnosis not present

## 2018-02-12 ENCOUNTER — Ambulatory Visit: Payer: Self-pay | Admitting: Registered"

## 2018-02-15 DIAGNOSIS — M67961 Unspecified disorder of synovium and tendon, right lower leg: Secondary | ICD-10-CM | POA: Diagnosis not present

## 2018-02-25 DIAGNOSIS — M67961 Unspecified disorder of synovium and tendon, right lower leg: Secondary | ICD-10-CM | POA: Diagnosis not present

## 2018-02-26 ENCOUNTER — Encounter: Payer: Self-pay | Admitting: Registered"

## 2018-02-26 ENCOUNTER — Encounter: Payer: 59 | Attending: General Surgery | Admitting: Registered"

## 2018-02-26 ENCOUNTER — Ambulatory Visit: Payer: 59

## 2018-02-26 ENCOUNTER — Ambulatory Visit
Admission: RE | Admit: 2018-02-26 | Discharge: 2018-02-26 | Disposition: A | Discharge status: Home / Self Care | Payer: 59 | Source: Ambulatory Visit | Attending: Obstetrics and Gynecology | Admitting: Obstetrics and Gynecology

## 2018-02-26 DIAGNOSIS — Z6838 Body mass index (BMI) 38.0-38.9, adult: Secondary | ICD-10-CM | POA: Diagnosis not present

## 2018-02-26 DIAGNOSIS — Z713 Dietary counseling and surveillance: Secondary | ICD-10-CM | POA: Insufficient documentation

## 2018-02-26 DIAGNOSIS — Z1231 Encounter for screening mammogram for malignant neoplasm of breast: Secondary | ICD-10-CM

## 2018-02-26 DIAGNOSIS — E669 Obesity, unspecified: Secondary | ICD-10-CM

## 2018-02-26 DIAGNOSIS — Z9884 Bariatric surgery status: Secondary | ICD-10-CM | POA: Insufficient documentation

## 2018-02-26 NOTE — Patient Instructions (Signed)
Keep up the great work!

## 2018-02-26 NOTE — Progress Notes (Signed)
Follow-up visit: 9 Months Post-Operative Sleeve gastrectomy Surgery  Medical Nutrition Therapy:  Appt start time: 11:00 end time: 11:30  Primary concerns today: Post-operative Bariatric Surgery Nutrition Management.  Non scale victories: learning to listen to body, can tell changes in body, more active, feels great, having to buy new clothes, from size 26-28/4XL to size 18-20 (pants), 14-16 (shirts), able to exercise more  Surgery date: 06/04/2017 Surgery type: Sleeve [facility]: 336.7 Weight today: 243.8 Weight change: 10.2 lbs loss from 254.0 lbs (12/04/2017) Total weight lost: 92.9 lbs Weight loss goal: be more active, able to keep up with younger family members   TANITA  BODY COMP RESULTS  06/19/2017 12/04/2017 02/26/2018   BMI (kg/m^2) 48 38.6 Used regular scale   Fat Mass (lbs) 176.2 125.0    Fat Free Mass (lbs) 139.6 129.0    Total Body Water (lbs) 104.8 94.8     Pt states she is doing well. Pt states she has started attending [employer] and plans to attend [employer] on "off days". Pt states she can tolerate ground red meat.   Pt states she can tolerate fish, chicken, and any ground meat. Pt states bowel movements are every other day to every 3 days. Pt states she is in the process of buying a new home.  Preferred Learning Style:   No preference indicated   Learning Readiness:   Ready  Change in progress  24-hr recall: B (AM): eggs, bacon, sausage (21g) or 1/2 protein drink (10g) + P3 packet (15g) Snk (AM): fruit L (PM): seafood salad (21g)  Snk (PM): nuts or cheese (7g) or peanut butter cup (7g) or carrots + hummus D (PM): baked crab cake (21g) or chicken or ground beef Snk (PM): none  Fluid intake: water (68 oz), Nature's Twist, diet green tea, protein drinks; 64+ oz Estimated total protein intake: 60+ grams  Medications: See list Supplementation:  [employer] [facility] capsule + 3 calcium tablets  Using straws: no Drinking while eating: no Having you been  chewing well: yes Chewing/swallowing difficulties: no Changes in vision: no Changes to mood/headaches: no Hair loss/Changes to skin/Changes to nails: no, no, no Any difficulty focusing or concentrating: no Sweating: no Dizziness/Lightheaded: no Palpitations: no  Carbonated beverages: no N/V/D/C/GAS: no, no, no,once, no Abdominal Pain: no Dumping syndrome: no Last Lap-Band fill: N/A  Recent physical activity: [employer] 60 min, 3x/week and [employer] 2x/week  Progress Towards Goal(s):  In progress.   Nutritional Diagnosis:  [place]-3.3 Overweight/obesity related to past poor dietary habits and physical inactivity as evidenced by patient w/ recent sleeve gastrectomy surgery following dietary guidelines for continued weight loss.     Intervention:  Nutrition education and counseling. Pt was educated and counseled on physical activity and planning.    Goals: - Keep up the great work!  Teaching Method Utilized:  Visual Auditory Hands on  Barriers to learning/adherence to lifestyle change: none  Demonstrated degree of understanding via:  Teach Back   Monitoring/Evaluation:  Dietary intake, exercise,  and body weight. Follow up in 3 months for 12 month post-op visit.

## 2018-02-28 DIAGNOSIS — M67961 Unspecified disorder of synovium and tendon, right lower leg: Secondary | ICD-10-CM | POA: Diagnosis not present

## 2018-03-13 DIAGNOSIS — M67969 Unspecified disorder of synovium and tendon, unspecified lower leg: Secondary | ICD-10-CM | POA: Diagnosis not present

## 2018-03-13 DIAGNOSIS — M7661 Achilles tendinitis, right leg: Secondary | ICD-10-CM | POA: Diagnosis not present

## 2018-05-21 DIAGNOSIS — R7303 Prediabetes: Secondary | ICD-10-CM | POA: Diagnosis not present

## 2018-05-21 DIAGNOSIS — Z23 Encounter for immunization: Secondary | ICD-10-CM | POA: Diagnosis not present

## 2018-05-21 DIAGNOSIS — J309 Allergic rhinitis, unspecified: Secondary | ICD-10-CM | POA: Diagnosis not present

## 2018-05-21 DIAGNOSIS — I10 Essential (primary) hypertension: Secondary | ICD-10-CM | POA: Diagnosis not present

## 2018-06-18 ENCOUNTER — Ambulatory Visit: Payer: 59

## 2018-06-18 ENCOUNTER — Encounter: Payer: 59 | Attending: General Surgery | Admitting: Skilled Nursing Facility1

## 2018-06-21 ENCOUNTER — Encounter: Payer: Self-pay | Admitting: Skilled Nursing Facility1

## 2018-06-21 NOTE — Progress Notes (Signed)
Bariatric Class:  Appt start time: 6:00 end time: 7:00  12 Month Post-Operative Nutrition Class  //Patient was seen on 06/19/2018 /2019 for [facility]ative [facility].   Surgery date: 06/04/2017 Surgery type: Sleeve Start weight at [facility]: 336.7 Weight today: 236.4 Weight change:   TANITA  BODY COMP RESULTS  06/21/2018   BMI (kg/m^2) 34.9   Fat Mass (lbs) 101   Fat Free Mass (lbs) 135.4   Total Body Water (lbs) 98.4   The following the learning objectives were met by the patient during this course:  Review of TANITA scale information  Share and discuss bariatric surgery successes and non-scale victories  Identifies Phase VII (Maintenance Phase) Dietary Goals which will be lifelong  Identifies appropriate sources of fluids, proteins, non-starchy vegetables, and complex carbohydrates  Identifies well-balanced meals  Identifies portion control   Identifies appropriate multivitamin and calcium sources post-operatively  Describes the need for physical activity post-operatively and will follow MD recommendations  Identifies and describes SMART goals   Creates at least 2 SMART goals to begin immediately  States when to call healthcare provider regarding medication questions or post-operative complications  Handouts given during class include:  Phase VII: Maintenance Phase-Lifelong  Follow-Up Plan: Patient will follow-up at [facility] for on-going post-op nutrition visits.   Pt set Goals: -I will chew 30 times by 07/13/18 -I will eat 3 new non starchy vegetables every week by February 12th, 2020

## 2018-07-10 DIAGNOSIS — K912 Postsurgical malabsorption, not elsewhere classified: Secondary | ICD-10-CM | POA: Diagnosis not present

## 2018-07-10 DIAGNOSIS — R5383 Other fatigue: Secondary | ICD-10-CM | POA: Diagnosis not present

## 2018-07-10 DIAGNOSIS — E669 Obesity, unspecified: Secondary | ICD-10-CM | POA: Diagnosis not present

## 2018-07-10 DIAGNOSIS — Z9884 Bariatric surgery status: Secondary | ICD-10-CM | POA: Diagnosis not present

## 2018-07-10 DIAGNOSIS — I10 Essential (primary) hypertension: Secondary | ICD-10-CM | POA: Diagnosis not present

## 2018-07-23 ENCOUNTER — Ambulatory Visit: Payer: 59

## 2018-11-20 DIAGNOSIS — R7303 Prediabetes: Secondary | ICD-10-CM | POA: Diagnosis not present

## 2018-11-20 DIAGNOSIS — Z0000 Encounter for general adult medical examination without abnormal findings: Secondary | ICD-10-CM | POA: Diagnosis not present

## 2018-11-20 DIAGNOSIS — D509 Iron deficiency anemia, unspecified: Secondary | ICD-10-CM | POA: Diagnosis not present

## 2018-11-27 DIAGNOSIS — R7303 Prediabetes: Secondary | ICD-10-CM | POA: Diagnosis not present

## 2018-11-27 DIAGNOSIS — I10 Essential (primary) hypertension: Secondary | ICD-10-CM | POA: Diagnosis not present

## 2018-11-27 DIAGNOSIS — E559 Vitamin D deficiency, unspecified: Secondary | ICD-10-CM | POA: Diagnosis not present

## 2019-01-06 ENCOUNTER — Encounter: Payer: Self-pay | Admitting: Adult Health

## 2019-01-07 ENCOUNTER — Encounter: Payer: Self-pay | Admitting: Adult Health

## 2019-01-08 ENCOUNTER — Ambulatory Visit (INDEPENDENT_AMBULATORY_CARE_PROVIDER_SITE_OTHER): Payer: 59 | Admitting: Adult Health

## 2019-01-08 ENCOUNTER — Encounter: Payer: Self-pay | Admitting: Adult Health

## 2019-01-08 ENCOUNTER — Other Ambulatory Visit: Payer: Self-pay

## 2019-01-08 VITALS — BP 147/102 | HR 55 | Temp 97.7°F | Ht 68.0 in | Wt 235.2 lb

## 2019-01-08 DIAGNOSIS — Z9989 Dependence on other enabling machines and devices: Secondary | ICD-10-CM

## 2019-01-08 DIAGNOSIS — G4733 Obstructive sleep apnea (adult) (pediatric): Secondary | ICD-10-CM | POA: Diagnosis not present

## 2019-01-08 NOTE — Patient Instructions (Addendum)
Continue using CPAP nightly and greater than 4 hours each night If your symptoms worsen or you develop new symptoms please let us know.   Please call [facility] at [phone], and press option 1 when prompted. Their customer service representatives will be glad to assist you. If they are unable to answer, please leave a message and they will call you back. Make sure to leave your name and return phone number.  

## 2019-01-08 NOTE — Progress Notes (Signed)
[facility], Charmian Muff did receive cpap orders for pt.

## 2019-01-08 NOTE — Progress Notes (Addendum)
PATIENT: Leslie MaserMelinda Bertino DOB: 03/10/1971  REASON FOR VISIT: follow up HISTORY FROM: patient  HISTORY OF PRESENT ILLNESS: Today 01/08/19:  Ms. Leslie Mason is a 48 year old female with a history of obstructive sleep apnea on CPAP.  Her download indicates that she use her machine 6 out of 30 days for compliance to 20%.  She use her machine greater than 4 hours only 2 days.  On average she uses her machine 2 hours and 21 minutes.  Her residual AHI is 2.4 on 5 to 12 cm of water with EPR of 1.  Her leak in the 95th percentile is 19.2.  She reports that she has not been using the machine because she has been in the process of moving and unpacking.  She reports in the last several days she has restarted using it at night.  She plans to be consistent with this.  She does report that she needs new supplies.  She returns today for follow-up.   HISTORY 01/01/18: Ms. Leslie Mason is a 48 year old female with a history of obstructive sleep apnea on CPAP.  Her CPAP download indicates that she use her machine 24 out of 30 days for compliance of 80%.  She use her machine greater than 4 hours each night.  On average she uses her machine 5 hours and 55 minutes.  Her residual AHI is 0.8.  She is on AutoSet 5 to 12 cm of water.  Her leak in the 95th percentile is 33.5.  She states that she has found that if she wears her hair pulled back the leak is worse.  She is also not changed out her supplies since March.  She returns today for an evaluation.   REVIEW OF SYSTEMS: Out of a complete 14 system review of symptoms, the patient complains only of the following symptoms, and all other reviewed systems are negative.  ALLERGIES: Allergies  Allergen Reactions  . Contrave [Naltrexone-Bupropion Hcl Er] Other (See Comments)    constipation    HOME MEDICATIONS: Outpatient Medications Prior to Visit  Medication Sig Dispense Refill  . albuterol (PROVENTIL HFA;VENTOLIN HFA) 108 (90 Base) MCG/ACT inhaler Inhale 2 puffs into  the lungs every 6 (six) hours as needed for wheezing or shortness of breath.    Marland Kitchen. CALCIUM PO Take 1,500 mg by mouth 2 (two) times daily.    . cholecalciferol (VITAMIN D) 1000 units tablet Take 10,000 Units by mouth daily.    . fluticasone (FLONASE) 50 MCG/ACT nasal spray Place 1 spray into both nostrils daily as needed for allergies or rhinitis.    . Multiple Vitamin (MULTIVITAMIN WITH MINERALS) TABS tablet Take 1 tablet by mouth daily.    . nebivolol (BYSTOLIC) 5 MG tablet Take 5 mg by mouth daily.     No facility-administered medications prior to visit.     PAST MEDICAL HISTORY: Past Medical History:  Diagnosis Date  . Asthma   . Blood glucose elevated   . Chronic bronchitis (HCC)   . Chronic low back pain without sciatica   . Family history of adverse reaction to anesthesia    mother has hx of seizures, anesthesia made seizures more freuqeunt, had to be monitored inhospital longer ; procedure wss to remove a" polyp from her stomach or something"   . Hay fever   . Hypertension   . Pre-diabetes   . Sleep apnea    cpap use regular     PAST SURGICAL HISTORY: Past Surgical History:  Procedure Laterality Date  . ANKLE FRACTURE  SURGERY Right    x3 , also achilles heel problems   . GINGIVECTOMY     early 2000s  . LAPAROSCOPIC GASTRIC SLEEVE RESECTION N/A 06/04/2017   Procedure: LAPAROSCOPIC GASTRIC SLEEVE RESECTION WITH UPPER ENDO, HIATAL HERNIA REPAIR;  Surgeon: Greer Pickerel, MD;  Location: [facility];  Service: General;  Laterality: N/A;  . SHOULDER SURGERY Right 2002   rotator cuff repair     FAMILY HISTORY: Family History  Problem Relation Age of Onset  . Breast cancer Maternal Aunt   . Asthma Other   . Cancer Other   . Hypertension Other   . Prostate cancer Father     SOCIAL HISTORY: Social History   Socioeconomic History  . Marital status: Single    Spouse name: Not on file  . Number of children: Not on file  . Years of education: Not on file  . Highest education  level: Not on file  Occupational History  . Not on file  Social Needs  . Financial resource strain: Not on file  . Food insecurity    Worry: Not on file    Inability: Not on file  . Transportation needs    Medical: Not on file    Non-medical: Not on file  Tobacco Use  . Smoking status: Never Smoker  . Smokeless tobacco: Never Used  Substance and Sexual Activity  . Alcohol use: Yes    Comment: socailly   . Drug use: No  . Sexual activity: Not on file  Lifestyle  . Physical activity    Days per week: Not on file    Minutes per session: Not on file  . Stress: Not on file  Relationships  . Social Herbalist on phone: Not on file    Gets together: Not on file    Attends religious service: Not on file    Active member of club or organization: Not on file    Attends meetings of clubs or organizations: Not on file    Relationship status: Not on file  . Intimate partner violence    Fear of current or ex partner: Not on file    Emotionally abused: Not on file    Physically abused: Not on file    Forced sexual activity: Not on file  Other Topics Concern  . Not on file  Social History Narrative  . Not on file      PHYSICAL EXAM  Vitals:   01/08/19 0926  BP: (!) 147/102  Pulse: (!) 55  Temp: 97.7 F (36.5 C)  Weight: 235 lb 3.2 oz (106.7 kg)  Height: 5\' 8"  (1.727 m)   Body mass index is 35.76 kg/m.    Generalized: Well developed, in no acute distress   Neurological examination  Mentation: Alert oriented to time, place, history taking. Follows all commands speech and language fluent Cranial nerve II-XII:  Extraocular movements were full, visual field were full on confrontational test.  Head turning and shoulder shrug  were normal and symmetric. Motor: The motor testing reveals 5 over 5 strength of all 4 extremities. Good symmetric motor tone is noted throughout.  Sensory: Sensory testing is intact to soft touch on all 4 extremities. No evidence of  extinction is noted.  Gait and station: Gait is normal.   DIAGNOSTIC DATA (LABS, IMAGING, TESTING) - I reviewed patient records, labs, notes, testing and imaging myself where available.  Lab Results  Component Value Date   WBC 8.3 06/06/2017   HGB 12.1 06/06/2017  HCT 38.7 06/06/2017   MCV 81.8 06/06/2017   PLT 213 06/06/2017      Component Value Date/Time   NA 134 (L) 06/05/2017 0515   K 4.2 06/05/2017 0515   CL 99 (L) 06/05/2017 0515   CO2 26 06/05/2017 0515   GLUCOSE 126 (H) 06/05/2017 0515   BUN 10 06/05/2017 0515   CREATININE 0.87 06/05/2017 0515   CALCIUM 9.4 06/05/2017 0515   PROT 7.6 06/05/2017 0515   ALBUMIN 3.8 06/05/2017 0515   AST 29 06/05/2017 0515   ALT 22 06/05/2017 0515   ALKPHOS 92 06/05/2017 0515   BILITOT 0.8 06/05/2017 0515   GFRNONAA >60 06/05/2017 0515   GFRAA >60 06/05/2017 0515   No results found for: CHOL, HDL, LDLCALC, LDLDIRECT, TRIG, CHOLHDL Lab Results  Component Value Date   HGBA1C 5.7 (H) 06/01/2017   No results found for: VITAMINB12 No results found for: TSH    ASSESSMENT AND PLAN 48 y.o. year old female  has a past medical history of Asthma, Blood glucose elevated, Chronic bronchitis (HCC), Chronic low back pain without sciatica, Family history of adverse reaction to anesthesia, Hay fever, Hypertension, Pre-diabetes, and Sleep apnea. here with:  1.  Obstructive sleep apnea on CPAP  The patient CPAP download shows noncompliance.  The patient is encouraged to use her CPAP machine nightly and greater than 4 hours each night.  She is advised that if her symptoms worsen or she develops new symptoms she should let us know.  She will follow-up in 1 year or sooner if needed.   I spent 15 minutes with the patient. 50% of this time was spent with spent reviewing CPAP download   Butch PennyMegan Dolton Shaker, MSN, NP-C 01/08/2019, 9:16 AM [facility] [address], [address] [place], [place] [address] [phone]   I reviewed the  above note and documentation by the Nurse Practitioner and agree with the history, exam, assessment and plan as outlined above. I was immediately available forconsultation. Huston FoleySaima Athar, MD, PhD [facility]

## 2019-01-20 IMAGING — MG DIGITAL SCREENING BILATERAL MAMMOGRAM WITH TOMO AND CAD
8 series · 8 of 24 positions shown · non-contrast
Comparison: Previous exam(s).

CLINICAL DATA: Screening.

EXAM:
DIGITAL SCREENING BILATERAL MAMMOGRAM WITH TOMO AND CAD

[R CC synth-2D]
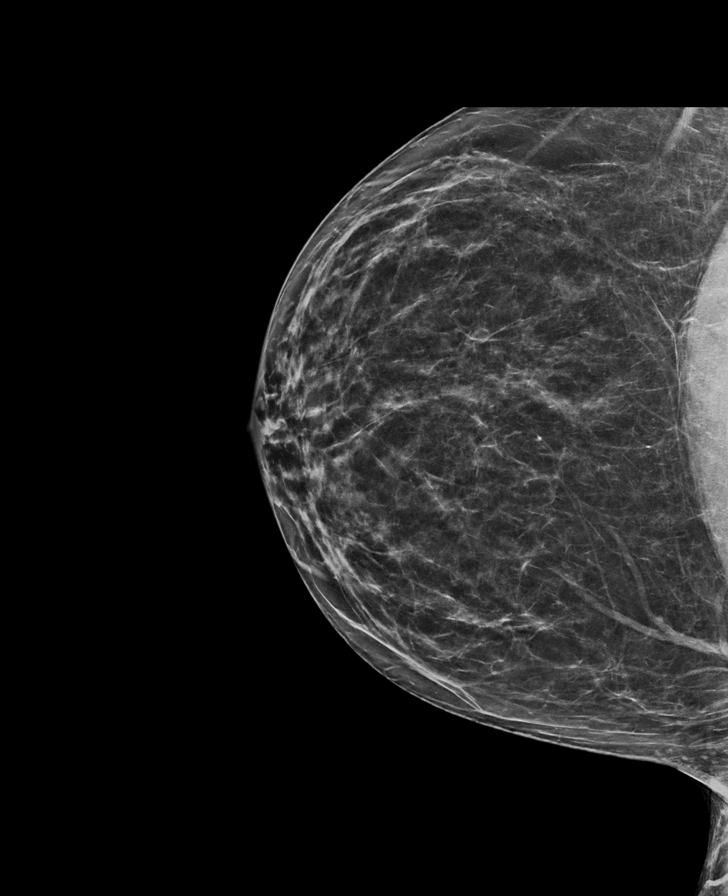

[R MLO synth-2D]
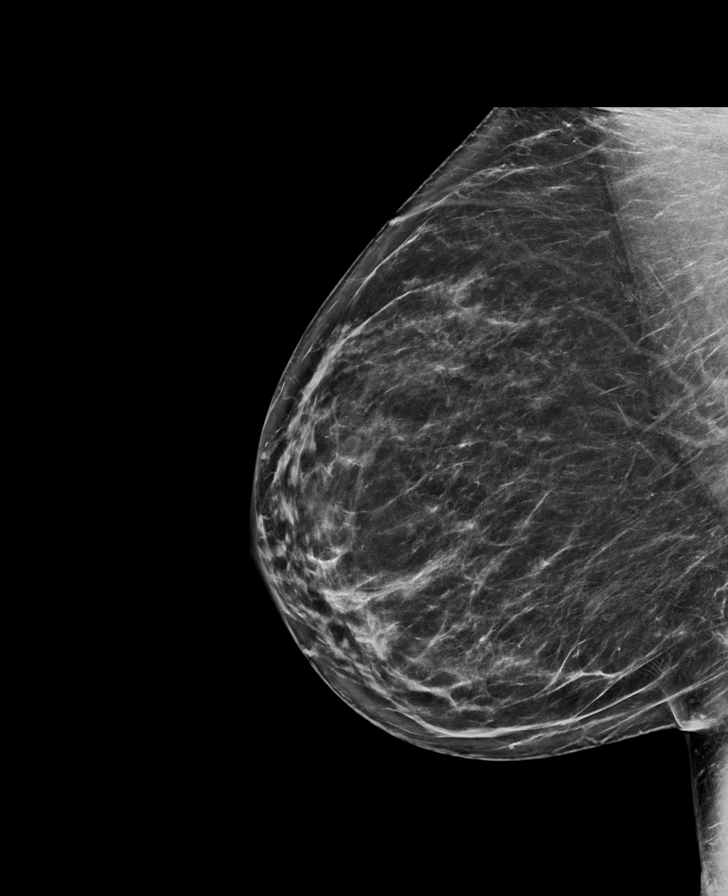

[L CC synth-2D]
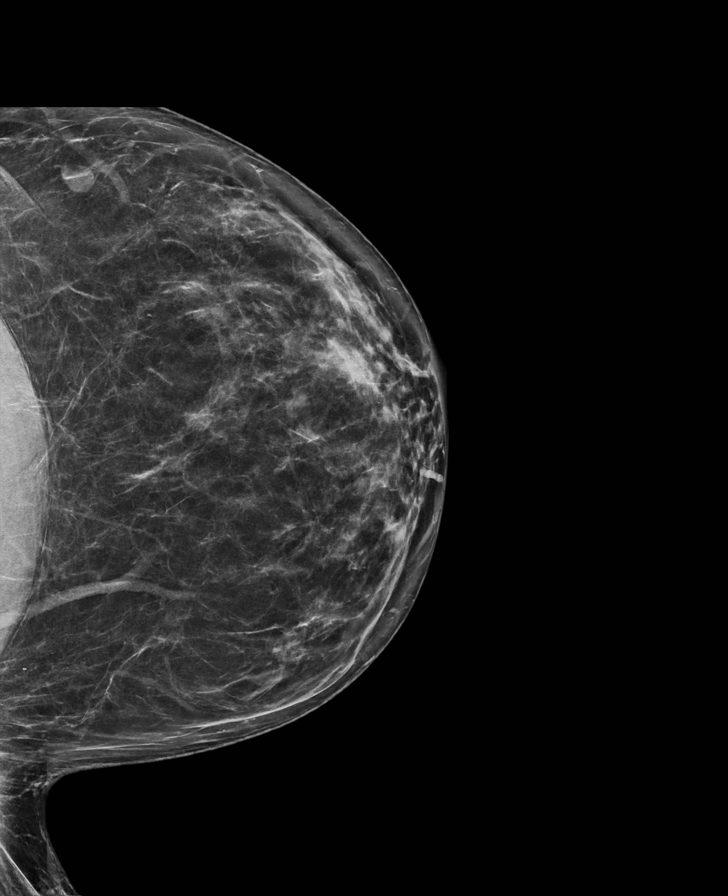

[L MLO synth-2D]
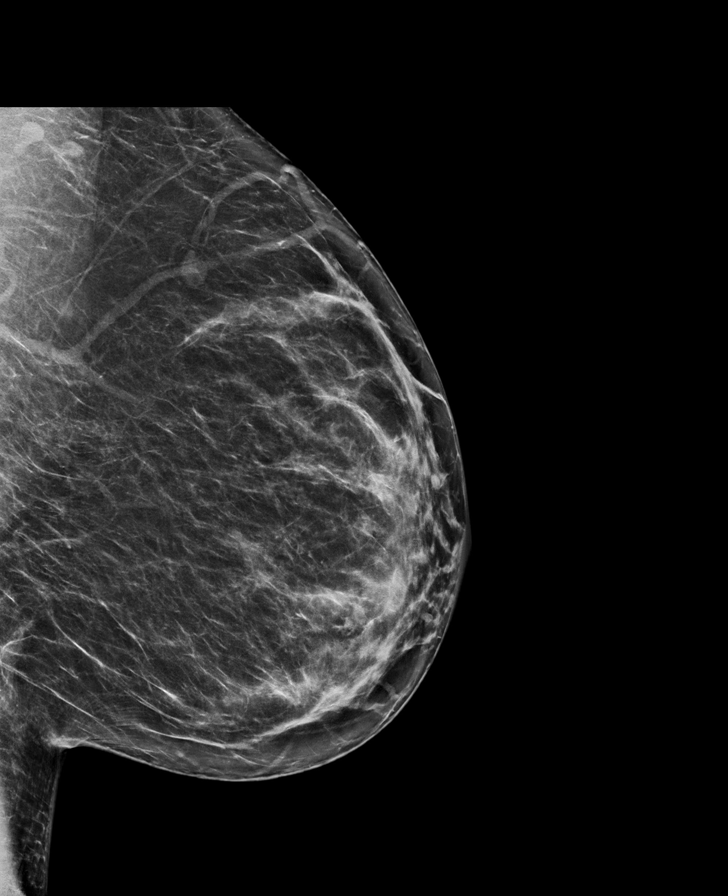

[L CC tomo · tomo slice 37/72.0]
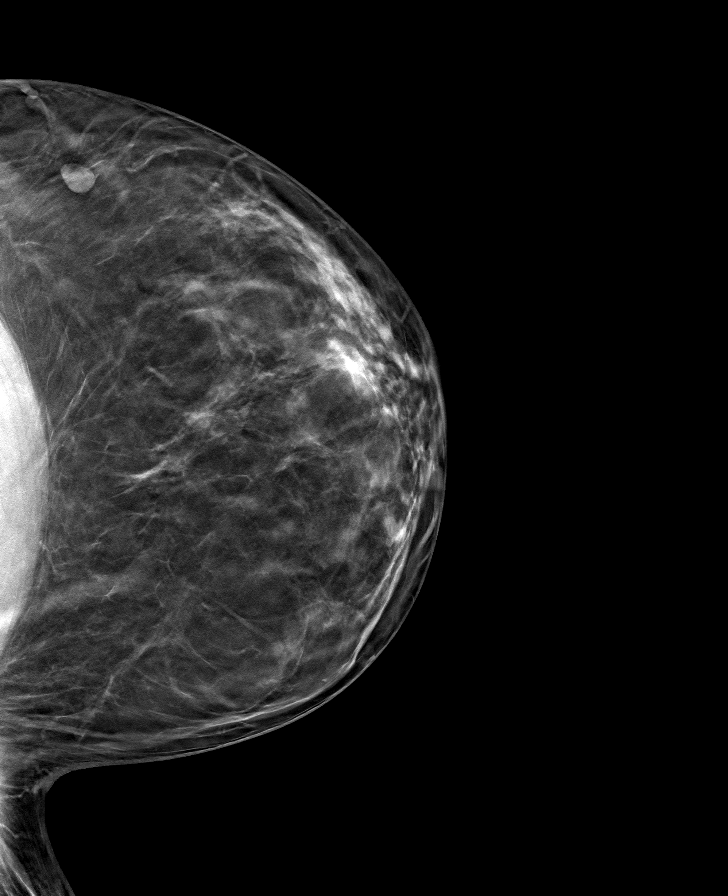

[R MLO tomo · tomo slice 39/76.0]
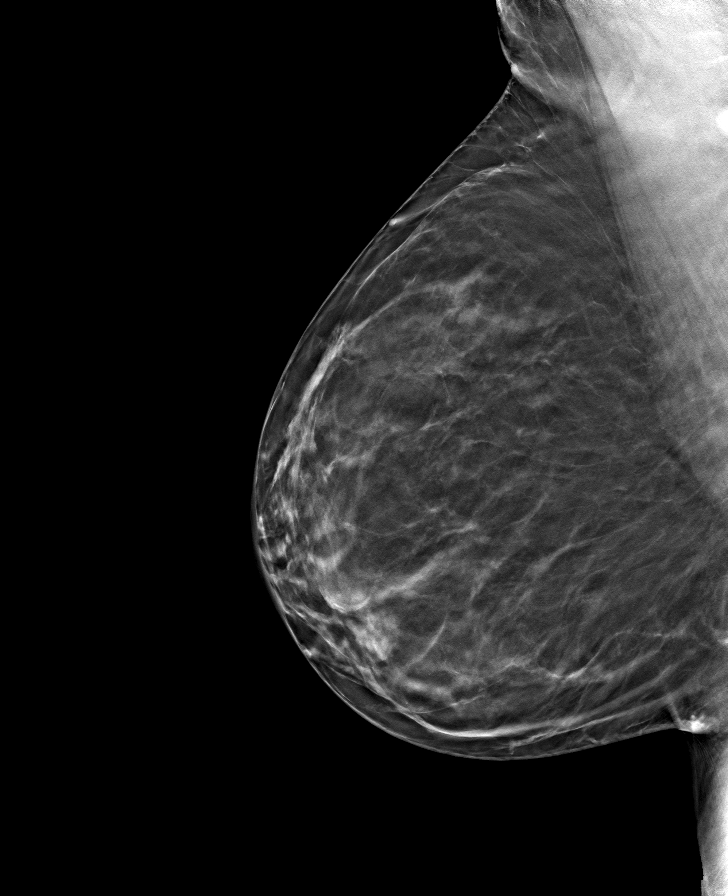

[L MLO tomo · tomo slice 39/76.0]
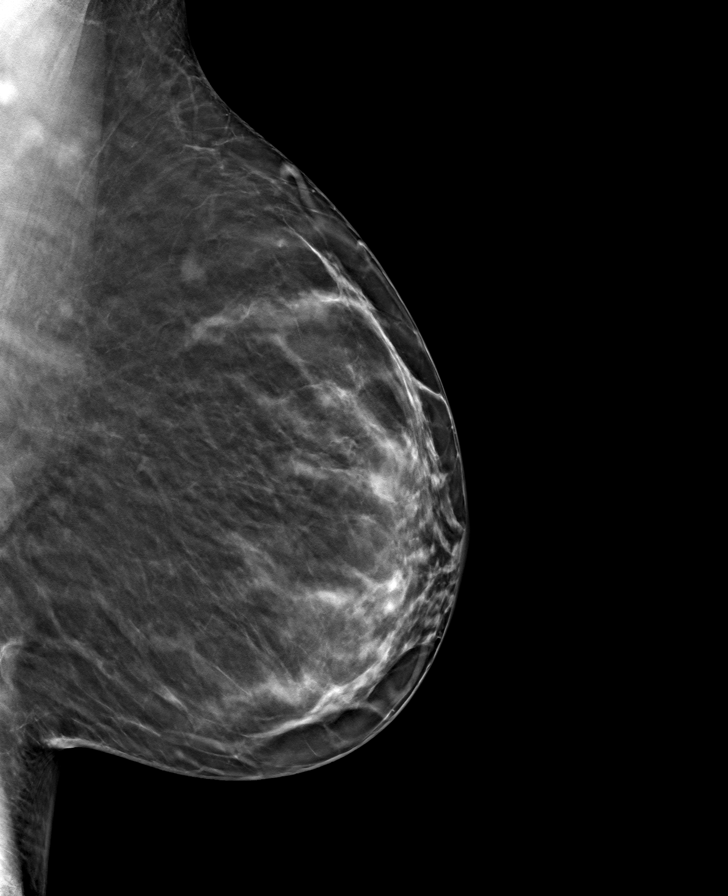

[R CC tomo · tomo slice 35/69.0]
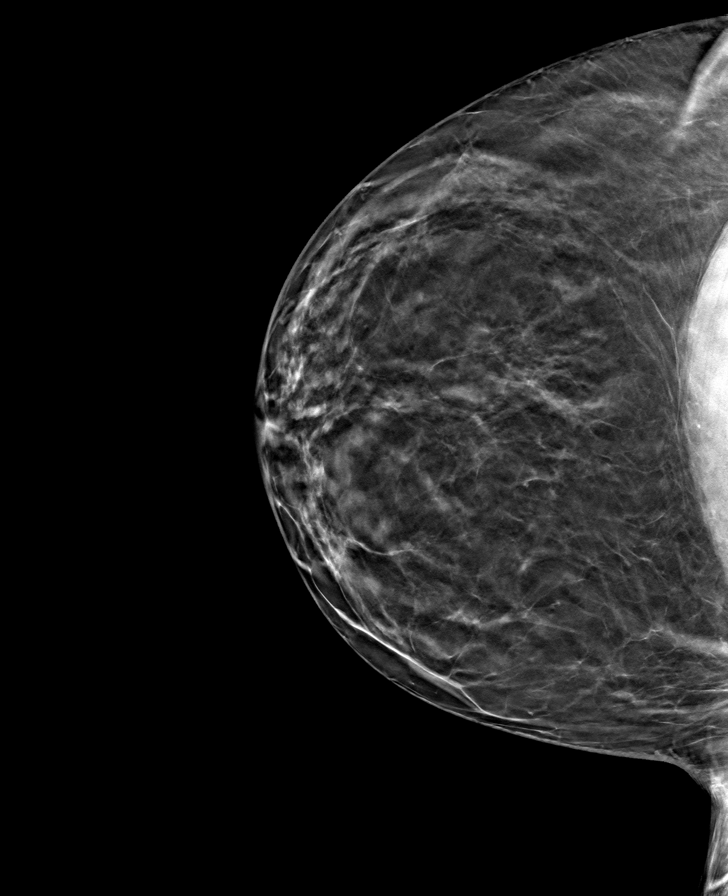

[8 of 24 positions shown; findings below may reference images not displayed]

ACR Breast Density Category b: There are scattered areas of
fibroglandular density.
FINDINGS: There are no findings suspicious for malignancy. Images were
processed with CAD.
IMPRESSION: No mammographic evidence of malignancy. A result letter of this
screening mammogram will be mailed directly to the patient.

RECOMMENDATION:
Screening mammogram in one year. (Code:[id number])

BI-RADS CATEGORY  1: Negative.

## 2019-11-21 ENCOUNTER — Other Ambulatory Visit: Payer: Self-pay | Admitting: Internal Medicine

## 2019-11-21 DIAGNOSIS — Z1231 Encounter for screening mammogram for malignant neoplasm of breast: Secondary | ICD-10-CM

## 2019-11-28 ENCOUNTER — Ambulatory Visit
Admission: RE | Admit: 2019-11-28 | Discharge: 2019-11-28 | Disposition: A | Discharge status: Home / Self Care | Payer: 59 | Source: Ambulatory Visit | Attending: Internal Medicine | Admitting: Internal Medicine

## 2019-11-28 ENCOUNTER — Other Ambulatory Visit: Payer: Self-pay

## 2019-11-28 DIAGNOSIS — Z1231 Encounter for screening mammogram for malignant neoplasm of breast: Secondary | ICD-10-CM

## 2020-01-19 ENCOUNTER — Other Ambulatory Visit: Payer: Self-pay

## 2020-01-19 ENCOUNTER — Ambulatory Visit: Payer: 59 | Admitting: Skilled Nursing Facility1

## 2020-01-19 ENCOUNTER — Encounter: Payer: Self-pay | Admitting: Skilled Nursing Facility1

## 2020-01-19 ENCOUNTER — Encounter: Payer: 59 | Attending: Internal Medicine | Admitting: Skilled Nursing Facility1

## 2020-01-19 NOTE — Progress Notes (Signed)
Bariatric Nutrition Follow-Up Visit Medical Nutrition Therapy   Pt given star previously: no  NUTRITION ASSESSMENT    Anthropometrics  Surgery date: 06/04/2017 Surgery type: Sleeve Start weight at [facility]: 336.7 Weight today: 258.6  Body Composition Scale 01/19/2020  Weight  lbs 258.6  Total Body Fat  % 43.6     Visceral Fat 14  Fat-Free Mass  % 56.3     Total Body Water  % 42.6     Muscle-Mass  lbs 35.1  BMI 38  Body Fat Displacement ---        Torso  lbs 69.9        Left Leg  lbs 13.9        Right Leg  lbs 13.9        Left Arm  lbs 6.9        Right Arm  lbs 6.9   Clinical  Medical hx: prediabetes Medications: see list Labs:    Lifestyle & Dietary Hx  Pt states the pandemic has made it tough to continue on her journey.  Pt states she has let her job become more important than herself which she knows was a struggle for her previous to having surgery.  Pt states she will set alarms to remind her to stop working and eat lunch. Pt states she struggles with feeling like she cannot tell her friends and family how she is really struggling due to feeling like she has to be every bodies rock.   Estimated daily fluid intake: 40-50 oz Estimated daily protein intake: 60+ g Supplements: multi + iron and calcium Current average weekly physical activity: gym   24-Hr Dietary Recall First Meal: egg + sausage or bacon + grits Snack: trailmix or cracker pack Second Meal: skipped Snack: peanut butter + apple Third Meal 6:30-7:30: chicken or seafood or beef + coleslaw or asparagus or spinach Snack:  Beverages: water, sometimes regular lemonade  Post-Op Goals/ Signs/ Symptoms Using straws: no Drinking while eating: no Chewing/swallowing difficulties: no Changes in vision: no Changes to mood/headaches: no Hair loss/changes to skin/nails: no Difficulty focusing/concentrating: no Sweating: no Dizziness/lightheadedness: no Palpitations: no  Carbonated/caffeinated beverages:  no N/V/D/C/Gas: no Abdominal pain: no Dumping syndrome: no    NUTRITION DIAGNOSIS  Overweight/obesity ([place]-3.3) related to past poor dietary habits and physical inactivity as evidenced by completed bariatric surgery and following dietary guidelines for continued weight loss and healthy nutrition status.     NUTRITION INTERVENTION Nutrition counseling (C-1) and education (E-2) to facilitate bariatric surgery goals, including: . The importance of consuming adequate calories as well as certain nutrients daily due to the body's need for essential vitamins, minerals, and fats . The importance of daily physical activity and to reach a goal of at least 150 minutes of moderate to vigorous physical activity weekly (or as directed by their physician) due to benefits such as increased musculature and improved lab values . The importance of intuitive eating specifically learning hunger-satiety cues and understanding the importance of learning a new body  Goals: Aim to drink a minimum 64 ounces fluid per day Reach back out to your therapist Accept your friends and families help Work on better work/life balance  Handouts Provided Include  Detailed MyPlate  Learning Style & Readiness for Change Teaching method utilized: Visual & Auditory  Demonstrated degree of understanding via: Teach Back  Barriers to learning/adherence to lifestyle change:lacking self care due to work schedule   RD's Notes for Next Visit . Assess adherence to pt chosen goals .  Work/;ife balance    MONITORING & EVALUATION Dietary intake, weekly physical activity, body weight Next Steps Patient is to follow-up in 2 months

## 2020-01-20 ENCOUNTER — Telehealth: Payer: 59 | Admitting: Adult Health

## 2020-03-15 ENCOUNTER — Encounter: Payer: 59 | Attending: Internal Medicine | Admitting: Skilled Nursing Facility1

## 2020-03-15 ENCOUNTER — Other Ambulatory Visit: Payer: Self-pay

## 2020-03-15 NOTE — Progress Notes (Signed)
Bariatric Nutrition Follow-Up Visit Medical Nutrition Therapy   Pt given star previously: no  NUTRITION ASSESSMENT    Anthropometrics  Surgery date: 06/04/2017 Surgery type: Sleeve Start weight at [facility]: 336.7 Weight today: pt declined  Body Composition Scale 01/19/2020  Weight  lbs 258.6  Total Body Fat  % 43.6     Visceral Fat 14  Fat-Free Mass  % 56.3     Total Body Water  % 42.6     Muscle-Mass  lbs 35.1  BMI 38  Body Fat Displacement ---        Torso  lbs 69.9        Left Leg  lbs 13.9        Right Leg  lbs 13.9        Left Arm  lbs 6.9        Right Arm  lbs 6.9   Clinical  Medical hx: prediabetes Medications: see list Labs:    Lifestyle & Dietary Hx  Pt states the pandemic has made it tough to continue on her journey.  Pt states she has let her job become more important than herself which she knows was a struggle for her previous to having surgery.  Pt states she will set alarms to remind her to stop working and eat lunch. Pt states she struggles with feeling like she cannot tell her friends and family how she is really struggling due to feeling like she has to be every bodies rock.   Pt states she is taking topirimate/pherntermine for weight loss.  Pt states she waits too long to eat.   Estimated daily fluid intake: 40-50 oz Estimated daily protein intake: 60+ g Supplements: multi + iron and inconsistent with calcium Current average weekly physical activity: ADL's  24-Hr Dietary Recall First Meal: egg + sausage or bacon + grits or skipped or fast food Snack: trailmix or cracker pack Second Meal: skipped Snack: peanut butter + apple Third Meal 6:30-7:30: chicken or seafood or beef + coleslaw or asparagus or spinach Snack:  Beverages: water, sometimes regular lemonade, fruit smoothies  Post-Op Goals/ Signs/ Symptoms Using straws: no Drinking while eating: no Chewing/swallowing difficulties: no Changes in vision: no Changes to mood/headaches: no Hair  loss/changes to skin/nails: no Difficulty focusing/concentrating: no Sweating: no Dizziness/lightheadedness: no Palpitations: no  Carbonated/caffeinated beverages: no N/V/D/C/Gas: bowel movement every other day Abdominal pain: no Dumping syndrome: no    NUTRITION DIAGNOSIS  Overweight/obesity ([place]-3.3) related to past poor dietary habits and physical inactivity as evidenced by completed bariatric surgery and following dietary guidelines for continued weight loss and healthy nutrition status.     NUTRITION INTERVENTION Nutrition counseling (C-1) and education (E-2) to facilitate bariatric surgery goals, including: . The importance of consuming adequate calories as well as certain nutrients daily due to the body's need for essential vitamins, minerals, and fats . The importance of daily physical activity and to reach a goal of at least 150 minutes of moderate to vigorous physical activity weekly (or as directed by their physician) due to benefits such as increased musculature and improved lab values . The importance of intuitive eating specifically learning hunger-satiety cues and understanding the importance of learning a new body  Goals: Aim to drink a minimum 80 ounces fluid per day Aim to eat non starchy vegetables 2 times a day 7 days a week Use diabetes food hub for recipes   Handouts Provided Include  Detailed MyPlate  Learning Style & Readiness for Change Teaching method utilized: Visual &  Auditory  Demonstrated degree of understanding via: Teach Back  Barriers to learning/adherence to lifestyle change:lacking self care due to work schedule   RD's Notes for Next Visit . Assess adherence to pt chosen goals . Work Geophysical data processor    MONITORING & EVALUATION Dietary intake, weekly physical activity, body weight Next Steps Patient is to follow-up in 2 months

## 2020-05-06 ENCOUNTER — Other Ambulatory Visit: Payer: Self-pay

## 2020-05-06 ENCOUNTER — Encounter (INDEPENDENT_AMBULATORY_CARE_PROVIDER_SITE_OTHER): Payer: Self-pay | Admitting: Family Medicine

## 2020-05-06 ENCOUNTER — Ambulatory Visit (INDEPENDENT_AMBULATORY_CARE_PROVIDER_SITE_OTHER): Payer: 59 | Admitting: Family Medicine

## 2020-05-06 VITALS — BP 128/82 | HR 64 | Temp 97.9°F | Ht 68.0 in | Wt 254.0 lb

## 2020-05-06 DIAGNOSIS — E66812 Obesity, class 2: Secondary | ICD-10-CM

## 2020-05-06 DIAGNOSIS — E559 Vitamin D deficiency, unspecified: Secondary | ICD-10-CM

## 2020-05-06 DIAGNOSIS — Z9189 Other specified personal risk factors, not elsewhere classified: Secondary | ICD-10-CM | POA: Diagnosis not present

## 2020-05-06 DIAGNOSIS — Z1331 Encounter for screening for depression: Secondary | ICD-10-CM | POA: Diagnosis not present

## 2020-05-06 DIAGNOSIS — R5383 Other fatigue: Secondary | ICD-10-CM

## 2020-05-06 DIAGNOSIS — R739 Hyperglycemia, unspecified: Secondary | ICD-10-CM

## 2020-05-06 DIAGNOSIS — Z9884 Bariatric surgery status: Secondary | ICD-10-CM

## 2020-05-06 DIAGNOSIS — I10 Essential (primary) hypertension: Secondary | ICD-10-CM | POA: Diagnosis not present

## 2020-05-06 DIAGNOSIS — R0602 Shortness of breath: Secondary | ICD-10-CM

## 2020-05-06 DIAGNOSIS — Z6838 Body mass index (BMI) 38.0-38.9, adult: Secondary | ICD-10-CM

## 2020-05-06 DIAGNOSIS — Z0289 Encounter for other administrative examinations: Secondary | ICD-10-CM

## 2020-05-07 LAB — CBC WITH DIFFERENTIAL/PLATELET
Basophils Absolute: 0 10*3/uL (ref 0.0–0.2)
Basos: 1 %
EOS (ABSOLUTE): 0.1 10*3/uL (ref 0.0–0.4)
Eos: 2 %
Hematocrit: 42.5 % (ref 34.0–46.6)
Hemoglobin: 13.6 g/dL (ref 11.1–15.9)
Immature Grans (Abs): 0 10*3/uL (ref 0.0–0.1)
Immature Granulocytes: 0 %
Lymphocytes Absolute: 1.8 10*3/uL (ref 0.7–3.1)
Lymphs: 38 %
MCH: 26.9 pg (ref 26.6–33.0)
MCHC: 32 g/dL (ref 31.5–35.7)
MCV: 84 fL (ref 79–97)
Monocytes Absolute: 0.3 10*3/uL (ref 0.1–0.9)
Monocytes: 7 %
Neutrophils Absolute: 2.5 10*3/uL (ref 1.4–7.0)
Neutrophils: 52 %
Platelets: 209 10*3/uL (ref 150–450)
RBC: 5.05 x10E6/uL (ref 3.77–5.28)
RDW: 12.7 % (ref 11.7–15.4)
WBC: 4.8 10*3/uL (ref 3.4–10.8)

## 2020-05-07 LAB — COMPREHENSIVE METABOLIC PANEL
ALT: 14 IU/L (ref 0–32)
AST: 23 IU/L (ref 0–40)
Albumin/Globulin Ratio: 1.5 (ref 1.2–2.2)
Albumin: 4.5 g/dL (ref 3.8–4.8)
Alkaline Phosphatase: 117 IU/L (ref 44–121)
BUN/Creatinine Ratio: 17 (ref 9–23)
BUN: 19 mg/dL (ref 6–24)
Bilirubin Total: 0.6 mg/dL (ref 0.0–1.2)
CO2: 29 mmol/L (ref 20–29)
Calcium: 10 mg/dL (ref 8.7–10.2)
Chloride: 101 mmol/L (ref 96–106)
Creatinine, Ser: 1.1 mg/dL — ABNORMAL HIGH (ref 0.57–1.00)
GFR calc Af Amer: 68 mL/min/{1.73_m2} (ref >59)
GFR calc non Af Amer: 59 mL/min/{1.73_m2} — ABNORMAL LOW (ref >59)
Globulin, Total: 3 g/dL (ref 1.5–4.5)
Glucose: 86 mg/dL (ref 65–99)
Potassium: 4.8 mmol/L (ref 3.5–5.2)
Sodium: 143 mmol/L (ref 134–144)
Total Protein: 7.5 g/dL (ref 6.0–8.5)

## 2020-05-07 LAB — T4: T4, Total: 7.1 ug/dL (ref 4.5–12.0)

## 2020-05-07 LAB — VITAMIN D 25 HYDROXY (VIT D DEFICIENCY, FRACTURES): Vit D, 25-Hydroxy: 115 ng/mL — ABNORMAL HIGH (ref 30.0–100.0)

## 2020-05-07 LAB — INSULIN, RANDOM: INSULIN: 9.1 u[IU]/mL (ref 2.6–24.9)

## 2020-05-07 LAB — VITAMIN B12: Vitamin B-12: >2000 pg/mL — ABNORMAL HIGH (ref 232–1245)

## 2020-05-07 LAB — LIPID PANEL WITH LDL/HDL RATIO
Cholesterol, Total: 205 mg/dL — ABNORMAL HIGH (ref 100–199)
HDL: 67 mg/dL (ref >39)
LDL Chol Calc (NIH): 131 mg/dL — ABNORMAL HIGH (ref 0–99)
LDL/HDL Ratio: 2 ratio (ref 0.0–3.2)
Triglycerides: 37 mg/dL (ref 0–149)
VLDL Cholesterol Cal: 7 mg/dL (ref 5–40)

## 2020-05-07 LAB — FOLATE: Folate: >20 ng/mL (ref >3.0)

## 2020-05-07 LAB — HEMOGLOBIN A1C
Est. average glucose Bld gHb Est-mCnc: 114 mg/dL
Hgb A1c MFr Bld: 5.6 % (ref 4.8–5.6)

## 2020-05-07 LAB — TSH: TSH: 1.57 u[IU]/mL (ref 0.450–4.500)

## 2020-05-07 LAB — T3: T3, Total: 104 ng/dL (ref 71–180)

## 2020-05-11 ENCOUNTER — Encounter (INDEPENDENT_AMBULATORY_CARE_PROVIDER_SITE_OTHER): Payer: Self-pay | Admitting: Family Medicine

## 2020-05-11 NOTE — Progress Notes (Signed)
Chief Complaint:   OBESITY Leslie Mason (MR# [id number]) is a 49 y.o. female who presents for evaluation and treatment of obesity and related comorbidities. Current BMI is Body mass index is 38.62 kg/m. Leslie Mason has been struggling with her weight for many years and has been unsuccessful in either losing weight, maintaining weight loss, or reaching her healthy weight goal.  Leslie Mason is status post weight loss surgery, gastric sleeve 2019 by Dr. Andrey Campanile. She has gone from 340 lbs to 220 lbs in 6 months, and she is up 40 lbs. She has been seeing dieticians recently. She has been on phentermine for 2-3 months by Dr. Andrey Campanile.  Leslie Mason is currently in the action stage of change and ready to dedicate time achieving and maintaining a healthier weight. Leslie Mason is interested in becoming our patient and working on intensive lifestyle modifications including (but not limited to) diet and exercise for weight loss.  Leslie Mason's habits were reviewed today and are as follows: Her family eats meals together, her desired weight loss is 69 lbs, she started gaining weight after she broke her ankle the first time, her heaviest weight ever was 340 pounds, she is a picky eater and doesn't like to eat healthier foods, she has significant food cravings issues, she snacks frequently in the evenings, she skips meals frequently, she is frequently drinking liquids with calories, she frequently makes poor food choices and she struggles with emotional eating.  Depression Screen Leslie Mason's Food and Mood (modified PHQ-9) score was 10.  Depression screen PHQ 2/9 05/06/2020  Decreased Interest 2  Down, Depressed, Hopeless 1  PHQ - 2 Score 3  Altered sleeping 1  Tired, decreased energy 2  Change in appetite 0  Feeling bad or failure about yourself  1  Trouble concentrating 2  Moving slowly or fidgety/restless 0  Suicidal thoughts 0  PHQ-9 Score 9   Subjective:    1. Other fatigue [name] admits to daytime  somnolence and admits to waking up still tired. Patent has a history of symptoms of daytime fatigue. Leslie Mason generally gets 6 hours of sleep per night, and states that she has difficulty falling asleep. Snoring is present. Apneic episodes are not present. Epworth Sleepiness Score is 1.  2. Shortness of breath on exertion Leslie Mason notes increasing shortness of breath with exercising and seems to be worsening over time with weight gain. She notes getting out of breath sooner with activity than she used to. This has not gotten worse recently. Leslie Mason denies shortness of breath at rest or orthopnea.  3. Essential hypertension Leslie Mason's blood pressure is well controlled on her medications, and she is working on weight loss.  4. Vitamin D deficiency Leslie Mason is status post weight loss surgery. She has a history of Vit D deficiency, and she on Vit D now.  5. Hyperglycemia Leslie Mason has a history of elevated glucose and A1c readings. She is working on weight loss.  6. At risk for heart disease Leslie Mason is at a higher than average risk for cardiovascular disease due to obesity.   Assessment/Plan:   1. Other fatigue Leslie Mason does feel that her weight is causing her energy to be lower than it should be. Fatigue may be related to obesity, depression or many other causes. Labs will be ordered, and in the meanwhile, Leslie Mason will focus on self care including making healthy food choices, increasing physical activity and focusing on stress reduction.  - Vitamin B12 - CBC with Differential/Platelet - T3 - T4 - TSH  2.  Shortness of breath on exertion Leslie Mason does feel that she gets out of breath more easily that she used to when she exercises. Leslie Mason's shortness of breath appears to be obesity related and exercise induced. She has agreed to work on weight loss and gradually increase exercise to treat her exercise induced shortness of breath. Will continue to monitor closely.  - EKG 12-Lead  3. Essential  hypertension Leslie Mason will start her Category 2 plan, and will continue working on healthy weight loss and exercise to improve blood pressure control. We will watch for signs of hypotension as she continues her lifestyle modifications.  4. Vitamin D deficiency Low Vitamin D level contributes to fatigue and are associated with obesity, breast, and colon cancer. We will check labs today. Leslie Mason will follow-up for routine testing of Vitamin D, at least 2-3 times per year to avoid over-replacement.  - Vitamin B12 - Folate - VITAMIN D 25 Hydroxy (Vit-D Deficiency, Fractures)  5. Hyperglycemia Fasting labs will be obtained and results with be discussed with Leslie Mason in 2 weeks at her follow up visit. In the meanwhile Leslie Mason will start her Category 2 plan and will work on weight loss efforts.  - Comprehensive metabolic panel - Hemoglobin A1c - Insulin, random - Lipid Panel With LDL/HDL Ratio  6. Depression screening Leslie Mason had a positive depression screening. Depression is commonly associated with obesity and often results in emotional eating behaviors. We will monitor this closely and work on CBT to help improve the non-hunger eating patterns. Referral to Psychology may be required if no improvement is seen as she continues in our clinic.  7. At risk for heart disease Leslie Mason was given approximately 30 minutes of coronary artery disease prevention counseling today. She is 49 y.o. female and has risk factors for heart disease including obesity. We discussed intensive lifestyle modifications today with an emphasis on specific weight loss instructions and strategies.   Repetitive spaced learning was employed today to elicit superior memory formation and behavioral change.  8. Class 2 severe obesity with serious comorbidity and body mass index (BMI) of 38.0 to 38.9 in adult, unspecified obesity type (HCC) Leslie Mason is currently in the action stage of change and her goal is to continue with weight  loss efforts. I recommend Leslie Mason begin the structured treatment plan as follows:  Leslie Mason is to hold phentermine for now, as she is likely not eating enough already.  She has agreed to the Category 2 Plan.  Exercise goals: No exercise has been prescribed for now, while we concentrate on nutritional changes.  Behavioral modification strategies: no skipping meals and meal planning and cooking strategies.  She was informed of the importance of frequent follow-up visits to maximize her success with intensive lifestyle modifications for her multiple health conditions. She was informed we would discuss her lab results at her next visit unless there is a critical issue that needs to be addressed sooner. Biana agreed to keep her next visit at the agreed upon time to discuss these results.  Objective:   Blood pressure 128/82, pulse 64, temperature 97.9 F (36.6 C), height 5\' 8"  (1.727 m), weight 254 lb (115.2 kg), SpO2 99 %. Body mass index is 38.62 kg/m.  EKG: Normal sinus rhythm, rate 68 BPM.  Indirect Calorimeter completed today shows a VO2 of 313 and a REE of 2179.  Her calculated basal metabolic rate is 2180 thus her basal metabolic rate is better than expected.  General: Cooperative, alert, well developed, in no acute distress. HEENT: Conjunctivae and  lids unremarkable. Cardiovascular: Regular rhythm.  Lungs: Normal work of breathing. Neurologic: No focal deficits.   Lab Results  Component Value Date   CREATININE 1.10 (H) 05/06/2020   BUN 19 05/06/2020   NA 143 05/06/2020   K 4.8 05/06/2020   CL 101 05/06/2020   CO2 29 05/06/2020   Lab Results  Component Value Date   ALT 14 05/06/2020   AST 23 05/06/2020   ALKPHOS 117 05/06/2020   BILITOT 0.6 05/06/2020   Lab Results  Component Value Date   HGBA1C 5.6 05/06/2020   HGBA1C 5.7 (H) 06/01/2017   Lab Results  Component Value Date   INSULIN 9.1 05/06/2020   Lab Results  Component Value Date   TSH 1.570 05/06/2020    Lab Results  Component Value Date   CHOL 205 (H) 05/06/2020   HDL 67 05/06/2020   LDLCALC 131 (H) 05/06/2020   TRIG 37 05/06/2020   Lab Results  Component Value Date   WBC 4.8 05/06/2020   HGB 13.6 05/06/2020   HCT 42.5 05/06/2020   MCV 84 05/06/2020   PLT 209 05/06/2020   No results found for: IRON, TIBC, FERRITIN  Attestation Statements:   Reviewed by clinician on day of visit: allergies, medications, problem list, medical history, surgical history, family history, social history, and previous encounter notes.   I, Burt Knack, am acting as transcriptionist for Quillian Quince, MD.  I have reviewed the above documentation for accuracy and completeness, and I agree with the above. - Quillian Quince, MD

## 2020-05-18 ENCOUNTER — Ambulatory Visit (INDEPENDENT_AMBULATORY_CARE_PROVIDER_SITE_OTHER): Payer: 59 | Admitting: Family Medicine

## 2020-05-18 ENCOUNTER — Other Ambulatory Visit: Payer: Self-pay

## 2020-05-18 ENCOUNTER — Encounter (INDEPENDENT_AMBULATORY_CARE_PROVIDER_SITE_OTHER): Payer: Self-pay | Admitting: Family Medicine

## 2020-05-18 VITALS — BP 145/85 | HR 63 | Temp 98.1°F | Ht 68.0 in | Wt 252.0 lb

## 2020-05-18 DIAGNOSIS — I10 Essential (primary) hypertension: Secondary | ICD-10-CM

## 2020-05-18 DIAGNOSIS — E559 Vitamin D deficiency, unspecified: Secondary | ICD-10-CM | POA: Diagnosis not present

## 2020-05-18 DIAGNOSIS — Z9189 Other specified personal risk factors, not elsewhere classified: Secondary | ICD-10-CM

## 2020-05-18 DIAGNOSIS — E8881 Metabolic syndrome: Secondary | ICD-10-CM | POA: Diagnosis not present

## 2020-05-18 DIAGNOSIS — Z6838 Body mass index (BMI) 38.0-38.9, adult: Secondary | ICD-10-CM

## 2020-05-19 NOTE — Progress Notes (Signed)
Chief Complaint:   OBESITY Leslie Mason is here to discuss her progress with her obesity treatment plan along with follow-up of her obesity related diagnoses. Leslie Mason is on the Category 2 Plan and states she is following her eating plan approximately 98% of the time. Leslie Mason states she is doing 0 minutes 0 times per week.  Today's visit was #: 2 Starting weight: 254 lbs Starting date: 05/06/2020 Today's weight: 252 lbs Today's date: 05/18/2020 Total lbs lost to date: 2 Total lbs lost since last in-office visit: 2  Interim History: Leslie Mason has done well with weight loss on her Category 2 plan. She sometimes struggled to eat all of the food on the plan. She stopped her phentermine and she still had no issues with hunger.  Subjective:   1. Vitamin D deficiency Leslie Mason is on bariatric multivitamins plus OTC Vit D, and her levels are now over-replaced. She denies nausea, vomiting, or muscle weakness yet. I discussed labs with the patient today.  2. Insulin resistance Leslie Mason has a new diagnosis of insulin resistance. She has mild elevation in fasting insulin which is likely contributing to her weight loss.  3. Essential hypertension Leslie Mason's blood pressure is elevated today. She has a history of mildly elevated creatinine as well. She denies chest pain. I discussed labs with the patient today.  4. At risk for diabetes mellitus Leslie Mason is at higher than average risk for developing diabetes due to obesity.   Assessment/Plan:   1. Vitamin D deficiency Low Vitamin D level contributes to fatigue and are associated with obesity, breast, and colon cancer. Leslie Mason agreed to discontinue bariatric vitamins and extra Vit D. We will plan to recheck labs in 3 months. We will reassess how much of her vitamins she needs at that point. She will follow-up for routine testing of Vitamin D, at least 2-3 times per year to avoid over-replacement.  2. Insulin resistance Leslie Mason deferred metformin, and she  will continue to work on weight loss, diet, exercise, and decreasing simple carbohydrates to help decrease the risk of diabetes. We will recheck labs in 3 months. Leslie Mason agreed to follow-up with [place] as directed to closely monitor her progress.  3. Essential hypertension Leslie Mason is working on healthy weight loss and exercise to improve blood pressure control. We will watch for signs of hypotension as she continues her lifestyle modifications. Leslie Mason was educated on the dangers of elevated blood pressure, and decreased renal function. We will recheck her blood pressure in 2 weeks, if it is still elevated then we may need to adjust her medications until she loses enough weight to decrease her blood pressure.  4. At risk for diabetes mellitus Leslie Mason was given approximately 30 minutes of diabetes education and counseling today. We discussed intensive lifestyle modifications today with an emphasis on weight loss as well as increasing exercise and decreasing simple carbohydrates in her diet. We also reviewed medication options with an emphasis on risk versus benefit of those discussed.   Repetitive spaced learning was employed today to elicit superior memory formation and behavioral change.  5. Class 2 severe obesity with serious comorbidity and body mass index (BMI) of 38.0 to 38.9 in adult, unspecified obesity type (HCC) Leslie Mason is currently in the action stage of change. As such, her goal is to continue with weight loss efforts. She has agreed to the Category 2 Plan and keeping a food journal and adhering to recommended goals of 400-550 calories and 40+ grams of protein at supper daily.   Behavioral  modification strategies: increasing lean protein intake and holiday eating strategies .  Leslie Mason has agreed to follow-up with our clinic in 2 weeks. She was informed of the importance of frequent follow-up visits to maximize her success with intensive lifestyle modifications for her multiple health  conditions.   Objective:   Blood pressure (!) 145/85, pulse 63, temperature 98.1 F (36.7 C), height 5\' 8"  (1.727 m), weight 252 lb (114.3 kg), SpO2 96 %. Body mass index is 38.32 kg/m.  General: Cooperative, alert, well developed, in no acute distress. HEENT: Conjunctivae and lids unremarkable. Cardiovascular: Regular rhythm.  Lungs: Normal work of breathing. Neurologic: No focal deficits.   Lab Results  Component Value Date   CREATININE 1.10 (H) 05/06/2020   BUN 19 05/06/2020   NA 143 05/06/2020   K 4.8 05/06/2020   CL 101 05/06/2020   CO2 29 05/06/2020   Lab Results  Component Value Date   ALT 14 05/06/2020   AST 23 05/06/2020   ALKPHOS 117 05/06/2020   BILITOT 0.6 05/06/2020   Lab Results  Component Value Date   HGBA1C 5.6 05/06/2020   HGBA1C 5.7 (H) 06/01/2017   Lab Results  Component Value Date   INSULIN 9.1 05/06/2020   Lab Results  Component Value Date   TSH 1.570 05/06/2020   Lab Results  Component Value Date   CHOL 205 (H) 05/06/2020   HDL 67 05/06/2020   LDLCALC 131 (H) 05/06/2020   TRIG 37 05/06/2020   Lab Results  Component Value Date   WBC 4.8 05/06/2020   HGB 13.6 05/06/2020   HCT 42.5 05/06/2020   MCV 84 05/06/2020   PLT 209 05/06/2020   No results found for: IRON, TIBC, FERRITIN  Attestation Statements:   Reviewed by clinician on day of visit: allergies, medications, problem list, medical history, surgical history, family history, social history, and previous encounter notes.   I, 13/10/2019, am acting as transcriptionist for Burt Knack, MD.  I have reviewed the above documentation for accuracy and completeness, and I agree with the above. -  Quillian Quince, MD

## 2020-05-20 ENCOUNTER — Ambulatory Visit (INDEPENDENT_AMBULATORY_CARE_PROVIDER_SITE_OTHER): Payer: 59 | Admitting: Family Medicine

## 2020-05-31 ENCOUNTER — Encounter (INDEPENDENT_AMBULATORY_CARE_PROVIDER_SITE_OTHER): Payer: Self-pay | Admitting: Family Medicine

## 2020-05-31 ENCOUNTER — Ambulatory Visit (INDEPENDENT_AMBULATORY_CARE_PROVIDER_SITE_OTHER): Payer: 59 | Admitting: Family Medicine

## 2020-05-31 ENCOUNTER — Other Ambulatory Visit: Payer: Self-pay

## 2020-05-31 VITALS — HR 60 | Temp 98.3°F | Ht 68.0 in | Wt 255.0 lb

## 2020-05-31 DIAGNOSIS — Z9189 Other specified personal risk factors, not elsewhere classified: Secondary | ICD-10-CM | POA: Diagnosis not present

## 2020-05-31 DIAGNOSIS — I10 Essential (primary) hypertension: Secondary | ICD-10-CM | POA: Diagnosis not present

## 2020-05-31 DIAGNOSIS — Z6838 Body mass index (BMI) 38.0-38.9, adult: Secondary | ICD-10-CM

## 2020-05-31 MED ORDER — CHLORTHALIDONE 25 MG PO TABS: ORAL_TABLET | ORAL | 0 refills | Status: DC

## 2020-06-07 NOTE — Progress Notes (Signed)
Chief Complaint:   OBESITY Leslie Mason is here to discuss her progress with her obesity treatment plan along with follow-up of her obesity related diagnoses. Leslie Mason is on the Category 2 Plan and keeping a food journal and adhering to recommended goals of 400-550 calories and 40+ grams of protein at supper daily and states she is following her eating plan approximately 98% of the time. Leslie Mason states she is at the gym for 90 minutes 1 time per week.  Today's visit was #: 3 Starting weight: 254 lbs Starting date: 05/06/2020 Today's weight: 255 lbs Today's date: 05/31/2020 Total lbs lost to date: 0 Total lbs lost since last in-office visit: 0  Interim History: Leslie Mason is retaining some water weight today, and she has had more simple carbohydrates and sodium over the weekend. She is working on not skipping meals, but she still struggles to eat all of her protein especially at night.  Subjective:   1. Essential hypertension Leslie Mason is metoprolol but her blood pressure has been borderline elevated, and she is retaining some fluid.  2. At risk for heart disease Leslie Mason is at a higher than average risk for cardiovascular disease due to obesity.   Assessment/Plan:   1. Essential hypertension Leslie Mason is working on healthy weight loss and exercise to improve blood pressure control. We will watch for signs of hypotension as she continues her lifestyle modifications. Leslie Mason agreed to start chlorthalidone 12.5 mg q daily with no refills.  - chlorthalidone (HYGROTON) 25 MG tablet; Take 1/2 tablet (12.5mg  total) by mouth once daily.  Dispense: 15 tablet; Refill: 0  2. At risk for heart disease Leslie Mason was given approximately 15 minutes of coronary artery disease prevention counseling today. She is 49 y.o. female and has risk factors for heart disease including obesity. We discussed intensive lifestyle modifications today with an emphasis on specific weight loss instructions and strategies.    Repetitive spaced learning was employed today to elicit superior memory formation and behavioral change.  3. Class 2 severe obesity with serious comorbidity and body mass index (BMI) of 38.0 to 38.9 in adult, unspecified obesity type (HCC) Leslie Mason is currently in the action stage of change. As such, her goal is to continue with weight loss efforts. She has agreed to the Category 2 Plan and keeping a food journal and adhering to recommended goals of 400-550 calories and 40+ grams of protein at supper daily.   Exercise goals: As is.  Behavioral modification strategies: meal planning and cooking strategies.  Leslie Mason has agreed to follow-up with our clinic in 2 to 3 weeks. She was informed of the importance of frequent follow-up visits to maximize her success with intensive lifestyle modifications for her multiple health conditions.   Objective:   Pulse 60, temperature 98.3 F (36.8 C), height 5\' 8"  (1.727 m), weight 255 lb (115.7 kg), SpO2 99 %. Body mass index is 38.77 kg/m.  General: Cooperative, alert, well developed, in no acute distress. HEENT: Conjunctivae and lids unremarkable. Cardiovascular: Regular rhythm.  Lungs: Normal work of breathing. Neurologic: No focal deficits.   Lab Results  Component Value Date   CREATININE 1.10 (H) 05/06/2020   BUN 19 05/06/2020   NA 143 05/06/2020   K 4.8 05/06/2020   CL 101 05/06/2020   CO2 29 05/06/2020   Lab Results  Component Value Date   ALT 14 05/06/2020   AST 23 05/06/2020   ALKPHOS 117 05/06/2020   BILITOT 0.6 05/06/2020   Lab Results  Component Value Date  HGBA1C 5.6 05/06/2020   HGBA1C 5.7 (H) 06/01/2017   Lab Results  Component Value Date   INSULIN 9.1 05/06/2020   Lab Results  Component Value Date   TSH 1.570 05/06/2020   Lab Results  Component Value Date   CHOL 205 (H) 05/06/2020   HDL 67 05/06/2020   LDLCALC 131 (H) 05/06/2020   TRIG 37 05/06/2020   Lab Results  Component Value Date   WBC 4.8  05/06/2020   HGB 13.6 05/06/2020   HCT 42.5 05/06/2020   MCV 84 05/06/2020   PLT 209 05/06/2020   No results found for: IRON, TIBC, FERRITIN  Attestation Statements:   Reviewed by clinician on day of visit: allergies, medications, problem list, medical history, surgical history, family history, social history, and previous encounter notes.   I, Burt Knack, am acting as transcriptionist for Quillian Quince, MD.  I have reviewed the above documentation for accuracy and completeness, and I agree with the above. -  Quillian Quince, MD

## 2020-06-15 ENCOUNTER — Ambulatory Visit (INDEPENDENT_AMBULATORY_CARE_PROVIDER_SITE_OTHER): Payer: 59 | Admitting: Family Medicine

## 2020-06-15 ENCOUNTER — Other Ambulatory Visit: Payer: Self-pay

## 2020-06-15 ENCOUNTER — Encounter (INDEPENDENT_AMBULATORY_CARE_PROVIDER_SITE_OTHER): Payer: Self-pay | Admitting: Family Medicine

## 2020-06-15 VITALS — BP 123/80 | HR 63 | Temp 97.7°F | Ht 68.0 in | Wt 250.0 lb

## 2020-06-15 DIAGNOSIS — Z6838 Body mass index (BMI) 38.0-38.9, adult: Secondary | ICD-10-CM

## 2020-06-15 DIAGNOSIS — Z9189 Other specified personal risk factors, not elsewhere classified: Secondary | ICD-10-CM | POA: Diagnosis not present

## 2020-06-15 DIAGNOSIS — I10 Essential (primary) hypertension: Secondary | ICD-10-CM | POA: Diagnosis not present

## 2020-06-15 MED ORDER — CHLORTHALIDONE 25 MG PO TABS: ORAL_TABLET | ORAL | 0 refills | Status: DC

## 2020-06-15 NOTE — Progress Notes (Signed)
Chief Complaint:   OBESITY Leslie Mason is here to discuss her progress with her obesity treatment plan along with follow-up of her obesity related diagnoses. Leslie Mason is on the Category 2 Plan and keeping a food journal and adhering to recommended goals of 400-500 calories and 40+ grams of protein at supper daily and states she is following her eating plan approximately 60% of the time. Leslie Mason states she is at the gym, walking, and doing housework for 40 minutes 7 times per week.  Today's visit was #: 4 Starting weight: 254 lbs Starting date: 05/06/2020 Today's weight: 250 lbs Today's date: 06/15/2020 Total lbs lost to date: 4 Total lbs lost since last in-office visit: 5  Interim History: Leslie Mason has done well with weight loss since her last visit. She has started to deviate from her plan more, but she is still trying to make healthier choices.  Subjective:   1. Essential hypertension Leslie Mason's blood pressure is well controlled on her medications. She denies signs of dehydration, and no lightheadedness or dizziness noted.  2. At risk for dehydration Leslie Mason is at risk for dehydration due to weight loss.  Assessment/Plan:   1. Essential hypertension Toy will continue to work on healthy weight loss and exercise to improve blood pressure control. We will watch for signs of hypotension as she continues her lifestyle modifications. We will refill chlorthalidone for 1 month.  - chlorthalidone (HYGROTON) 25 MG tablet; Take 1/2 tablet (12.5mg  total) by mouth once daily.  Dispense: 15 tablet; Refill: 0  2. At risk for dehydration Leslie Mason was given approximately 15 minutes dehydration prevention counseling today. Leslie Mason is at risk for dehydration due to weight loss and current medication(s). She was encouraged to hydrate and monitor fluid status to avoid dehydration as well as weight loss plateaus.   3. Class 2 severe obesity with serious comorbidity and body mass index (BMI) of 38.0  to 38.9 in adult, unspecified obesity type (HCC) Leslie Mason is currently in the action stage of change. As such, her goal is to continue with weight loss efforts. She has agreed to change to keeping a food journal and adhering to recommended goals of 1300-1600 calories and 85+ grams of protein daily.   Exercise goals: As is.  Behavioral modification strategies: meal planning and cooking strategies.  Leslie Mason has agreed to follow-up with our clinic in 3 to 4 weeks. She was informed of the importance of frequent follow-up visits to maximize her success with intensive lifestyle modifications for her multiple health conditions.   Objective:   Blood pressure 123/80, pulse 63, temperature 97.7 F (36.5 C), height 5\' 8"  (1.727 m), weight 250 lb (113.4 kg), SpO2 100 %. Body mass index is 38.01 kg/m.  General: Cooperative, alert, well developed, in no acute distress. HEENT: Conjunctivae and lids unremarkable. Cardiovascular: Regular rhythm.  Lungs: Normal work of breathing. Neurologic: No focal deficits.   Lab Results  Component Value Date   CREATININE 1.10 (H) 05/06/2020   BUN 19 05/06/2020   NA 143 05/06/2020   K 4.8 05/06/2020   CL 101 05/06/2020   CO2 29 05/06/2020   Lab Results  Component Value Date   ALT 14 05/06/2020   AST 23 05/06/2020   ALKPHOS 117 05/06/2020   BILITOT 0.6 05/06/2020   Lab Results  Component Value Date   HGBA1C 5.6 05/06/2020   HGBA1C 5.7 (H) 06/01/2017   Lab Results  Component Value Date   INSULIN 9.1 05/06/2020   Lab Results  Component Value Date  TSH 1.570 05/06/2020   Lab Results  Component Value Date   CHOL 205 (H) 05/06/2020   HDL 67 05/06/2020   LDLCALC 131 (H) 05/06/2020   TRIG 37 05/06/2020   Lab Results  Component Value Date   WBC 4.8 05/06/2020   HGB 13.6 05/06/2020   HCT 42.5 05/06/2020   MCV 84 05/06/2020   PLT 209 05/06/2020   No results found for: IRON, TIBC, FERRITIN  Attestation Statements:   Reviewed by clinician on  day of visit: allergies, medications, problem list, medical history, surgical history, family history, social history, and previous encounter notes.   I, Burt Knack, am acting as transcriptionist for Quillian Quince, MD.  I have reviewed the above documentation for accuracy and completeness, and I agree with the above. -  Quillian Quince, MD

## 2020-07-07 ENCOUNTER — Other Ambulatory Visit: Payer: Self-pay

## 2020-07-07 ENCOUNTER — Encounter (INDEPENDENT_AMBULATORY_CARE_PROVIDER_SITE_OTHER): Payer: Self-pay | Admitting: Family Medicine

## 2020-07-07 ENCOUNTER — Ambulatory Visit (INDEPENDENT_AMBULATORY_CARE_PROVIDER_SITE_OTHER): Payer: 59 | Admitting: Family Medicine

## 2020-07-07 VITALS — BP 126/87 | HR 61 | Temp 98.0°F | Ht 68.0 in | Wt 255.0 lb

## 2020-07-07 DIAGNOSIS — I10 Essential (primary) hypertension: Secondary | ICD-10-CM

## 2020-07-07 DIAGNOSIS — Z9189 Other specified personal risk factors, not elsewhere classified: Secondary | ICD-10-CM | POA: Diagnosis not present

## 2020-07-07 MED ORDER — CHLORTHALIDONE 25 MG PO TABS: 25.0000 mg | ORAL_TABLET | Freq: Every day | ORAL | 0 refills | Status: DC

## 2020-07-12 NOTE — Progress Notes (Signed)
Chief Complaint:   OBESITY Leslie Mason is here to discuss her progress with her obesity treatment plan along with follow-up of her obesity related diagnoses. Leslie Mason is on keeping a food journal and adhering to recommended goals of 1300-1500 calories and 85+ grams of protein daily and states she is following her eating plan approximately 85% of the time. Leslie Mason states she is walking and on the gym machines for 60 minutes 3 times per week.  Today's visit was #: 5 Starting weight: 254 lbs Starting date: 05/06/2020 Today's weight: 255 lbs Today's date: 07/07/2020 Total lbs lost to date: 0 Total lbs lost since last in-office visit: 0  Interim History: Leslie Mason did some celebration eating over the holidays. She is ready to get back on track, but she struggles to use the journaling app.  Subjective:   1. Essential hypertension Leslie Mason was on 1/2 dose of chlorthalidone but she accidentally took the full pill and did well. She ran out earlier however.  2. At risk for heart disease Leslie Mason is at a higher than average risk for cardiovascular disease due to obesity.   Assessment/Plan:   1. Essential hypertension Leslie Mason is working on healthy weight loss and exercise to improve blood pressure control. We will watch for signs of hypotension as she continues her lifestyle modifications. Leslie Mason agreed to increase chlorthalidone to 25 mg q daily with no refills, and will follow closely.  - chlorthalidone (HYGROTON) 25 MG tablet; Take 1 tablet (25 mg total) by mouth daily.  Dispense: 30 tablet; Refill: 0  2. At risk for heart disease Leslie Mason was given approximately 15 minutes of coronary artery disease prevention counseling today. She is 50 y.o. female and has risk factors for heart disease including obesity. We discussed intensive lifestyle modifications today with an emphasis on specific weight loss instructions and strategies.   Repetitive spaced learning was employed today to elicit superior  memory formation and behavioral change.  3. Class 2 severe obesity with serious comorbidity and body mass index (BMI) of 38.0 to 38.9 in adult, unspecified obesity type (HCC) Leslie Mason is currently in the action stage of change. As such, her goal is to continue with weight loss efforts. She has agreed to keeping a food journal and adhering to recommended goals of 1300-1500 calories and 85+ grams of protein daily.   Leslie Mason was shown some tricks on the journaling app which should help.  Exercise goals: As is.  Behavioral modification strategies: increasing lean protein intake.  Leslie Mason has agreed to follow-up with our clinic in 2 weeks. She was informed of the importance of frequent follow-up visits to maximize her success with intensive lifestyle modifications for her multiple health conditions.   Objective:   Blood pressure 126/87, pulse 61, temperature 98 F (36.7 C), height 5\' 8"  (1.727 m), weight 255 lb (115.7 kg), SpO2 98 %. Body mass index is 38.77 kg/m.  General: Cooperative, alert, well developed, in no acute distress. HEENT: Conjunctivae and lids unremarkable. Cardiovascular: Regular rhythm.  Lungs: Normal work of breathing. Neurologic: No focal deficits.   Lab Results  Component Value Date   CREATININE 1.10 (H) 05/06/2020   BUN 19 05/06/2020   NA 143 05/06/2020   K 4.8 05/06/2020   CL 101 05/06/2020   CO2 29 05/06/2020   Lab Results  Component Value Date   ALT 14 05/06/2020   AST 23 05/06/2020   ALKPHOS 117 05/06/2020   BILITOT 0.6 05/06/2020   Lab Results  Component Value Date   HGBA1C 5.6  05/06/2020   HGBA1C 5.7 (H) 06/01/2017   Lab Results  Component Value Date   INSULIN 9.1 05/06/2020   Lab Results  Component Value Date   TSH 1.570 05/06/2020   Lab Results  Component Value Date   CHOL 205 (H) 05/06/2020   HDL 67 05/06/2020   LDLCALC 131 (H) 05/06/2020   TRIG 37 05/06/2020   Lab Results  Component Value Date   WBC 4.8 05/06/2020   HGB 13.6  05/06/2020   HCT 42.5 05/06/2020   MCV 84 05/06/2020   PLT 209 05/06/2020   No results found for: IRON, TIBC, FERRITIN  Attestation Statements:   Reviewed by clinician on day of visit: allergies, medications, problem list, medical history, surgical history, family history, social history, and previous encounter notes.   I, Burt Knack, am acting as transcriptionist for Quillian Quince, MD.  I have reviewed the above documentation for accuracy and completeness, and I agree with the above. -  Quillian Quince, MD

## 2020-07-16 ENCOUNTER — Other Ambulatory Visit (INDEPENDENT_AMBULATORY_CARE_PROVIDER_SITE_OTHER): Payer: Self-pay | Admitting: Family Medicine

## 2020-07-16 DIAGNOSIS — I10 Essential (primary) hypertension: Secondary | ICD-10-CM

## 2020-07-21 ENCOUNTER — Encounter (INDEPENDENT_AMBULATORY_CARE_PROVIDER_SITE_OTHER): Payer: Self-pay | Admitting: Family Medicine

## 2020-07-21 ENCOUNTER — Telehealth (INDEPENDENT_AMBULATORY_CARE_PROVIDER_SITE_OTHER): Payer: 59 | Admitting: Family Medicine

## 2020-07-21 ENCOUNTER — Other Ambulatory Visit: Payer: Self-pay

## 2020-07-21 VITALS — BP 127/82 | HR 65 | Temp 98.0°F | Ht 68.0 in | Wt 255.0 lb

## 2020-07-21 DIAGNOSIS — Z9189 Other specified personal risk factors, not elsewhere classified: Secondary | ICD-10-CM | POA: Diagnosis not present

## 2020-07-21 DIAGNOSIS — Z6838 Body mass index (BMI) 38.0-38.9, adult: Secondary | ICD-10-CM

## 2020-07-21 DIAGNOSIS — M26609 Unspecified temporomandibular joint disorder, unspecified side: Secondary | ICD-10-CM | POA: Diagnosis not present

## 2020-07-21 MED ORDER — CYCLOBENZAPRINE HCL 5 MG PO TABS: 5.0000 mg | ORAL_TABLET | Freq: Three times a day (TID) | ORAL | 1 refills | Status: DC | PRN

## 2020-07-26 NOTE — Progress Notes (Signed)
TeleHealth Visit:  Due to the COVID-19 pandemic, this visit was completed with telemedicine (audio/video) technology to reduce patient and provider exposure as well as to preserve personal protective equipment.   [name] has verbally consented to this TeleHealth visit. The patient is located at home, the provider is located at the [facility] office. The participants in this visit include the listed provider and patient. The visit was conducted today via MyChart video.   Chief Complaint: OBESITY Leslie Mason is here to discuss her progress with her obesity treatment plan along with follow-up of her obesity related diagnoses. Leslie Mason is on keeping a food journal and adhering to recommended goals of 1300-1500 calories and 85+ grams of protein daily and states she is following her eating plan approximately 75% of the time. Leslie Mason states she is walking for 30-35 minutes 1-2 times per week.  Today's visit was #: 6 Starting weight: 254 lbs Starting date: 05/06/2020  Interim History: Leslie Mason has been working on following her Category 2 plan, but she has had a difficult time due to recent temporomandibular joint exacerbation. She is unable to eat all her protein and has had to make substitutions.   Subjective:   1. TMJ (temporomandibular joint disorder) Leslie Mason has a history of intermittent TMJ, which has flared up recently and making chewing food difficult. She has a dentist appointment in 3 weeks. She is sensitive to the side effect of many medications.  2. At risk for impaired metabolic function Leslie Mason is at increased risk for impaired metabolic function due to decreased protein intake.  Assessment/Plan:   1. TMJ (temporomandibular joint disorder) Kitara agreed to start Flexeril 5 mg 3 times daily, ok to cut in half if needed. We discussed soft protein foods she can eat while recovering.  - cyclobenzaprine (FLEXERIL) 5 MG tablet; Take 1 tablet (5 mg total) by mouth 3 (three)  times daily as needed for muscle spasms.  Dispense: 30 tablet; Refill: 1  2. At risk for impaired metabolic function Leslie Mason was given approximately 15 minutes of impaired  metabolic function prevention counseling today. We discussed intensive lifestyle modifications today with an emphasis on specific nutrition and exercise instructions and strategies.   Repetitive spaced learning was employed today to elicit superior memory formation and behavioral change.  3. Class 2 severe obesity with serious comorbidity and body mass index (BMI) of 38.0 to 38.9 in adult, unspecified obesity type (HCC) Leslie Mason is currently in the action stage of change. As such, her goal is to continue with weight loss efforts. She has agreed to the Category 2 Plan.   Exercise goals: As is.  Behavioral modification strategies: increasing lean protein intake.  Leslie Mason has agreed to follow-up with our clinic in 2 weeks. She was informed of the importance of frequent follow-up visits to maximize her success with intensive lifestyle modifications for her multiple health conditions.  Objective:   VITALS: Per patient if applicable, see vitals. GENERAL: Alert and in no acute distress. CARDIOPULMONARY: No increased WOB. Speaking in clear sentences.  PSYCH: Pleasant and cooperative. Speech normal rate and rhythm. Affect is appropriate. Insight and judgement are appropriate. Attention is focused, linear, and appropriate.  NEURO: Oriented as arrived to appointment on time with no prompting.   Lab Results  Component Value Date   CREATININE 1.10 (H) 05/06/2020   BUN 19 05/06/2020   NA 143 05/06/2020   K 4.8 05/06/2020   CL 101 05/06/2020   CO2 29 05/06/2020   Lab Results  Component Value  Date   ALT 14 05/06/2020   AST 23 05/06/2020   ALKPHOS 117 05/06/2020   BILITOT 0.6 05/06/2020   Lab Results  Component Value Date   HGBA1C 5.6 05/06/2020   HGBA1C 5.7 (H) 06/01/2017   Lab Results  Component Value Date   INSULIN  9.1 05/06/2020   Lab Results  Component Value Date   TSH 1.570 05/06/2020   Lab Results  Component Value Date   CHOL 205 (H) 05/06/2020   HDL 67 05/06/2020   LDLCALC 131 (H) 05/06/2020   TRIG 37 05/06/2020   Lab Results  Component Value Date   WBC 4.8 05/06/2020   HGB 13.6 05/06/2020   HCT 42.5 05/06/2020   MCV 84 05/06/2020   PLT 209 05/06/2020   No results found for: IRON, TIBC, FERRITIN  Attestation Statements:   Reviewed by clinician on day of visit: allergies, medications, problem list, medical history, surgical history, family history, social history, and previous encounter notes.   I, Burt Knack, am acting as transcriptionist for Quillian Quince, MD.  I have reviewed the above documentation for accuracy and completeness, and I agree with the above. - Quillian Quince, MD

## 2020-07-31 ENCOUNTER — Other Ambulatory Visit (INDEPENDENT_AMBULATORY_CARE_PROVIDER_SITE_OTHER): Payer: Self-pay | Admitting: Family Medicine

## 2020-07-31 DIAGNOSIS — I10 Essential (primary) hypertension: Secondary | ICD-10-CM

## 2020-08-04 ENCOUNTER — Other Ambulatory Visit: Payer: Self-pay

## 2020-08-04 ENCOUNTER — Telehealth (INDEPENDENT_AMBULATORY_CARE_PROVIDER_SITE_OTHER): Payer: Self-pay

## 2020-08-04 ENCOUNTER — Encounter (INDEPENDENT_AMBULATORY_CARE_PROVIDER_SITE_OTHER): Payer: Self-pay | Admitting: Family Medicine

## 2020-08-04 ENCOUNTER — Ambulatory Visit (INDEPENDENT_AMBULATORY_CARE_PROVIDER_SITE_OTHER): Payer: 59 | Admitting: Family Medicine

## 2020-08-04 VITALS — BP 124/88 | HR 66 | Temp 97.8°F | Ht 68.0 in | Wt 254.0 lb

## 2020-08-04 DIAGNOSIS — Z9189 Other specified personal risk factors, not elsewhere classified: Secondary | ICD-10-CM | POA: Diagnosis not present

## 2020-08-04 DIAGNOSIS — Z6838 Body mass index (BMI) 38.0-38.9, adult: Secondary | ICD-10-CM

## 2020-08-04 DIAGNOSIS — I10 Essential (primary) hypertension: Secondary | ICD-10-CM

## 2020-08-04 MED ORDER — CHLORTHALIDONE 25 MG PO TABS: 25.0000 mg | ORAL_TABLET | Freq: Every day | ORAL | 0 refills | Status: DC

## 2020-08-05 NOTE — Progress Notes (Signed)
Chief Complaint:   OBESITY Leslie Mason is here to discuss her progress with her obesity treatment plan along with follow-up of her obesity related diagnoses. Leslie Mason is on the Category 2 Plan and states she is following her eating plan approximately 75% of the time. Leslie Mason states she is walking for 40 minutes 1-2 times per week.  Today's visit was #: 7 Starting weight: 254 lbs Starting date: 05/06/2020 Today's weight: 254 lbs Today's date: 08/04/2020 Total lbs lost to date: 0 Total lbs lost since last in-office visit: 1  Interim History: Leslie Mason has had trouble staying with her eating plan due to jaw pain problems and traveling. She has made a lot better food choices and is doing more food prep at home.  Subjective:   1. Essential hypertension Leslie Mason's blood pressure is controlled on her medications. She is working on diet and weight loss.  2. At risk for impaired metabolic function Leslie Mason is at increased risk for impaired metabolic function if protein decreases.  Assessment/Plan:   1. Essential hypertension Leslie Mason is working on healthy weight loss and exercise to improve blood pressure control. We will watch for signs of hypotension as she continues her lifestyle modifications. We will refill chlorthalidone for 1 month.  - chlorthalidone (HYGROTON) 25 MG tablet; Take 1 tablet (25 mg total) by mouth daily.  Dispense: 30 tablet; Refill: 0  2. At risk for impaired metabolic function Leslie Mason was given approximately 15 minutes of impaired  metabolic function prevention counseling today. We discussed intensive lifestyle modifications today with an emphasis on specific nutrition and exercise instructions and strategies.   Repetitive spaced learning was employed today to elicit superior memory formation and behavioral change.  3. Class 2 severe obesity with serious comorbidity and body mass index (BMI) of 38.0 to 38.9 in adult, unspecified obesity type (HCC) Leslie Mason is currently in  the action stage of change. As such, her goal is to continue with weight loss efforts. She has agreed to change to keeping a food journal and adhering to recommended goals of 1200-1400 calories and 80+ grams of protein daily.   Recipes was given today.  Exercise goals: As is.  Behavioral modification strategies: increasing lean protein intake and meal planning and cooking strategies.  Leslie Mason has agreed to follow-up with our clinic in 3 weeks. She was informed of the importance of frequent follow-up visits to maximize her success with intensive lifestyle modifications for her multiple health conditions.   Objective:   Blood pressure 124/88, pulse 66, temperature 97.8 F (36.6 C), height 5\' 8"  (1.727 m), weight 254 lb (115.2 kg), SpO2 98 %. Body mass index is 38.62 kg/m.  General: Cooperative, alert, well developed, in no acute distress. HEENT: Conjunctivae and lids unremarkable. Cardiovascular: Regular rhythm.  Lungs: Normal work of breathing. Neurologic: No focal deficits.   Lab Results  Component Value Date   CREATININE 1.10 (H) 05/06/2020   BUN 19 05/06/2020   NA 143 05/06/2020   K 4.8 05/06/2020   CL 101 05/06/2020   CO2 29 05/06/2020   Lab Results  Component Value Date   ALT 14 05/06/2020   AST 23 05/06/2020   ALKPHOS 117 05/06/2020   BILITOT 0.6 05/06/2020   Lab Results  Component Value Date   HGBA1C 5.6 05/06/2020   HGBA1C 5.7 (H) 06/01/2017   Lab Results  Component Value Date   INSULIN 9.1 05/06/2020   Lab Results  Component Value Date   TSH 1.570 05/06/2020   Lab Results  Component Value  Date   CHOL 205 (H) 05/06/2020   HDL 67 05/06/2020   LDLCALC 131 (H) 05/06/2020   TRIG 37 05/06/2020   Lab Results  Component Value Date   WBC 4.8 05/06/2020   HGB 13.6 05/06/2020   HCT 42.5 05/06/2020   MCV 84 05/06/2020   PLT 209 05/06/2020   No results found for: IRON, TIBC, FERRITIN  Attestation Statements:   Reviewed by clinician on day of visit:  allergies, medications, problem list, medical history, surgical history, family history, social history, and previous encounter notes.   I, Burt Knack, am acting as transcriptionist for Quillian Quince, MD.  I have reviewed the above documentation for accuracy and completeness, and I agree with the above. -  Quillian Quince, MD

## 2020-08-23 ENCOUNTER — Encounter (INDEPENDENT_AMBULATORY_CARE_PROVIDER_SITE_OTHER): Payer: Self-pay | Admitting: Physician Assistant

## 2020-08-23 ENCOUNTER — Ambulatory Visit (INDEPENDENT_AMBULATORY_CARE_PROVIDER_SITE_OTHER): Payer: 59 | Admitting: Physician Assistant

## 2020-08-23 ENCOUNTER — Other Ambulatory Visit: Payer: Self-pay

## 2020-08-23 VITALS — BP 123/81 | HR 63 | Temp 97.7°F | Ht 68.0 in | Wt 257.0 lb

## 2020-08-23 DIAGNOSIS — Z6838 Body mass index (BMI) 38.0-38.9, adult: Secondary | ICD-10-CM | POA: Diagnosis not present

## 2020-08-23 DIAGNOSIS — I10 Essential (primary) hypertension: Secondary | ICD-10-CM

## 2020-08-23 DIAGNOSIS — Z9189 Other specified personal risk factors, not elsewhere classified: Secondary | ICD-10-CM | POA: Diagnosis not present

## 2020-08-23 DIAGNOSIS — E7849 Other hyperlipidemia: Secondary | ICD-10-CM | POA: Diagnosis not present

## 2020-08-23 MED ORDER — CHLORTHALIDONE 25 MG PO TABS: 25.0000 mg | ORAL_TABLET | Freq: Every day | ORAL | 0 refills | Status: DC

## 2020-08-24 NOTE — Progress Notes (Signed)
Chief Complaint:   OBESITY Leslie Mason is here to discuss her progress with her obesity treatment plan along with follow-up of her obesity related diagnoses. Leslie Mason is on keeping a food journal and adhering to recommended goals of 1200-1400 calories and 80+ grams of protein daily and states she is following her eating plan approximately 50% of the time. Leslie Mason states she is doing 0 minutes 0 times per week.  Today's visit was #: 8 Starting weight: 254 lbs Starting date: 05/06/2020 Today's weight: 257 lbs Today's date: 08/23/2020 Total lbs lost to date: 0 Total lbs lost since last in-office visit: 0  Interim History: Leslie Mason travels often for work and struggles with eating out. She has not been journaling consistently. She has a history of gastric sleeve and breaks up her food to get it all in. She is not traveling the next 2 weeks. She has also had an issue with TMJ recently and she is seeing an oral surgeon this week.  Subjective:   1. Essential hypertension Leslie Mason blood pressure is controlled, and she denies edema. She denies chest pain or headache.  2. Other hyperlipidemia Leslie Mason is not on medications, and she denies chest pain. She is not currently exercising.  3. At risk for heart disease Leslie Mason is at a higher than average risk for cardiovascular disease due to obesity.   Assessment/Plan:   1. Essential hypertension Leslie Mason is working on healthy weight loss and exercise to improve blood pressure control. We will watch for signs of hypotension as she continues her lifestyle modifications. We will refill chlorthalidone for 1 month.  - chlorthalidone (HYGROTON) 25 MG tablet; Take 1 tablet (25 mg total) by mouth daily.  Dispense: 30 tablet; Refill: 0  2. Other hyperlipidemia Cardiovascular risk and specific lipid/LDL goals reviewed. We discussed several lifestyle modifications today. Leslie Mason will continue her meal plan, and will continue to work on diet, exercise and  weight loss efforts. Orders and follow up as documented in patient record.   Counseling Intensive lifestyle modifications are the first line treatment for this issue. . Dietary changes: Increase soluble fiber. Decrease simple carbohydrates. . Exercise changes: Moderate to vigorous-intensity aerobic activity 150 minutes per week if tolerated. . Lipid-lowering medications: see documented in medical record.  3. At risk for heart disease Leslie Mason was given approximately 15 minutes of coronary artery disease prevention counseling today. She is 50 y.o. female and has risk factors for heart disease including obesity. We discussed intensive lifestyle modifications today with an emphasis on specific weight loss instructions and strategies.   Repetitive spaced learning was employed today to elicit superior memory formation and behavioral change.  4. Class 2 severe obesity with serious comorbidity and body mass index (BMI) of 38.0 to 38.9 in adult, unspecified obesity type (HCC) Leslie Mason is currently in the action stage of change. As such, her goal is to continue with weight loss efforts. She has agreed to keeping a food journal and adhering to recommended goals of 1400-1600 calories and 95 grams of protein daily.   Exercise goals: No exercise has been prescribed at this time.  Behavioral modification strategies: increasing lean protein intake and no skipping meals.  Leslie Mason has agreed to follow-up with our clinic in 2 weeks. She was informed of the importance of frequent follow-up visits to maximize her success with intensive lifestyle modifications for her multiple health conditions.   Objective:   Blood pressure 123/81, pulse 63, temperature 97.7 F (36.5 C), height 5\' 8"  (1.727 m), weight 257 lb (116.6  kg), SpO2 97 %. Body mass index is 39.08 kg/m.  General: Cooperative, alert, well developed, in no acute distress. HEENT: Conjunctivae and lids unremarkable. Cardiovascular: Regular rhythm.   Lungs: Normal work of breathing. Neurologic: No focal deficits.   Lab Results  Component Value Date   CREATININE 1.10 (H) 05/06/2020   BUN 19 05/06/2020   NA 143 05/06/2020   K 4.8 05/06/2020   CL 101 05/06/2020   CO2 29 05/06/2020   Lab Results  Component Value Date   ALT 14 05/06/2020   AST 23 05/06/2020   ALKPHOS 117 05/06/2020   BILITOT 0.6 05/06/2020   Lab Results  Component Value Date   HGBA1C 5.6 05/06/2020   HGBA1C 5.7 (H) 06/01/2017   Lab Results  Component Value Date   INSULIN 9.1 05/06/2020   Lab Results  Component Value Date   TSH 1.570 05/06/2020   Lab Results  Component Value Date   CHOL 205 (H) 05/06/2020   HDL 67 05/06/2020   LDLCALC 131 (H) 05/06/2020   TRIG 37 05/06/2020   Lab Results  Component Value Date   WBC 4.8 05/06/2020   HGB 13.6 05/06/2020   HCT 42.5 05/06/2020   MCV 84 05/06/2020   PLT 209 05/06/2020   No results found for: IRON, TIBC, FERRITIN  Attestation Statements:   Reviewed by clinician on day of visit: allergies, medications, problem list, medical history, surgical history, family history, social history, and previous encounter notes.   Trude Mcburney, am acting as transcriptionist for [employer], PA-C.  I have reviewed the above documentation for accuracy and completeness, and I agree with the above. -  *Alois Cliche, PA-C

## 2020-08-26 ENCOUNTER — Other Ambulatory Visit: Payer: Self-pay | Admitting: Dentistry

## 2020-08-26 DIAGNOSIS — M26631 Articular disc disorder of right temporomandibular joint: Secondary | ICD-10-CM

## 2020-08-28 ENCOUNTER — Other Ambulatory Visit (INDEPENDENT_AMBULATORY_CARE_PROVIDER_SITE_OTHER): Payer: Self-pay | Admitting: Family Medicine

## 2020-08-28 DIAGNOSIS — I10 Essential (primary) hypertension: Secondary | ICD-10-CM

## 2020-09-06 ENCOUNTER — Other Ambulatory Visit: Payer: Self-pay

## 2020-09-06 ENCOUNTER — Ambulatory Visit (INDEPENDENT_AMBULATORY_CARE_PROVIDER_SITE_OTHER): Payer: 59 | Admitting: Family Medicine

## 2020-09-06 ENCOUNTER — Encounter (INDEPENDENT_AMBULATORY_CARE_PROVIDER_SITE_OTHER): Payer: Self-pay | Admitting: Family Medicine

## 2020-09-06 VITALS — BP 136/84 | HR 54 | Temp 97.7°F | Ht 68.0 in | Wt 257.0 lb

## 2020-09-06 DIAGNOSIS — R7303 Prediabetes: Secondary | ICD-10-CM | POA: Diagnosis not present

## 2020-09-06 DIAGNOSIS — Z6839 Body mass index (BMI) 39.0-39.9, adult: Secondary | ICD-10-CM | POA: Diagnosis not present

## 2020-09-07 NOTE — Progress Notes (Signed)
Chief Complaint:   OBESITY Leslie Mason is here to discuss her progress with her obesity treatment plan along with follow-up of her obesity related diagnoses. Leslie Mason is on keeping a food journal and adhering to recommended goals of 1400-1600 calories and 95 grams of protein daily and states she is following her eating plan approximately 65-70% of the time. Leslie Mason states she is doing 0 minutes 0 times per week.  Today's visit was #: 9 Starting weight: 254 lbs Starting date: 05/06/2020 Today's weight: 257 lbs Today's date: 09/06/2020 Total lbs lost to date: 0 Total lbs lost since last in-office visit: 0  Interim History: Leslie Mason has done well minimizing weight gain. She has to eat soft foods and trying to increase lean protein. She feels her hunger is well controlled but her cravings are more of an issues. She isn't always meeting her calorie goal and protein goals.  Subjective:   1. Pre-diabetes Leslie Mason is working on decreasing simple carbohydrates in her diet. She notes decreased polyphagia, and she denies signs of hypoglycemia.  Assessment/Plan:   1. Pre-diabetes Leslie Mason will continue to work on weight loss, diet, exercise, and decreasing simple carbohydrates to help decrease the risk of diabetes. We will recheck labs in approximately 1 month.  2. Class 2 severe obesity with serious comorbidity and body mass index (BMI) of 39.0 to 39.9 in adult, unspecified obesity type (HCC) Leslie Mason is currently in the action stage of change. As such, her goal is to continue with weight loss efforts. She has agreed to keeping a food journal and adhering to recommended goals of 1400-1600 calories and 90+ grams of protein daily.   Exercise goals: All adults should avoid inactivity. Some physical activity is better than none, and adults who participate in any amount of physical activity gain some health benefits.  Behavioral modification strategies: increasing lean protein intake and meal planning and  cooking strategies.  Leslie Mason has agreed to follow-up with our clinic in 2 to 3 weeks. She was informed of the importance of frequent follow-up visits to maximize her success with intensive lifestyle modifications for her multiple health conditions.   Objective:   Blood pressure 136/84, pulse (!) 54, temperature 97.7 F (36.5 C), temperature source Oral, height 5\' 8"  (1.727 m), weight 257 lb (116.6 kg), SpO2 97 %. Body mass index is 39.08 kg/m.  General: Cooperative, alert, well developed, in no acute distress. HEENT: Conjunctivae and lids unremarkable. Cardiovascular: Regular rhythm.  Lungs: Normal work of breathing. Neurologic: No focal deficits.   Lab Results  Component Value Date   CREATININE 1.10 (H) 05/06/2020   BUN 19 05/06/2020   NA 143 05/06/2020   K 4.8 05/06/2020   CL 101 05/06/2020   CO2 29 05/06/2020   Lab Results  Component Value Date   ALT 14 05/06/2020   AST 23 05/06/2020   ALKPHOS 117 05/06/2020   BILITOT 0.6 05/06/2020   Lab Results  Component Value Date   HGBA1C 5.6 05/06/2020   HGBA1C 5.7 (H) 06/01/2017   Lab Results  Component Value Date   INSULIN 9.1 05/06/2020   Lab Results  Component Value Date   TSH 1.570 05/06/2020   Lab Results  Component Value Date   CHOL 205 (H) 05/06/2020   HDL 67 05/06/2020   LDLCALC 131 (H) 05/06/2020   TRIG 37 05/06/2020   Lab Results  Component Value Date   WBC 4.8 05/06/2020   HGB 13.6 05/06/2020   HCT 42.5 05/06/2020   MCV 84 05/06/2020  PLT 209 05/06/2020   No results found for: IRON, TIBC, FERRITIN  Attestation Statements:   Reviewed by clinician on day of visit: allergies, medications, problem list, medical history, surgical history, family history, social history, and previous encounter notes.  Time spent on visit including pre-visit chart review and post-visit care and charting was 22 minutes.    I, Burt Knack, am acting as transcriptionist for Quillian Quince, MD.  I have reviewed the  above documentation for accuracy and completeness, and I agree with the above. -  Quillian Quince, MD

## 2020-09-10 ENCOUNTER — Ambulatory Visit
Admission: RE | Admit: 2020-09-10 | Discharge: 2020-09-10 | Disposition: A | Discharge status: Home / Self Care | Payer: 59 | Source: Ambulatory Visit | Attending: Dentistry | Admitting: Dentistry

## 2020-09-10 ENCOUNTER — Other Ambulatory Visit: Payer: Self-pay

## 2020-09-10 DIAGNOSIS — M26631 Articular disc disorder of right temporomandibular joint: Secondary | ICD-10-CM

## 2020-09-14 ENCOUNTER — Ambulatory Visit: Payer: 59 | Admitting: Skilled Nursing Facility1

## 2020-09-20 ENCOUNTER — Ambulatory Visit (INDEPENDENT_AMBULATORY_CARE_PROVIDER_SITE_OTHER): Payer: 59 | Admitting: Physician Assistant

## 2020-09-20 ENCOUNTER — Other Ambulatory Visit: Payer: Self-pay

## 2020-09-20 VITALS — BP 124/83 | HR 57 | Temp 98.1°F | Ht 68.0 in | Wt 257.0 lb

## 2020-09-20 DIAGNOSIS — I10 Essential (primary) hypertension: Secondary | ICD-10-CM | POA: Diagnosis not present

## 2020-09-20 DIAGNOSIS — Z9189 Other specified personal risk factors, not elsewhere classified: Secondary | ICD-10-CM

## 2020-09-20 DIAGNOSIS — R7303 Prediabetes: Secondary | ICD-10-CM

## 2020-09-20 DIAGNOSIS — Z6839 Body mass index (BMI) 39.0-39.9, adult: Secondary | ICD-10-CM

## 2020-09-20 MED ORDER — CHLORTHALIDONE 25 MG PO TABS: 25.0000 mg | ORAL_TABLET | Freq: Every day | ORAL | 0 refills | Status: DC

## 2020-09-23 NOTE — Progress Notes (Signed)
Chief Complaint:   OBESITY Leslie Mason is here to discuss her progress with her obesity treatment plan along with follow-up of her obesity related diagnoses. Leslie Mason is on keeping a food journal and adhering to recommended goals of 1400-1600 calories and 90+ grams of protein daily and states she is following her eating plan approximately 90-95% of the time. Leslie Mason states she is doing exercise videos and walking for 30 minutes 2 times per week.  Today's visit was #: 10 Starting weight: 254 lbs Starting date: 05/06/2020 Today's weight: 257 lbs Today's date: 09/20/2020 Total lbs lost to date: 0 Total lbs lost since last in-office visit: 0  Interim History: Leslie Mason reports that she has been journaling well on the L[employer] It app. Most days she isn't hitting her protein goal. She is not traveling the next 2 weeks, so she will have more times to meal plan.  Subjective:   1. Essential hypertension Leslie Mason is on Toprol and chlorthalidone. Her blood pressure is controlled, and she denies chest pain or headache.  2. Pre-diabetes Leslie Mason is not on medications, and she denies polyphagia. She is exercising 2 times per week.  3. At risk for diabetes mellitus Leslie Mason is at higher than average risk for developing diabetes due to obesity.   Assessment/Plan:   1. Essential hypertension Leslie Mason will continue with her medications, healthy weight loss, and exercise to improve blood pressure control. We will watch for signs of hypotension as she continues her lifestyle modifications. We will refill chlorthalidone for 1 month.  - chlorthalidone (HYGROTON) 25 MG tablet; Take 1 tablet (25 mg total) by mouth daily.  Dispense: 30 tablet; Refill: 0  2. Pre-diabetes Leslie Mason will continue her meal plan, and will continue to work on weight loss, increasing exercise, and decreasing simple carbohydrates to help decrease the risk of diabetes.   3. At risk for diabetes mellitus Leslie Mason was given approximately 15  minutes of diabetes education and counseling today. We discussed intensive lifestyle modifications today with an emphasis on weight loss as well as increasing exercise and decreasing simple carbohydrates in her diet. We also reviewed medication options with an emphasis on risk versus benefit of those discussed.   Repetitive spaced learning was employed today to elicit superior memory formation and behavioral change.  4. Class 2 severe obesity with serious comorbidity and body mass index (BMI) of 39.0 to 39.9 in adult, unspecified obesity type (HCC) Leslie Mason is currently in the action stage of change. As such, her goal is to continue with weight loss efforts. She has agreed to keeping a food journal and adhering to recommended goals of 1400-1600 calories and 95 grams of protein daily.   Exercise goals: As is.  Behavioral modification strategies: meal planning and cooking strategies and planning for success.  Leslie Mason has agreed to follow-up with our clinic in 2 weeks. She was informed of the importance of frequent follow-up visits to maximize her success with intensive lifestyle modifications for her multiple health conditions.   Objective:   Blood pressure 124/83, pulse (!) 57, temperature 98.1 F (36.7 C), height 5\' 8"  (1.727 m), weight 257 lb (116.6 kg), SpO2 97 %. Body mass index is 39.08 kg/m.  General: Cooperative, alert, well developed, in no acute distress. HEENT: Conjunctivae and lids unremarkable. Cardiovascular: Regular rhythm.  Lungs: Normal work of breathing. Neurologic: No focal deficits.   Lab Results  Component Value Date   CREATININE 1.10 (H) 05/06/2020   BUN 19 05/06/2020   NA 143 05/06/2020   K 4.8  05/06/2020   CL 101 05/06/2020   CO2 29 05/06/2020   Lab Results  Component Value Date   ALT 14 05/06/2020   AST 23 05/06/2020   ALKPHOS 117 05/06/2020   BILITOT 0.6 05/06/2020   Lab Results  Component Value Date   HGBA1C 5.6 05/06/2020   HGBA1C 5.7 (H)  06/01/2017   Lab Results  Component Value Date   INSULIN 9.1 05/06/2020   Lab Results  Component Value Date   TSH 1.570 05/06/2020   Lab Results  Component Value Date   CHOL 205 (H) 05/06/2020   HDL 67 05/06/2020   LDLCALC 131 (H) 05/06/2020   TRIG 37 05/06/2020   Lab Results  Component Value Date   WBC 4.8 05/06/2020   HGB 13.6 05/06/2020   HCT 42.5 05/06/2020   MCV 84 05/06/2020   PLT 209 05/06/2020   No results found for: IRON, TIBC, FERRITIN  Attestation Statements:   Reviewed by clinician on day of visit: allergies, medications, problem list, medical history, surgical history, family history, social history, and previous encounter notes.   Trude Mcburney, am acting as transcriptionist for [employer], PA-C.  I have reviewed the above documentation for accuracy and completeness, and I agree with the above. Alois Cliche, PA-C

## 2020-09-24 ENCOUNTER — Other Ambulatory Visit (INDEPENDENT_AMBULATORY_CARE_PROVIDER_SITE_OTHER): Payer: Self-pay | Admitting: Physician Assistant

## 2020-09-24 DIAGNOSIS — I10 Essential (primary) hypertension: Secondary | ICD-10-CM

## 2020-09-30 ENCOUNTER — Encounter (INDEPENDENT_AMBULATORY_CARE_PROVIDER_SITE_OTHER): Payer: Self-pay | Admitting: Family Medicine

## 2020-10-11 ENCOUNTER — Ambulatory Visit (INDEPENDENT_AMBULATORY_CARE_PROVIDER_SITE_OTHER): Payer: 59 | Admitting: Family Medicine

## 2020-10-11 ENCOUNTER — Encounter (INDEPENDENT_AMBULATORY_CARE_PROVIDER_SITE_OTHER): Payer: Self-pay | Admitting: Family Medicine

## 2020-10-11 ENCOUNTER — Other Ambulatory Visit: Payer: Self-pay

## 2020-10-11 VITALS — BP 129/84 | HR 64 | Temp 98.0°F | Ht 68.0 in | Wt 255.0 lb

## 2020-10-11 DIAGNOSIS — Z9189 Other specified personal risk factors, not elsewhere classified: Secondary | ICD-10-CM | POA: Diagnosis not present

## 2020-10-11 DIAGNOSIS — I10 Essential (primary) hypertension: Secondary | ICD-10-CM | POA: Diagnosis not present

## 2020-10-11 DIAGNOSIS — Z6838 Body mass index (BMI) 38.0-38.9, adult: Secondary | ICD-10-CM | POA: Diagnosis not present

## 2020-10-11 MED ORDER — CHLORTHALIDONE 25 MG PO TABS: 25.0000 mg | ORAL_TABLET | Freq: Every day | ORAL | 0 refills | Status: DC

## 2020-10-13 ENCOUNTER — Other Ambulatory Visit (INDEPENDENT_AMBULATORY_CARE_PROVIDER_SITE_OTHER): Payer: Self-pay | Admitting: Physician Assistant

## 2020-10-13 DIAGNOSIS — I10 Essential (primary) hypertension: Secondary | ICD-10-CM

## 2020-10-13 NOTE — Telephone Encounter (Signed)
Dr.Beasley 

## 2020-10-20 NOTE — Progress Notes (Signed)
Chief Complaint:   OBESITY Leslie Mason is here to discuss her progress with her obesity treatment plan along with follow-up of her obesity related diagnoses. Leslie Mason is on keeping a food journal and adhering to recommended goals of 1400-1600 calories and 95 grams of protein daily and states she is following her eating plan approximately 40% of the time. Leslie Mason states she is walking for 40 minutes 3 times per week.  Today's visit was #: 11 Starting weight: 254 lbs Starting date: 05/06/2020 Today's weight: 255 lbs Today's date: 10/11/2020 Total lbs lost to date: 0 Total lbs lost since last in-office visit: 2  Interim History: Leslie Mason is recovering from dental surgery. She is limited to softer foods and she is trying to increase her protein. She is working on increasing exercise but this has been difficult.  Subjective:   1. Essential hypertension Leslie Mason's blood pressure is stable on her medications. She requests a refill today.  2. At risk for impaired metabolic function Leslie Mason is at increased risk for impaired metabolic function if protein decreases.  Assessment/Plan:   1. Essential hypertension Leslie Mason is working on healthy weight loss and exercise to improve blood pressure control. We will watch for signs of hypotension as she continues her lifestyle modifications. We will refill chlorthalidone for 1 month.  - chlorthalidone (HYGROTON) 25 MG tablet; Take 1 tablet (25 mg total) by mouth daily.  Dispense: 30 tablet; Refill: 0  2. At risk for impaired metabolic function Leslie Mason was given approximately 15 minutes of impaired  metabolic function prevention counseling today. We discussed intensive lifestyle modifications today with an emphasis on specific nutrition and exercise instructions and strategies.   Repetitive spaced learning was employed today to elicit superior memory formation and behavioral change.  3. Obesity with current BMI of 38.8 Leslie Mason is currently in the  action stage of change. As such, her goal is to continue with weight loss efforts. She has agreed to the Category 2 Plan.   Exercise goals: As is.  Behavioral modification strategies: increasing lean protein intake and dealing with family or coworker sabotage.  Leslie Mason has agreed to follow-up with our clinic in 2 weeks. She was informed of the importance of frequent follow-up visits to maximize her success with intensive lifestyle modifications for her multiple health conditions.   Objective:   Blood pressure 129/84, pulse 64, temperature 98 F (36.7 C), height 5\' 8"  (1.727 m), weight 255 lb (115.7 kg), SpO2 100 %. Body mass index is 38.77 kg/m.  General: Cooperative, alert, well developed, in no acute distress. HEENT: Conjunctivae and lids unremarkable. Cardiovascular: Regular rhythm.  Lungs: Normal work of breathing. Neurologic: No focal deficits.   Lab Results  Component Value Date   CREATININE 1.10 (H) 05/06/2020   BUN 19 05/06/2020   NA 143 05/06/2020   K 4.8 05/06/2020   CL 101 05/06/2020   CO2 29 05/06/2020   Lab Results  Component Value Date   ALT 14 05/06/2020   AST 23 05/06/2020   ALKPHOS 117 05/06/2020   BILITOT 0.6 05/06/2020   Lab Results  Component Value Date   HGBA1C 5.6 05/06/2020   HGBA1C 5.7 (H) 06/01/2017   Lab Results  Component Value Date   INSULIN 9.1 05/06/2020   Lab Results  Component Value Date   TSH 1.570 05/06/2020   Lab Results  Component Value Date   CHOL 205 (H) 05/06/2020   HDL 67 05/06/2020   LDLCALC 131 (H) 05/06/2020   TRIG 37 05/06/2020   Lab  Results  Component Value Date   WBC 4.8 05/06/2020   HGB 13.6 05/06/2020   HCT 42.5 05/06/2020   MCV 84 05/06/2020   PLT 209 05/06/2020   No results found for: IRON, TIBC, FERRITIN  Attestation Statements:   Reviewed by clinician on day of visit: allergies, medications, problem list, medical history, surgical history, family history, social history, and previous encounter  notes.   I, Burt Knack, am acting as transcriptionist for Quillian Quince, MD.  I have reviewed the above documentation for accuracy and completeness, and I agree with the above. -  Quillian Quince, MD

## 2020-11-03 ENCOUNTER — Ambulatory Visit (INDEPENDENT_AMBULATORY_CARE_PROVIDER_SITE_OTHER): Payer: 59 | Admitting: Physician Assistant

## 2020-11-05 ENCOUNTER — Other Ambulatory Visit (INDEPENDENT_AMBULATORY_CARE_PROVIDER_SITE_OTHER): Payer: Self-pay | Admitting: Physician Assistant

## 2020-11-05 DIAGNOSIS — I10 Essential (primary) hypertension: Secondary | ICD-10-CM

## 2020-11-08 ENCOUNTER — Ambulatory Visit (INDEPENDENT_AMBULATORY_CARE_PROVIDER_SITE_OTHER): Payer: 59 | Admitting: Physician Assistant

## 2020-11-08 ENCOUNTER — Encounter (INDEPENDENT_AMBULATORY_CARE_PROVIDER_SITE_OTHER): Payer: Self-pay | Admitting: Physician Assistant

## 2020-11-08 ENCOUNTER — Other Ambulatory Visit: Payer: Self-pay

## 2020-11-08 VITALS — BP 107/74 | HR 55 | Temp 98.5°F | Ht 68.0 in | Wt 258.0 lb

## 2020-11-08 DIAGNOSIS — I10 Essential (primary) hypertension: Secondary | ICD-10-CM

## 2020-11-08 DIAGNOSIS — E7849 Other hyperlipidemia: Secondary | ICD-10-CM | POA: Diagnosis not present

## 2020-11-08 DIAGNOSIS — Z9189 Other specified personal risk factors, not elsewhere classified: Secondary | ICD-10-CM | POA: Diagnosis not present

## 2020-11-08 DIAGNOSIS — Z6839 Body mass index (BMI) 39.0-39.9, adult: Secondary | ICD-10-CM

## 2020-11-08 DIAGNOSIS — R7303 Prediabetes: Secondary | ICD-10-CM

## 2020-11-08 DIAGNOSIS — E559 Vitamin D deficiency, unspecified: Secondary | ICD-10-CM

## 2020-11-08 MED ORDER — CHLORTHALIDONE 25 MG PO TABS: 25.0000 mg | ORAL_TABLET | Freq: Every day | ORAL | 0 refills | Status: DC

## 2020-11-08 NOTE — Progress Notes (Signed)
Chief Complaint:   OBESITY Leslie Mason is here to discuss her progress with her obesity treatment plan along with follow-up of her obesity related diagnoses. Leslie Mason is on the Category 2 Plan and states she is following her eating plan approximately 65% of the time. Leslie Mason states she was walking (went to [place]).   Today's visit was #: 12 Starting weight: 254 lbs Starting date: 05/06/2020 Today's weight: 258 lbs Today's date: 11/08/2020 Total lbs lost to date: 0 Total lbs lost since last in-office visit: 0  Interim History: Leslie Mason traveled to [place] for a few days and she has been back for 2 weeks. 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 She has not yet gotten back to journaling, but she plans to restart today.  Subjective:   1. Pre-diabetes Leslie Mason is not on medications, and she denies polyphagia.  2. Other hyperlipidemia Leslie Mason is not on medications, and she denies chest pain.  3. Vitamin D deficiency Leslie Mason is not on Vit D, and her last Vit D level was elevated.  4. Essential hypertension Leslie Mason is on chlorthalidone and metoprolol. Her blood pressure is controlled and she denies chest pain.  5. At risk for heart disease Leslie Mason is at a higher than average risk for cardiovascular disease due to obesity.   Assessment/Plan:   1. Pre-diabetes Leslie Mason will continue her meal plan, and will continue to work on weight loss, exercise, and decreasing simple carbohydrates to help decrease the risk of diabetes. We will check labs today.  - Comprehensive metabolic panel - Hemoglobin A1c - Insulin, random  2. Other hyperlipidemia Cardiovascular risk and specific lipid/LDL goals reviewed. We discussed several lifestyle modifications today. We will check labs today. Leslie Mason will continue to work on diet, exercise and weight loss efforts. Orders and follow up as documented in patient record.   Counseling Intensive lifestyle modifications are the first  line treatment for this issue. . Dietary changes: Increase soluble fiber. Decrease simple carbohydrates. . Exercise changes: Moderate to vigorous-intensity aerobic activity 150 minutes per week if tolerated. . Lipid-lowering medications: see documented in medical record.  - Lipid panel  3. Vitamin D deficiency Low Vitamin D level contributes to fatigue and are associated with obesity, breast, and colon cancer. We will check labs today. Marykathryn will follow-up for routine testing of Vitamin D, at least 2-3 times per year to avoid over-replacement.  - VITAMIN D 25 Hydroxy (Vit-D Deficiency, Fractures)  4. Essential hypertension Leslie Mason will continue working on healthy weight loss and exercise to improve blood pressure control. We will refill chlorthalidone for 1 month, and will watch for signs of hypotension as she continues her lifestyle modifications.  - chlorthalidone (HYGROTON) 25 MG tablet; Take 1 tablet (25 mg total) by mouth daily.  Dispense: 30 tablet; Refill: 0  5. At risk for heart disease Leslie Mason was given approximately 15 minutes of coronary artery disease prevention counseling today. She is 50 y.o. female and has risk factors for heart disease including obesity. We discussed intensive lifestyle modifications today with an emphasis on specific weight loss instructions and strategies.   Repetitive spaced learning was employed today to elicit superior memory formation and behavioral change.  6. Class 2 severe obesity with serious comorbidity and body mass index (BMI) of 39.0 to 39.9 in adult, unspecified obesity type (HCC) Leslie Mason is currently in the action stage of change. As such, her goal is to continue with weight loss efforts. She has agreed to keeping a food journal  and adhering to recommended goals of 1400-1600 calories and 95 grams of protein daily.   Exercise goals: No exercise has been prescribed at this time.  Behavioral modification strategies: increasing lean protein  intake and meal planning and cooking strategies.  Leslie Mason has agreed to follow-up with our clinic in 2 weeks. She was informed of the importance of frequent follow-up visits to maximize her success with intensive lifestyle modifications for her multiple health conditions.   Leslie Mason was informed we would discuss her lab results at her next visit unless there is a critical issue that needs to be addressed sooner. Leslie Mason agreed to keep her next visit at the agreed upon time to discuss these results.  Objective:   Blood pressure 107/74, pulse (!) 55, temperature 98.5 F (36.9 C), height 5\' 8"  (1.727 m), weight 258 lb (117 kg), SpO2 98 %. Body mass index is 39.23 kg/m.  General: Cooperative, alert, well developed, in no acute distress. HEENT: Conjunctivae and lids unremarkable. Cardiovascular: Regular rhythm.  Lungs: Normal work of breathing. Neurologic: No focal deficits.   Lab Results  Component Value Date   CREATININE 1.10 (H) 05/06/2020   BUN 19 05/06/2020   NA 143 05/06/2020   K 4.8 05/06/2020   CL 101 05/06/2020   CO2 29 05/06/2020   Lab Results  Component Value Date   ALT 14 05/06/2020   AST 23 05/06/2020   ALKPHOS 117 05/06/2020   BILITOT 0.6 05/06/2020   Lab Results  Component Value Date   HGBA1C 5.6 05/06/2020   HGBA1C 5.7 (H) 06/01/2017   Lab Results  Component Value Date   INSULIN 9.1 05/06/2020   Lab Results  Component Value Date   TSH 1.570 05/06/2020   Lab Results  Component Value Date   CHOL 205 (H) 05/06/2020   HDL 67 05/06/2020   LDLCALC 131 (H) 05/06/2020   TRIG 37 05/06/2020   Lab Results  Component Value Date   WBC 4.8 05/06/2020   HGB 13.6 05/06/2020   HCT 42.5 05/06/2020   MCV 84 05/06/2020   PLT 209 05/06/2020   No results found for: IRON, TIBC, FERRITIN  Attestation Statements:   Reviewed by clinician on day of visit: allergies, medications, problem list, medical history, surgical history, family history, social history, and  previous encounter notes.   13/10/2019, am acting as transcriptionist for Trude Mcburney, PA-C.  I have reviewed the above documentation for accuracy and completeness, and I agree with the above. [employer], PA-C

## 2020-11-09 LAB — COMPREHENSIVE METABOLIC PANEL
ALT: 10 IU/L (ref 0–32)
AST: 21 IU/L (ref 0–40)
Albumin/Globulin Ratio: 1.7 (ref 1.2–2.2)
Albumin: 4.5 g/dL (ref 3.8–4.8)
Alkaline Phosphatase: 114 IU/L (ref 44–121)
BUN/Creatinine Ratio: 23 (ref 9–23)
BUN: 22 mg/dL (ref 6–24)
Bilirubin Total: 0.7 mg/dL (ref 0.0–1.2)
CO2: 30 mmol/L — ABNORMAL HIGH (ref 20–29)
Calcium: 9.9 mg/dL (ref 8.7–10.2)
Chloride: 99 mmol/L (ref 96–106)
Creatinine, Ser: 0.94 mg/dL (ref 0.57–1.00)
Globulin, Total: 2.7 g/dL (ref 1.5–4.5)
Glucose: 80 mg/dL (ref 65–99)
Potassium: 4.6 mmol/L (ref 3.5–5.2)
Sodium: 141 mmol/L (ref 134–144)
Total Protein: 7.2 g/dL (ref 6.0–8.5)
eGFR: 74 mL/min/{1.73_m2} (ref >59)

## 2020-11-09 LAB — HEMOGLOBIN A1C
Est. average glucose Bld gHb Est-mCnc: 120 mg/dL
Hgb A1c MFr Bld: 5.8 % — ABNORMAL HIGH (ref 4.8–5.6)

## 2020-11-09 LAB — LIPID PANEL
Chol/HDL Ratio: 3.3 ratio (ref 0.0–4.4)
Cholesterol, Total: 217 mg/dL — ABNORMAL HIGH (ref 100–199)
HDL: 65 mg/dL (ref >39)
LDL Chol Calc (NIH): 143 mg/dL — ABNORMAL HIGH (ref 0–99)
Triglycerides: 53 mg/dL (ref 0–149)
VLDL Cholesterol Cal: 9 mg/dL (ref 5–40)

## 2020-11-09 LAB — VITAMIN D 25 HYDROXY (VIT D DEFICIENCY, FRACTURES): Vit D, 25-Hydroxy: 77.2 ng/mL (ref 30.0–100.0)

## 2020-11-09 LAB — INSULIN, RANDOM: INSULIN: 7.1 u[IU]/mL (ref 2.6–24.9)

## 2020-11-23 ENCOUNTER — Encounter (INDEPENDENT_AMBULATORY_CARE_PROVIDER_SITE_OTHER): Payer: Self-pay | Admitting: Physician Assistant

## 2020-11-23 ENCOUNTER — Ambulatory Visit (INDEPENDENT_AMBULATORY_CARE_PROVIDER_SITE_OTHER): Payer: 59 | Admitting: Physician Assistant

## 2020-11-23 ENCOUNTER — Other Ambulatory Visit: Payer: Self-pay

## 2020-11-23 VITALS — BP 129/82 | HR 59 | Temp 98.0°F | Ht 68.0 in | Wt 259.0 lb

## 2020-11-23 DIAGNOSIS — E559 Vitamin D deficiency, unspecified: Secondary | ICD-10-CM

## 2020-11-23 DIAGNOSIS — R7303 Prediabetes: Secondary | ICD-10-CM

## 2020-11-23 DIAGNOSIS — Z6839 Body mass index (BMI) 39.0-39.9, adult: Secondary | ICD-10-CM

## 2020-11-23 DIAGNOSIS — Z9189 Other specified personal risk factors, not elsewhere classified: Secondary | ICD-10-CM | POA: Diagnosis not present

## 2020-11-23 NOTE — Progress Notes (Signed)
Chief Complaint:   OBESITY Leslie Mason is here to discuss her progress with her obesity treatment plan along with follow-up of her obesity related diagnoses. Leslie Mason is on keeping a food journal and adhering to recommended goals of 1400-1600 calories and 95 grams of protein daily and states she is following her eating plan approximately 35% of the time. Leslie Mason states she is doing 0 minutes 0 times per week.  Today's visit was #: 13 Starting weight: 254 lbs Starting date: 05/06/2020 Today's weight: 259 lbs Today's date: 11/23/2020 Total lbs lost to date: 0 Total lbs lost since last in-office visit: 0  Interim History: Leslie Mason had an oral surgery recently and she has had to eat soft foods. She restarted journaling last week.  Subjective:   1. Pre-diabetes Leslie Mason's last A1c was 5.8, worsening from 5.6. She is not on medications. I discussed labs with the patient today.  2. Vitamin D deficiency Leslie Mason's last Vit D level was 77.2, which is at goal. She is not on medications. I discussed labs with the patient today.  3. At risk for diabetes mellitus Leslie Mason is at higher than average risk for developing diabetes due to obesity.   Assessment/Plan:   1. Pre-diabetes Leslie Mason declines metformin, and she will continue to work on weight loss, exercise, and decreasing simple carbohydrates to help decrease the risk of diabetes.   2. Vitamin D deficiency Low Vitamin D level contributes to fatigue and are associated with obesity, breast, and colon cancer. We will recheck Vit D level in 3 months. Leslie Mason is to add Vitamin D 2,000 units daily. She will follow-up for routine testing of Vitamin D, at least 2-3 times per year to avoid over-replacement.  3. At risk for diabetes mellitus Leslie Mason was given approximately 15 minutes of diabetes education and counseling today. We discussed intensive lifestyle modifications today with an emphasis on weight loss as well as increasing exercise and  decreasing simple carbohydrates in her diet. We also reviewed medication options with an emphasis on risk versus benefit of those discussed.   Repetitive spaced learning was employed today to elicit superior memory formation and behavioral change.  4. Class 2 severe obesity with serious comorbidity and body mass index (BMI) of 39.0 to 39.9 in adult, unspecified obesity type (HCC) Leslie Mason is currently in the action stage of change. As such, her goal is to continue with weight loss efforts. She has agreed to keeping a food journal and adhering to recommended goals of 1400-1600 calories and 95 grams of protein daily.   Exercise goals: No exercise has been prescribed at this time.  Behavioral modification strategies: increasing lean protein intake and decreasing simple carbohydrates.  Leslie Mason has agreed to follow-up with our clinic in 3 weeks. She was informed of the importance of frequent follow-up visits to maximize her success with intensive lifestyle modifications for her multiple health conditions.   Objective:   Blood pressure 129/82, pulse (!) 59, temperature 98 F (36.7 C), height 5\' 8"  (1.727 m), weight 259 lb (117.5 kg), SpO2 99 %. Body mass index is 39.38 kg/m.  General: Cooperative, alert, well developed, in no acute distress. HEENT: Conjunctivae and lids unremarkable. Cardiovascular: Regular rhythm.  Lungs: Normal work of breathing. Neurologic: No focal deficits.   Lab Results  Component Value Date   CREATININE 0.94 11/08/2020   BUN 22 11/08/2020   NA 141 11/08/2020   K 4.6 11/08/2020   CL 99 11/08/2020   CO2 30 (H) 11/08/2020   Lab Results  Component  Value Date   ALT 10 11/08/2020   AST 21 11/08/2020   ALKPHOS 114 11/08/2020   BILITOT 0.7 11/08/2020   Lab Results  Component Value Date   HGBA1C 5.8 (H) 11/08/2020   HGBA1C 5.6 05/06/2020   HGBA1C 5.7 (H) 06/01/2017   Lab Results  Component Value Date   INSULIN 7.1 11/08/2020   INSULIN 9.1 05/06/2020   Lab  Results  Component Value Date   TSH 1.570 05/06/2020   Lab Results  Component Value Date   CHOL 217 (H) 11/08/2020   HDL 65 11/08/2020   LDLCALC 143 (H) 11/08/2020   TRIG 53 11/08/2020   CHOLHDL 3.3 11/08/2020   Lab Results  Component Value Date   WBC 4.8 05/06/2020   HGB 13.6 05/06/2020   HCT 42.5 05/06/2020   MCV 84 05/06/2020   PLT 209 05/06/2020   No results found for: IRON, TIBC, FERRITIN  Attestation Statements:   Reviewed by clinician on day of visit: allergies, medications, problem list, medical history, surgical history, family history, social history, and previous encounter notes.   Trude Mcburney, am acting as transcriptionist for [employer], PA-C.  I have reviewed the above documentation for accuracy and completeness, and I agree with the above. Alois Cliche, PA-C

## 2020-12-04 ENCOUNTER — Other Ambulatory Visit (INDEPENDENT_AMBULATORY_CARE_PROVIDER_SITE_OTHER): Payer: Self-pay | Admitting: Family Medicine

## 2020-12-04 DIAGNOSIS — I10 Essential (primary) hypertension: Secondary | ICD-10-CM

## 2020-12-06 NOTE — Telephone Encounter (Signed)
Pt last seen by Tracey Aguilar, PA-C.  

## 2020-12-09 NOTE — Telephone Encounter (Signed)
Would you like to refill or wait until pt's next appt?

## 2020-12-14 NOTE — Telephone Encounter (Signed)
Can this medication be refilled for pt?

## 2020-12-23 ENCOUNTER — Other Ambulatory Visit: Payer: Self-pay

## 2020-12-23 ENCOUNTER — Encounter (INDEPENDENT_AMBULATORY_CARE_PROVIDER_SITE_OTHER): Payer: Self-pay | Admitting: Family Medicine

## 2020-12-23 ENCOUNTER — Ambulatory Visit (INDEPENDENT_AMBULATORY_CARE_PROVIDER_SITE_OTHER): Payer: 59 | Admitting: Family Medicine

## 2020-12-23 VITALS — BP 120/82 | HR 60 | Temp 98.4°F | Ht 68.0 in | Wt 259.0 lb

## 2020-12-23 DIAGNOSIS — I10 Essential (primary) hypertension: Secondary | ICD-10-CM | POA: Diagnosis not present

## 2020-12-23 DIAGNOSIS — Z6838 Body mass index (BMI) 38.0-38.9, adult: Secondary | ICD-10-CM | POA: Diagnosis not present

## 2020-12-23 DIAGNOSIS — Z9189 Other specified personal risk factors, not elsewhere classified: Secondary | ICD-10-CM

## 2020-12-23 MED ORDER — CHLORTHALIDONE 25 MG PO TABS: 25.0000 mg | ORAL_TABLET | Freq: Every day | ORAL | 0 refills | Status: DC

## 2020-12-29 NOTE — Progress Notes (Signed)
Chief Complaint:   OBESITY Leslie Mason is here to discuss her progress with her obesity treatment plan along with follow-up of her obesity related diagnoses. Leslie Mason is on keeping a food journal and adhering to recommended goals of 1400-1600 calories and 95 grams of protein daily and states she is following her eating plan approximately 75% of the time. Leslie Mason states she is walking for 1.5 hours 6 times per week.   Today's visit was #: 14 Starting weight: 254 lbs Starting date: 05/06/2020 Today's weight: 259 lbs Today's date: 12/23/2020 Total lbs lost to date: 0 Total lbs lost since last in-office visit: 0  Interim History: Leslie Mason has done well with maintaining her weight. She feels she is doing well with meeting her calorie goal, but she struggles with protein.  Subjective:   1. Essential hypertension Leslie Mason's blood pressure is well controlled on her medications. She denies chest pain, headache, or lightheadedness.  2. At risk for dehydration Leslie Mason is at risk for dehydration due to heat.  Assessment/Plan:   1. Essential hypertension Leslie Mason will increase her water intake, and will continue working on healthy weight loss and exercise to improve blood pressure control. We will watch for signs of hypotension as she continues her lifestyle modifications. We will refill chlorthalidone for 1 month.  - chlorthalidone (HYGROTON) 25 MG tablet; Take 1 tablet (25 mg total) by mouth daily.  Dispense: 30 tablet; Refill: 0  2. At risk for dehydration Leslie Mason was given approximately 15 minutes dehydration prevention counseling today. Leslie Mason is at risk for dehydration due to weight loss and current medication(s). She was encouraged to hydrate and monitor fluid status to avoid dehydration as well as weight loss plateaus.   3. Obesity with current BMI 39.5 Leslie Mason is currently in the action stage of change. As such, her goal is to continue with weight loss efforts. She has agreed to keeping  a food journal and adhering to recommended goals of 1400-1600 calories and 95+ grams of protein daily.   Protein rich foods and snacks were discussed.  Exercise goals: As is.  Behavioral modification strategies: increasing lean protein intake and increasing water intake.  Leslie Mason has agreed to follow-up with our clinic in 3 to 4 weeks. She was informed of the importance of frequent follow-up visits to maximize her success with intensive lifestyle modifications for her multiple health conditions.   Objective:   Blood pressure 120/82, pulse 60, temperature 98.4 F (36.9 C), height 5\' 8"  (1.727 m), weight 259 lb (117.5 kg), SpO2 98 %. Body mass index is 39.38 kg/m.  General: Cooperative, alert, well developed, in no acute distress. HEENT: Conjunctivae and lids unremarkable. Cardiovascular: Regular rhythm.  Lungs: Normal work of breathing. Neurologic: No focal deficits.   Lab Results  Component Value Date   CREATININE 0.94 11/08/2020   BUN 22 11/08/2020   NA 141 11/08/2020   K 4.6 11/08/2020   CL 99 11/08/2020   CO2 30 (H) 11/08/2020   Lab Results  Component Value Date   ALT 10 11/08/2020   AST 21 11/08/2020   ALKPHOS 114 11/08/2020   BILITOT 0.7 11/08/2020   Lab Results  Component Value Date   HGBA1C 5.8 (H) 11/08/2020   HGBA1C 5.6 05/06/2020   HGBA1C 5.7 (H) 06/01/2017   Lab Results  Component Value Date   INSULIN 7.1 11/08/2020   INSULIN 9.1 05/06/2020   Lab Results  Component Value Date   TSH 1.570 05/06/2020   Lab Results  Component Value Date  CHOL 217 (H) 11/08/2020   HDL 65 11/08/2020   LDLCALC 143 (H) 11/08/2020   TRIG 53 11/08/2020   CHOLHDL 3.3 11/08/2020   Lab Results  Component Value Date   VD25OH 77.2 11/08/2020   VD25OH 115.0 (H) 05/06/2020   Lab Results  Component Value Date   WBC 4.8 05/06/2020   HGB 13.6 05/06/2020   HCT 42.5 05/06/2020   MCV 84 05/06/2020   PLT 209 05/06/2020   No results found for: IRON, TIBC,  FERRITIN  Attestation Statements:   Reviewed by clinician on day of visit: allergies, medications, problem list, medical history, surgical history, family history, social history, and previous encounter notes.   I, Burt Knack, am acting as transcriptionist for Quillian Quince, MD.  I have reviewed the above documentation for accuracy and completeness, and I agree with the above. -  Quillian Quince, MD

## 2020-12-31 ENCOUNTER — Other Ambulatory Visit (INDEPENDENT_AMBULATORY_CARE_PROVIDER_SITE_OTHER): Payer: Self-pay | Admitting: Physician Assistant

## 2020-12-31 DIAGNOSIS — I10 Essential (primary) hypertension: Secondary | ICD-10-CM

## 2021-01-18 ENCOUNTER — Ambulatory Visit (INDEPENDENT_AMBULATORY_CARE_PROVIDER_SITE_OTHER): Payer: 59 | Admitting: Family Medicine

## 2021-01-18 ENCOUNTER — Other Ambulatory Visit: Payer: Self-pay

## 2021-01-18 ENCOUNTER — Encounter (INDEPENDENT_AMBULATORY_CARE_PROVIDER_SITE_OTHER): Payer: Self-pay | Admitting: Family Medicine

## 2021-01-18 VITALS — BP 125/85 | HR 73 | Temp 98.6°F | Ht 68.0 in | Wt 260.0 lb

## 2021-01-18 DIAGNOSIS — I10 Essential (primary) hypertension: Secondary | ICD-10-CM | POA: Diagnosis not present

## 2021-01-18 DIAGNOSIS — Z9189 Other specified personal risk factors, not elsewhere classified: Secondary | ICD-10-CM

## 2021-01-18 DIAGNOSIS — Z6838 Body mass index (BMI) 38.0-38.9, adult: Secondary | ICD-10-CM

## 2021-01-18 DIAGNOSIS — F3289 Other specified depressive episodes: Secondary | ICD-10-CM | POA: Diagnosis not present

## 2021-01-18 DIAGNOSIS — F32A Depression, unspecified: Secondary | ICD-10-CM | POA: Insufficient documentation

## 2021-01-18 MED ORDER — BUPROPION HCL ER (SR) 150 MG PO TB12: 150.0000 mg | ORAL_TABLET | Freq: Every day | ORAL | 0 refills | Status: DC

## 2021-01-25 NOTE — Progress Notes (Signed)
Chief Complaint:   OBESITY Leslie Mason is here to discuss her progress with her obesity treatment plan along with follow-up of her obesity related diagnoses. Leslie Mason is on keeping a food journal and adhering to recommended goals of 1400-1600 calories and 95+ grams of protein daily and states she is following her eating plan approximately 90% of the time. Leslie Mason states she is at the gym doing cardio and weights for 90 minutes 2 times per week, and walking for 40 minutes 2 times per week.  Today's visit was #: 15 Starting weight: 254 lbs Starting date: 05/06/2020 Today's weight: 260 lbs Today's date: 01/18/2021 Total lbs lost to date: 0 Total lbs lost since last in-office visit: 0  Interim History: Leslie Mason has struggled with following her plan as closely, but she has done well with adding exercise. She is working on increasing her protein.  Subjective:   1. Essential hypertension Leslie Mason's blood pressure is well controlled on her medications. She is working on diet and weight loss to help decrease her blood pressure as well.  2. Emotional Eating Behaviors Leslie Mason notes increased emotional eating behaviors, and she is open to discussing medication options to help.   3. At risk for heart disease Leslie Mason is at a higher than average risk for cardiovascular disease due to obesity.   Assessment/Plan:   1. Essential hypertension Leslie Mason will continue diet and exercise to improve blood pressure control. We will continue to monitor as she continues her lifestyle modifications.  2. Emotional Eating Behaviors Behavior modification techniques were discussed today to help Leslie Mason deal with her emotional/non-hunger eating behaviors. Leslie Mason agreed to start Wellbutrin SR 150 mg q daily with no refills. Orders and follow up as documented in patient record.   - buPROPion (WELLBUTRIN SR) 150 MG 12 hr tablet; Take 1 tablet (150 mg total) by mouth daily.  Dispense: 30 tablet; Refill: 0  3. At risk  for heart disease Leslie Mason was given approximately 15 minutes of coronary artery disease prevention counseling today. She is 50 y.o. female and has risk factors for heart disease including obesity. We discussed intensive lifestyle modifications today with an emphasis on specific weight loss instructions and strategies.   Repetitive spaced learning was employed today to elicit superior memory formation and behavioral change.  4. Obesity with current BMI 39.6 Leslie Mason is currently in the action stage of change. As such, her goal is to continue with weight loss efforts. She has agreed to keeping a food journal and adhering to recommended goals of 1400-1600 calories and 95 grams of protein daily.   Exercise goals: As is.  Behavioral modification strategies: increasing lean protein intake.  Leslie Mason has agreed to follow-up with our clinic in 2 weeks. She was informed of the importance of frequent follow-up visits to maximize her success with intensive lifestyle modifications for her multiple health conditions.   Objective:   Blood pressure 125/85, pulse 73, temperature 98.6 F (37 C), height 5\' 8"  (1.727 m), weight 260 lb (117.9 kg), SpO2 100 %. Body mass index is 39.53 kg/m.  General: Cooperative, alert, well developed, in no acute distress. HEENT: Conjunctivae and lids unremarkable. Cardiovascular: Regular rhythm.  Lungs: Normal work of breathing. Neurologic: No focal deficits.   Lab Results  Component Value Date   CREATININE 0.94 11/08/2020   BUN 22 11/08/2020   NA 141 11/08/2020   K 4.6 11/08/2020   CL 99 11/08/2020   CO2 30 (H) 11/08/2020   Lab Results  Component Value Date   ALT  10 11/08/2020   AST 21 11/08/2020   ALKPHOS 114 11/08/2020   BILITOT 0.7 11/08/2020   Lab Results  Component Value Date   HGBA1C 5.8 (H) 11/08/2020   HGBA1C 5.6 05/06/2020   HGBA1C 5.7 (H) 06/01/2017   Lab Results  Component Value Date   INSULIN 7.1 11/08/2020   INSULIN 9.1 05/06/2020   Lab  Results  Component Value Date   TSH 1.570 05/06/2020   Lab Results  Component Value Date   CHOL 217 (H) 11/08/2020   HDL 65 11/08/2020   LDLCALC 143 (H) 11/08/2020   TRIG 53 11/08/2020   CHOLHDL 3.3 11/08/2020   Lab Results  Component Value Date   VD25OH 77.2 11/08/2020   VD25OH 115.0 (H) 05/06/2020   Lab Results  Component Value Date   WBC 4.8 05/06/2020   HGB 13.6 05/06/2020   HCT 42.5 05/06/2020   MCV 84 05/06/2020   PLT 209 05/06/2020   No results found for: IRON, TIBC, FERRITIN  Attestation Statements:   Reviewed by clinician on day of visit: allergies, medications, problem list, medical history, surgical history, family history, social history, and previous encounter notes.   I, Burt Knack, am acting as transcriptionist for Quillian Quince, MD.  I have reviewed the above documentation for accuracy and completeness, and I agree with the above. -  Quillian Quince, MD

## 2021-01-29 ENCOUNTER — Other Ambulatory Visit (INDEPENDENT_AMBULATORY_CARE_PROVIDER_SITE_OTHER): Payer: Self-pay | Admitting: Adult Health

## 2021-01-29 DIAGNOSIS — I10 Essential (primary) hypertension: Secondary | ICD-10-CM

## 2021-01-31 ENCOUNTER — Encounter (INDEPENDENT_AMBULATORY_CARE_PROVIDER_SITE_OTHER): Payer: Self-pay | Admitting: Emergency Medicine

## 2021-01-31 NOTE — Telephone Encounter (Signed)
.  mwm

## 2021-01-31 NOTE — Telephone Encounter (Signed)
Msg sent via mychart

## 2021-01-31 NOTE — Telephone Encounter (Signed)
Pt last seen by Dr. Beasley.  

## 2021-02-01 NOTE — Telephone Encounter (Signed)
Medication filled on 01/31/21

## 2021-02-10 ENCOUNTER — Other Ambulatory Visit (INDEPENDENT_AMBULATORY_CARE_PROVIDER_SITE_OTHER): Payer: Self-pay | Admitting: Family Medicine

## 2021-02-10 DIAGNOSIS — F3289 Other specified depressive episodes: Secondary | ICD-10-CM

## 2021-02-17 ENCOUNTER — Ambulatory Visit (INDEPENDENT_AMBULATORY_CARE_PROVIDER_SITE_OTHER): Payer: 59 | Admitting: Family Medicine

## 2021-02-17 ENCOUNTER — Other Ambulatory Visit: Payer: Self-pay

## 2021-02-17 ENCOUNTER — Encounter (INDEPENDENT_AMBULATORY_CARE_PROVIDER_SITE_OTHER): Payer: Self-pay | Admitting: Family Medicine

## 2021-02-17 VITALS — BP 122/79 | HR 65 | Temp 97.9°F | Ht 68.0 in | Wt 266.0 lb

## 2021-02-17 DIAGNOSIS — Z6838 Body mass index (BMI) 38.0-38.9, adult: Secondary | ICD-10-CM

## 2021-02-17 DIAGNOSIS — F3289 Other specified depressive episodes: Secondary | ICD-10-CM | POA: Diagnosis not present

## 2021-02-17 DIAGNOSIS — I10 Essential (primary) hypertension: Secondary | ICD-10-CM | POA: Diagnosis not present

## 2021-02-17 DIAGNOSIS — Z9189 Other specified personal risk factors, not elsewhere classified: Secondary | ICD-10-CM

## 2021-02-17 MED ORDER — BUPROPION HCL ER (SR) 150 MG PO TB12: 150.0000 mg | ORAL_TABLET | Freq: Every day | ORAL | 0 refills | Status: DC

## 2021-02-18 ENCOUNTER — Encounter (INDEPENDENT_AMBULATORY_CARE_PROVIDER_SITE_OTHER): Payer: Self-pay | Admitting: Family Medicine

## 2021-02-18 ENCOUNTER — Other Ambulatory Visit (HOSPITAL_COMMUNITY): Payer: Self-pay

## 2021-02-21 ENCOUNTER — Other Ambulatory Visit (INDEPENDENT_AMBULATORY_CARE_PROVIDER_SITE_OTHER): Payer: Self-pay | Admitting: Family Medicine

## 2021-02-21 MED ORDER — TIRZEPATIDE 2.5 MG/0.5ML ~~LOC~~ SOAJ: 2.5000 mg | SUBCUTANEOUS | 0 refills | Status: DC

## 2021-02-21 NOTE — Progress Notes (Signed)
Chief Complaint:   OBESITY Leslie Mason is here to discuss her progress with her obesity treatment plan along with follow-up of her obesity related diagnoses. Leslie Mason is on keeping a food journal and adhering to recommended goals of 1400-1600 calories and 95 grams of protein daily and states she is following her eating plan approximately 75% of the time. Leslie Mason states she is walking for 30 minutes 7 times per week.  Today's visit was #: 16 Starting weight: 254 lbs Starting date: 05/06/2020 Today's weight: 266 lbs Today's date: 02/17/2021 Total lbs lost to date: 0 Total lbs lost since last in-office visit: 0  Interim History: Leslie Mason continues to struggles with weight loss and she has gained 12 lbs since starting the program. She is open to looking at other medication options to help her with weight loss.  Subjective:   1. Essential hypertension Leslie Mason's blood pressure is well controlled on her medications, and she is still working on weight loss.  2. Emotional Eating Behaviors Leslie Mason is still struggling with emotional eating. No side effects were noted with Wellbutrin.  3. At risk for diabetes mellitus Leslie Mason is at higher than average risk for developing diabetes due to obesity.   Assessment/Plan:   1. Essential hypertension Leslie Mason will continue her medications, diet, and exercise to improve blood pressure control. We will watch for signs of hypotension as she continues her lifestyle modifications.  2. Emotional Eating Behaviors Behavior modification techniques were discussed today to help Leslie Mason deal with her emotional/non-hunger eating behaviors. We will refill Wellbutrin SR for 1 month. Orders and follow up as documented in patient record.   - buPROPion (WELLBUTRIN SR) 150 MG 12 hr tablet; Take 1 tablet (150 mg total) by mouth daily.  Dispense: 30 tablet; Refill: 0  3. At risk for diabetes mellitus Leslie Mason was given approximately 15 minutes of diabetes education and  counseling today. We discussed intensive lifestyle modifications today with an emphasis on weight loss as well as increasing exercise and decreasing simple carbohydrates in her diet. We also reviewed medication options with an emphasis on risk versus benefit of those discussed.   Repetitive spaced learning was employed today to elicit superior memory formation and behavioral change.  4. Obesity with current BMI40.5 Leslie Mason is currently in the action stage of change. As such, her goal is to continue with weight loss efforts. She has agreed to keeping a food journal and adhering to recommended goals of 1400-1600 calories and 95+ grams of protein daily.   We discussed various medication options to help Leslie Mason with her weight loss efforts and we both agreed to start Leslie Mason 2.5 mg q weekly with no refills.  - tirzepatide Leslie Mason) 2.5 MG/0.5ML Pen; Inject 2.5 mg into the skin once a week.  Dispense: 2 mL; Refill: 0  Exercise goals: As is.  Behavioral modification strategies: increasing lean protein intake.  Leslie Mason has agreed to follow-up with our clinic in 2 weeks. She was informed of the importance of frequent follow-up visits to maximize her success with intensive lifestyle modifications for her multiple health conditions.   Objective:   Blood pressure 122/79, pulse 65, temperature 97.9 F (36.6 C), height 5\' 8"  (1.727 m), weight 266 lb (120.7 kg), SpO2 97 %. Body mass index is 40.45 kg/m.  General: Cooperative, alert, well developed, in no acute distress. HEENT: Conjunctivae and lids unremarkable. Cardiovascular: Regular rhythm.  Lungs: Normal work of breathing. Neurologic: No focal deficits.   Lab Results  Component Value Date   CREATININE 0.94 11/08/2020  BUN 22 11/08/2020   NA 141 11/08/2020   K 4.6 11/08/2020   CL 99 11/08/2020   CO2 30 (H) 11/08/2020   Lab Results  Component Value Date   ALT 10 11/08/2020   AST 21 11/08/2020   ALKPHOS 114 11/08/2020   BILITOT 0.7  11/08/2020   Lab Results  Component Value Date   HGBA1C 5.8 (H) 11/08/2020   HGBA1C 5.6 05/06/2020   HGBA1C 5.7 (H) 06/01/2017   Lab Results  Component Value Date   INSULIN 7.1 11/08/2020   INSULIN 9.1 05/06/2020   Lab Results  Component Value Date   TSH 1.570 05/06/2020   Lab Results  Component Value Date   CHOL 217 (H) 11/08/2020   HDL 65 11/08/2020   LDLCALC 143 (H) 11/08/2020   TRIG 53 11/08/2020   CHOLHDL 3.3 11/08/2020   Lab Results  Component Value Date   VD25OH 77.2 11/08/2020   VD25OH 115.0 (H) 05/06/2020   Lab Results  Component Value Date   WBC 4.8 05/06/2020   HGB 13.6 05/06/2020   HCT 42.5 05/06/2020   MCV 84 05/06/2020   PLT 209 05/06/2020   No results found for: IRON, TIBC, FERRITIN  Attestation Statements:   Reviewed by clinician on day of visit: allergies, medications, problem list, medical history, surgical history, family history, social history, and previous encounter notes.   I, Burt Knack, am acting as transcriptionist for Quillian Quince, MD.  I have reviewed the above documentation for accuracy and completeness, and I agree with the above. -  Quillian Quince, MD

## 2021-02-21 NOTE — Telephone Encounter (Signed)
Pt last seen by Dr. Beasley.  

## 2021-02-22 ENCOUNTER — Other Ambulatory Visit (INDEPENDENT_AMBULATORY_CARE_PROVIDER_SITE_OTHER): Payer: Self-pay | Admitting: Emergency Medicine

## 2021-02-22 ENCOUNTER — Other Ambulatory Visit (HOSPITAL_COMMUNITY): Payer: Self-pay

## 2021-02-22 DIAGNOSIS — Z6838 Body mass index (BMI) 38.0-38.9, adult: Secondary | ICD-10-CM

## 2021-02-22 MED ORDER — TIRZEPATIDE 2.5 MG/0.5ML ~~LOC~~ SOAJ
2.5000 mg | SUBCUTANEOUS | 0 refills | Status: DC
  Filled 2021-02-22: qty 2, 28d supply, fill #0

## 2021-02-25 ENCOUNTER — Other Ambulatory Visit (HOSPITAL_COMMUNITY): Payer: Self-pay

## 2021-03-03 ENCOUNTER — Ambulatory Visit (INDEPENDENT_AMBULATORY_CARE_PROVIDER_SITE_OTHER): Payer: 59 | Admitting: Family Medicine

## 2021-03-03 ENCOUNTER — Encounter (INDEPENDENT_AMBULATORY_CARE_PROVIDER_SITE_OTHER): Payer: Self-pay | Admitting: Family Medicine

## 2021-03-03 ENCOUNTER — Other Ambulatory Visit: Payer: Self-pay

## 2021-03-03 VITALS — BP 136/82 | HR 57 | Temp 97.7°F | Ht 68.0 in | Wt 264.0 lb

## 2021-03-03 DIAGNOSIS — R7303 Prediabetes: Secondary | ICD-10-CM | POA: Diagnosis not present

## 2021-03-03 DIAGNOSIS — I10 Essential (primary) hypertension: Secondary | ICD-10-CM

## 2021-03-03 DIAGNOSIS — F3289 Other specified depressive episodes: Secondary | ICD-10-CM | POA: Diagnosis not present

## 2021-03-03 DIAGNOSIS — Z9189 Other specified personal risk factors, not elsewhere classified: Secondary | ICD-10-CM | POA: Diagnosis not present

## 2021-03-03 DIAGNOSIS — Z6838 Body mass index (BMI) 38.0-38.9, adult: Secondary | ICD-10-CM

## 2021-03-03 MED ORDER — CHLORTHALIDONE 25 MG PO TABS: 25.0000 mg | ORAL_TABLET | Freq: Every day | ORAL | 0 refills | Status: DC

## 2021-03-03 MED ORDER — BUPROPION HCL ER (SR) 150 MG PO TB12: 150.0000 mg | ORAL_TABLET | Freq: Every day | ORAL | 0 refills | Status: DC

## 2021-03-03 NOTE — Progress Notes (Signed)
Chief Complaint:   OBESITY Leslie Mason is here to discuss her progress with her obesity treatment plan along with follow-up of her obesity related diagnoses. Leslie Mason is on keeping a food journal and adhering to recommended goals of 1400-1600 calories and 95+ grams of protein daily and states she is following her eating plan approximately 80% of the time. Leslie Mason states she is at the gym walking for 90 minutes 5 times per week.  Today's visit was #: 17 Starting weight: 254 lbs Starting date: 05/06/2020 Today's weight: 264 lbs Today's date: 03/03/2021 Total lbs lost to date: 0 Total lbs lost since last in-office visit: 2  Interim History: Leslie Mason continues to work on weight loss. She notes her hunger is controlled but work is going to more hectic, and meal planning will be more challenging.  Subjective:   1. Essential hypertension Leslie Mason's blood pressure is stable on her medications. She is doing well with diet and weight loss.  2. Pre-diabetes Leslie Mason started [place] this week and she is not having any side effects. She notes decreased hunger.  3. Other depression with emotional eating Leslie Mason is stable on her medications, and she is working on diet and exercise. She requests a refill today.  4. At risk for diabetes mellitus Leslie Mason is at higher than average risk for developing diabetes due to obesity.   Assessment/Plan:   1. Essential hypertension Shondrika will continue working on healthy weight loss and exercise to improve blood pressure control. We will refill chlorthalidone 25 mg q daily #30 for 1 month. She will watch for signs of hypotension as she continues her lifestyle modifications.  2. Pre-diabetes Treana will continue Mounjaro at 2.5 mg for another 3 weeks, and may increase her dose at that time. She will continue to work on weight loss, exercise, and decreasing simple carbohydrates to help decrease the risk of diabetes.   3. Other depression with emotional  eating Behavior modification techniques were discussed today to help Leslie Mason deal with her emotional/non-hunger eating behaviors. We will refill Wellbutrin SR 150 mg q daily #30 for 1 month. Orders and follow up as documented in patient record.   4. At risk for diabetes mellitus Leslie Mason was given approximately 15 minutes of diabetes education and counseling today. We discussed intensive lifestyle modifications today with an emphasis on weight loss as well as increasing exercise and decreasing simple carbohydrates in her diet. We also reviewed medication options with an emphasis on risk versus benefit of those discussed.   Repetitive spaced learning was employed today to elicit superior memory formation and behavioral change.  5. Obesity with current BMI 40.3 Leslie Mason is currently in the action stage of change. As such, her goal is to continue with weight loss efforts. She has agreed to keeping a food journal and adhering to recommended goals of 1400-1600 calories and 95+ grams of protein daily.   Exercise goals: As is.  Behavioral modification strategies: increasing lean protein intake and no skipping meals.  Leslie Mason has agreed to follow-up with our clinic in 3 weeks. She was informed of the importance of frequent follow-up visits to maximize her success with intensive lifestyle modifications for her multiple health conditions.   Objective:   Blood pressure 136/82, pulse (!) 57, temperature 97.7 F (36.5 C), height 5\' 8"  (1.727 m), weight 264 lb (119.7 kg), SpO2 98 %. Body mass index is 40.14 kg/m.  General: Cooperative, alert, well developed, in no acute distress. HEENT: Conjunctivae and lids unremarkable. Cardiovascular: Regular rhythm.  Lungs: Normal  work of breathing. Neurologic: No focal deficits.   Lab Results  Component Value Date   CREATININE 0.94 11/08/2020   BUN 22 11/08/2020   NA 141 11/08/2020   K 4.6 11/08/2020   CL 99 11/08/2020   CO2 30 (H) 11/08/2020   Lab  Results  Component Value Date   ALT 10 11/08/2020   AST 21 11/08/2020   ALKPHOS 114 11/08/2020   BILITOT 0.7 11/08/2020   Lab Results  Component Value Date   HGBA1C 5.8 (H) 11/08/2020   HGBA1C 5.6 05/06/2020   HGBA1C 5.7 (H) 06/01/2017   Lab Results  Component Value Date   INSULIN 7.1 11/08/2020   INSULIN 9.1 05/06/2020   Lab Results  Component Value Date   TSH 1.570 05/06/2020   Lab Results  Component Value Date   CHOL 217 (H) 11/08/2020   HDL 65 11/08/2020   LDLCALC 143 (H) 11/08/2020   TRIG 53 11/08/2020   CHOLHDL 3.3 11/08/2020   Lab Results  Component Value Date   VD25OH 77.2 11/08/2020   VD25OH 115.0 (H) 05/06/2020   Lab Results  Component Value Date   WBC 4.8 05/06/2020   HGB 13.6 05/06/2020   HCT 42.5 05/06/2020   MCV 84 05/06/2020   PLT 209 05/06/2020   No results found for: IRON, TIBC, FERRITIN  Attestation Statements:   Reviewed by clinician on day of visit: allergies, medications, problem list, medical history, surgical history, family history, social history, and previous encounter notes.   I, Burt Knack, am acting as transcriptionist for Quillian Quince, MD.  I have reviewed the above documentation for accuracy and completeness, and I agree with the above. -  Quillian Quince, MD

## 2021-03-18 ENCOUNTER — Other Ambulatory Visit (INDEPENDENT_AMBULATORY_CARE_PROVIDER_SITE_OTHER): Payer: Self-pay | Admitting: Family Medicine

## 2021-03-18 DIAGNOSIS — F3289 Other specified depressive episodes: Secondary | ICD-10-CM

## 2021-03-21 ENCOUNTER — Encounter (INDEPENDENT_AMBULATORY_CARE_PROVIDER_SITE_OTHER): Payer: Self-pay | Admitting: Family Medicine

## 2021-03-21 NOTE — Telephone Encounter (Signed)
Last OV with Dr. Beasley 

## 2021-03-21 NOTE — Telephone Encounter (Signed)
//  LAST APPOINTMENT DATE: 03/03/21 NEXT APPOINTMENT DATE: 03/24/21   [id number] - [place], [place] - [address]  [address] Phone: [phone] Fax: [phone]  [facility] [address] [place] [address] Phone: [phone] Fax: [phone]  Patient is requesting a refill of the following medications: Requested Prescriptions   Pending Prescriptions Disp Refills   buPROPion (WELLBUTRIN SR) 150 MG 12 hr tablet 30 tablet 0    Sig: Take 1 tablet (150 mg total) by mouth daily.    Date last filled: 03/03/21 Previously prescribed by [facility]  Lab Results  Component Value Date   HGBA1C 5.8 (H) 11/08/2020   HGBA1C 5.6 05/06/2020   HGBA1C 5.7 (H) 06/01/2017   Lab Results  Component Value Date   LDLCALC 143 (H) 11/08/2020   CREATININE 0.94 11/08/2020   Lab Results  Component Value Date   VD25OH 77.2 11/08/2020   VD25OH 115.0 (H) 05/06/2020    BP Readings from Last 3 Encounters:  03/03/21 136/82  02/17/21 122/79  01/18/21 125/85

## 2021-03-24 ENCOUNTER — Ambulatory Visit (INDEPENDENT_AMBULATORY_CARE_PROVIDER_SITE_OTHER): Payer: 59 | Admitting: Family Medicine

## 2021-03-24 ENCOUNTER — Encounter (INDEPENDENT_AMBULATORY_CARE_PROVIDER_SITE_OTHER): Payer: Self-pay | Admitting: Family Medicine

## 2021-03-24 ENCOUNTER — Other Ambulatory Visit: Payer: Self-pay

## 2021-03-24 ENCOUNTER — Other Ambulatory Visit (HOSPITAL_COMMUNITY): Payer: Self-pay

## 2021-03-24 VITALS — BP 121/88 | HR 60 | Temp 98.0°F | Ht 68.0 in | Wt 260.0 lb

## 2021-03-24 DIAGNOSIS — Z9189 Other specified personal risk factors, not elsewhere classified: Secondary | ICD-10-CM | POA: Diagnosis not present

## 2021-03-24 DIAGNOSIS — I10 Essential (primary) hypertension: Secondary | ICD-10-CM

## 2021-03-24 DIAGNOSIS — R7303 Prediabetes: Secondary | ICD-10-CM

## 2021-03-24 DIAGNOSIS — F3289 Other specified depressive episodes: Secondary | ICD-10-CM

## 2021-03-24 DIAGNOSIS — Z6838 Body mass index (BMI) 38.0-38.9, adult: Secondary | ICD-10-CM

## 2021-03-24 MED ORDER — BUPROPION HCL ER (SR) 200 MG PO TB12: 200.0000 mg | ORAL_TABLET | Freq: Every day | ORAL | 0 refills | Status: DC

## 2021-03-24 MED ORDER — METOPROLOL SUCCINATE ER 25 MG PO TB24: 25.0000 mg | ORAL_TABLET | Freq: Every day | ORAL | 0 refills | Status: DC

## 2021-03-24 MED ORDER — CHLORTHALIDONE 25 MG PO TABS: 25.0000 mg | ORAL_TABLET | Freq: Every day | ORAL | 0 refills | Status: DC

## 2021-03-24 MED ORDER — TIRZEPATIDE 2.5 MG/0.5ML ~~LOC~~ SOAJ
2.5000 mg | SUBCUTANEOUS | 0 refills | Status: DC
  Filled 2021-03-24: qty 2, 28d supply, fill #0

## 2021-03-24 NOTE — Progress Notes (Signed)
Chief Complaint:   OBESITY Leslie Mason is here to discuss her progress with her obesity treatment plan along with follow-up of her obesity related diagnoses. Leslie Mason is on keeping a food journal and adhering to recommended goals of 1400-1600 calories and 95+ grams of protein daily and states she is following her eating plan approximately 90% of the time. Leslie Mason states she is walking for 130 minutes 7 times per week.  Today's visit was #: 18 Starting weight: 254 lbs Starting date: 05/06/2020 Today's weight: 260 lbs Today's date: 03/24/2021 Total lbs lost to date: 0 Total lbs lost since last in-office visit: 4  Interim History: Marenda has done well with weight loss, and she notes decreased hunger on her GLP-1. She is sometimes skipping meals which could eventually be a problem.  Subjective:   1. Essential hypertension Leslie Mason's blood pressure is well controlled on her medications and with diet.  2. Pre-diabetes Leslie Mason is stable on Mounjaro, and she is working on diet and weight loss. She struggled to eat all of her food at time.  3. Other depression with emotional eating Leslie Mason is stable on her medications, but she has increased stress especially at work and she notes decreased sleep quality.   4. At risk for impaired metabolic function Leslie Mason is at increased risk for impaired metabolic function if calorie or protein drops too low.  Assessment/Plan:   1. Essential hypertension Leslie Mason will continue working on healthy weight loss and exercise to improve blood pressure control. We will refill chlorthalidone for 1 month and metoprolol for 90 days with no refills, and will continue to follow up as she continues her lifestyle modifications.  - chlorthalidone (HYGROTON) 25 MG tablet; Take 1 tablet (25 mg total) by mouth daily.  Dispense: 30 tablet; Refill: 0 - metoprolol succinate (TOPROL-XL) 25 MG 24 hr tablet; Take 1 tablet (25 mg total) by mouth daily.  Dispense: 90 tablet;  Refill: 0  2. Pre-diabetes Leslie Mason will continue to work on weight loss, exercise, and decreasing simple carbohydrates to help decrease the risk of diabetes. We will refill Mounjaro 2.5 mg for 1 month.  - tirzepatide [name]) 2.5 MG/0.5ML Pen; Inject 2.5 mg into the skin once a week.  Dispense: 2 mL; Refill: 0  3. Other depression with emotional eating Behavior modification techniques were discussed today to help Franchesca deal with her emotional/non-hunger eating behaviors. Leslie Mason agreed to increase Wellbutrin SR to 200 mg q AM with no refills. Orders and follow up as documented in patient record.   - buPROPion (WELLBUTRIN SR) 200 MG 12 hr tablet; Take 1 tablet (200 mg total) by mouth daily.  Dispense: 30 tablet; Refill: 0  4. At risk for impaired metabolic function Leslie Mason was given approximately 15 minutes of impaired  metabolic function prevention counseling today. We discussed intensive lifestyle modifications today with an emphasis on specific nutrition and exercise instructions and strategies.   Repetitive spaced learning was employed today to elicit superior memory formation and behavioral change.  5. Obesity with current BMI of 39.5 Leslie Mason is currently in the action stage of change. As such, her goal is to continue with weight loss efforts. She has agreed to the Category 2 Plan or keeping a food journal and adhering to recommended goals of 1400 calories and 95+ grams of protein daily.   Exercise goals: As is.  Behavioral modification strategies: no skipping meals and keeping a strict food journal.  Leslie Mason has agreed to follow-up with our clinic in 2 to 3 weeks. She  was informed of the importance of frequent follow-up visits to maximize her success with intensive lifestyle modifications for her multiple health conditions.   Objective:   Blood pressure 121/88, pulse 60, temperature 98 F (36.7 C), height 5\' 8"  (1.727 m), weight 260 lb (117.9 kg), SpO2 99 %. Body mass index is  39.53 kg/m.  General: Cooperative, alert, well developed, in no acute distress. HEENT: Conjunctivae and lids unremarkable. Cardiovascular: Regular rhythm.  Lungs: Normal work of breathing. Neurologic: No focal deficits.   Lab Results  Component Value Date   CREATININE 0.94 11/08/2020   BUN 22 11/08/2020   NA 141 11/08/2020   K 4.6 11/08/2020   CL 99 11/08/2020   CO2 30 (H) 11/08/2020   Lab Results  Component Value Date   ALT 10 11/08/2020   AST 21 11/08/2020   ALKPHOS 114 11/08/2020   BILITOT 0.7 11/08/2020   Lab Results  Component Value Date   HGBA1C 5.8 (H) 11/08/2020   HGBA1C 5.6 05/06/2020   HGBA1C 5.7 (H) 06/01/2017   Lab Results  Component Value Date   INSULIN 7.1 11/08/2020   INSULIN 9.1 05/06/2020   Lab Results  Component Value Date   TSH 1.570 05/06/2020   Lab Results  Component Value Date   CHOL 217 (H) 11/08/2020   HDL 65 11/08/2020   LDLCALC 143 (H) 11/08/2020   TRIG 53 11/08/2020   CHOLHDL 3.3 11/08/2020   Lab Results  Component Value Date   VD25OH 77.2 11/08/2020   VD25OH 115.0 (H) 05/06/2020   Lab Results  Component Value Date   WBC 4.8 05/06/2020   HGB 13.6 05/06/2020   HCT 42.5 05/06/2020   MCV 84 05/06/2020   PLT 209 05/06/2020   No results found for: IRON, TIBC, FERRITIN  Attestation Statements:   Reviewed by clinician on day of visit: allergies, medications, problem list, medical history, surgical history, family history, social history, and previous encounter notes.   I, 13/10/2019, am acting as transcriptionist for Leslie Knack, MD.  I have reviewed the above documentation for accuracy and completeness, and I agree with the above. -  Quillian Quince, MD

## 2021-04-03 ENCOUNTER — Other Ambulatory Visit (INDEPENDENT_AMBULATORY_CARE_PROVIDER_SITE_OTHER): Payer: Self-pay | Admitting: Family Medicine

## 2021-04-03 DIAGNOSIS — I10 Essential (primary) hypertension: Secondary | ICD-10-CM

## 2021-04-04 NOTE — Telephone Encounter (Signed)
Pt last seen by Dr. Beasley.  

## 2021-04-14 ENCOUNTER — Encounter (INDEPENDENT_AMBULATORY_CARE_PROVIDER_SITE_OTHER): Payer: Self-pay | Admitting: Family Medicine

## 2021-04-14 ENCOUNTER — Ambulatory Visit (INDEPENDENT_AMBULATORY_CARE_PROVIDER_SITE_OTHER): Payer: 59 | Admitting: Family Medicine

## 2021-04-14 ENCOUNTER — Other Ambulatory Visit: Payer: Self-pay

## 2021-04-14 VITALS — BP 116/77 | HR 65 | Temp 98.1°F | Ht 68.0 in | Wt 257.0 lb

## 2021-04-14 DIAGNOSIS — R7303 Prediabetes: Secondary | ICD-10-CM | POA: Diagnosis not present

## 2021-04-14 DIAGNOSIS — E7849 Other hyperlipidemia: Secondary | ICD-10-CM

## 2021-04-14 DIAGNOSIS — Z6838 Body mass index (BMI) 38.0-38.9, adult: Secondary | ICD-10-CM

## 2021-04-14 DIAGNOSIS — Z9189 Other specified personal risk factors, not elsewhere classified: Secondary | ICD-10-CM

## 2021-04-14 DIAGNOSIS — E559 Vitamin D deficiency, unspecified: Secondary | ICD-10-CM

## 2021-04-14 DIAGNOSIS — F3289 Other specified depressive episodes: Secondary | ICD-10-CM

## 2021-04-14 MED ORDER — TIRZEPATIDE 2.5 MG/0.5ML ~~LOC~~ SOAJ
2.5000 mg | SUBCUTANEOUS | 0 refills | Status: DC
  Filled 2021-04-21: qty 2, 28d supply, fill #0

## 2021-04-14 MED ORDER — BUPROPION HCL ER (SR) 200 MG PO TB12: 200.0000 mg | ORAL_TABLET | Freq: Every day | ORAL | 0 refills | Status: DC

## 2021-04-14 NOTE — Progress Notes (Signed)
Chief Complaint:   OBESITY Leslie Mason is here to discuss her progress with her obesity treatment plan along with follow-up of her obesity related diagnoses. Leslie Mason is on the Category 2 Plan or keeping a food journal and adhering to recommended goals of 1400 calories and 95+ grams of protein daily and states she is following her eating plan approximately 85% of the time. Leslie Mason states she is walking for 30 minutes 7 times per week.  Today's visit was #: 33 Starting weight: 254 lbs Starting date: 05/06/2020 Today's weight: 257 lbs Today's date: 04/14/2021 Total lbs lost to date: 0 Total lbs lost since last in-office visit: 3  Interim History: Leslie Mason continues to do well with weight loss. She is doing well meeting her calorie and protein goals. Her hunger is controlled and she is meal planning well.  Subjective:   1. Pre-diabetes Leslie Mason is stable on Mounjaro, and she is doing well with diet and exercise.'  2. Vitamin D deficiency Leslie Mason is stable and she is due for labs. She denies nausea, vomiting, or muscle weakness. She has been over-replaced previously.  3. Other hyperlipidemia Leslie Mason is not on statin, and she is doing well with diet and exercise. She is due for labs.  4. Other depression with emotional eating Leslie Mason's mood has improved with increased Wellbutrin, and she is dealing with work stress better.  5. At risk for constipation Leslie Mason is at increased risk for constipation due to [facility] and weight loss.  Assessment/Plan:   1. Pre-diabetes Leslie Mason will continue to work on weight loss, exercise, and decreasing simple carbohydrates to help decrease the risk of diabetes. We will check labs today, and we will refill Mounjaro for 1 month.  - Insulin, random - Hemoglobin A1c - tirzepatide (MOUNJARO) 2.5 MG/0.5ML Pen; Inject 2.5 mg into the skin once a week.  Dispense: 2 mL; Refill: 0  2. Vitamin D deficiency Low Vitamin D level contributes to fatigue and are  associated with obesity, breast, and colon cancer. We will check labs today. Leslie Mason will follow-up for routine testing of Vitamin D, at least 2-3 times per year to avoid over-replacement.  - VITAMIN D 25 Hydroxy (Vit-D Deficiency, Fractures)  3. Other hyperlipidemia Cardiovascular risk and specific lipid/LDL goals reviewed. We discussed several lifestyle modifications today. We will check labs today. Leslie Mason will continue to work on diet, exercise and weight loss efforts. Orders and follow up as documented in patient record.   - TSH - Lipid Panel With LDL/HDL Ratio - CMP14+EGFR  4. Other depression with emotional eating Behavior modification techniques were discussed today to help Leslie Mason deal with her emotional/non-hunger eating behaviors. We will refill Wellbutrin SR for 1 month. Orders and follow up as documented in patient record.   - buPROPion (WELLBUTRIN SR) 200 MG 12 hr tablet; Take 1 tablet (200 mg total) by mouth daily.  Dispense: 30 tablet; Refill: 0  5. At risk for constipation Leslie Mason was given approximately 15 minutes of counseling today regarding prevention of constipation. She was encouraged to increase water and fiber intake.   6. Obesity with current BMI of 39.1 Leslie Mason is currently in the action stage of change. As such, her goal is to continue with weight loss efforts. She has agreed to keeping a food journal and adhering to recommended goals of 1400 calories and 95+ grams of protein daily.   Exercise goals: As is.  Behavioral modification strategies: increasing lean protein intake.  Leslie Mason has agreed to follow-up with our clinic in 3 weeks.  She was informed of the importance of frequent follow-up visits to maximize her success with intensive lifestyle modifications for her multiple health conditions.   Leslie Mason was informed we would discuss her lab results at her next visit unless there is a critical issue that needs to be addressed sooner. Leslie Mason agreed to keep her  next visit at the agreed upon time to discuss these results.  Objective:   Blood pressure 116/77, pulse 65, temperature 98.1 F (36.7 C), height 5' 8"  (1.727 m), weight 257 lb (116.6 kg), SpO2 97 %. Body mass index is 39.08 kg/m.  General: Cooperative, alert, well developed, in no acute distress. HEENT: Conjunctivae and lids unremarkable. Cardiovascular: Regular rhythm.  Lungs: Normal work of breathing. Neurologic: No focal deficits.   Lab Results  Component Value Date   CREATININE 0.94 11/08/2020   BUN 22 11/08/2020   NA 141 11/08/2020   K 4.6 11/08/2020   CL 99 11/08/2020   CO2 30 (H) 11/08/2020   Lab Results  Component Value Date   ALT 10 11/08/2020   AST 21 11/08/2020   ALKPHOS 114 11/08/2020   BILITOT 0.7 11/08/2020   Lab Results  Component Value Date   HGBA1C 5.8 (H) 11/08/2020   HGBA1C 5.6 05/06/2020   HGBA1C 5.7 (H) 06/01/2017   Lab Results  Component Value Date   INSULIN 7.1 11/08/2020   INSULIN 9.1 05/06/2020   Lab Results  Component Value Date   TSH 1.570 05/06/2020   Lab Results  Component Value Date   CHOL 217 (H) 11/08/2020   HDL 65 11/08/2020   LDLCALC 143 (H) 11/08/2020   TRIG 53 11/08/2020   CHOLHDL 3.3 11/08/2020   Lab Results  Component Value Date   VD25OH 77.2 11/08/2020   VD25OH 115.0 (H) 05/06/2020   Lab Results  Component Value Date   WBC 4.8 05/06/2020   HGB 13.6 05/06/2020   HCT 42.5 05/06/2020   MCV 84 05/06/2020   PLT 209 05/06/2020   No results found for: IRON, TIBC, FERRITIN  Attestation Statements:   Reviewed by clinician on day of visit: allergies, medications, problem list, medical history, surgical history, family history, social history, and previous encounter notes.   I, Trixie Dredge, am acting as transcriptionist for Dennard Nip, MD.  I have reviewed the above documentation for accuracy and completeness, and I agree with the above. -  Dennard Nip, MD

## 2021-04-15 LAB — CMP14+EGFR
ALT: 12 IU/L (ref 0–32)
AST: 17 IU/L (ref 0–40)
Albumin/Globulin Ratio: 1.3 (ref 1.2–2.2)
Albumin: 4.2 g/dL (ref 3.8–4.9)
Alkaline Phosphatase: 103 IU/L (ref 44–121)
BUN/Creatinine Ratio: 18 (ref 9–23)
BUN: 19 mg/dL (ref 6–24)
Bilirubin Total: 0.5 mg/dL (ref 0.0–1.2)
CO2: 28 mmol/L (ref 20–29)
Calcium: 10.6 mg/dL — ABNORMAL HIGH (ref 8.7–10.2)
Chloride: 98 mmol/L (ref 96–106)
Creatinine, Ser: 1.05 mg/dL — ABNORMAL HIGH (ref 0.57–1.00)
Globulin, Total: 3.2 g/dL (ref 1.5–4.5)
Glucose: 78 mg/dL (ref 70–99)
Potassium: 4.3 mmol/L (ref 3.5–5.2)
Sodium: 142 mmol/L (ref 134–144)
Total Protein: 7.4 g/dL (ref 6.0–8.5)
eGFR: 64 mL/min/{1.73_m2} (ref >59)

## 2021-04-15 LAB — HEMOGLOBIN A1C
Est. average glucose Bld gHb Est-mCnc: 117 mg/dL
Hgb A1c MFr Bld: 5.7 % — ABNORMAL HIGH (ref 4.8–5.6)

## 2021-04-15 LAB — INSULIN, RANDOM: INSULIN: 12.4 u[IU]/mL (ref 2.6–24.9)

## 2021-04-15 LAB — VITAMIN D 25 HYDROXY (VIT D DEFICIENCY, FRACTURES): Vit D, 25-Hydroxy: 106 ng/mL — ABNORMAL HIGH (ref 30.0–100.0)

## 2021-04-15 LAB — TSH: TSH: 2.04 u[IU]/mL (ref 0.450–4.500)

## 2021-04-15 LAB — LIPID PANEL WITH LDL/HDL RATIO
Cholesterol, Total: 195 mg/dL (ref 100–199)
HDL: 59 mg/dL (ref >39)
LDL Chol Calc (NIH): 125 mg/dL — ABNORMAL HIGH (ref 0–99)
LDL/HDL Ratio: 2.1 ratio (ref 0.0–3.2)
Triglycerides: 59 mg/dL (ref 0–149)
VLDL Cholesterol Cal: 11 mg/dL (ref 5–40)

## 2021-04-18 ENCOUNTER — Other Ambulatory Visit (INDEPENDENT_AMBULATORY_CARE_PROVIDER_SITE_OTHER): Payer: Self-pay | Admitting: Family Medicine

## 2021-04-18 ENCOUNTER — Other Ambulatory Visit (HOSPITAL_COMMUNITY): Payer: Self-pay

## 2021-04-18 DIAGNOSIS — R7303 Prediabetes: Secondary | ICD-10-CM

## 2021-04-18 NOTE — Telephone Encounter (Signed)
Please see message and advise.  Thank you. Mounjaro sent to wrong pharmacy last week.  I have it teed up to go to correct pharmacy.

## 2021-04-18 NOTE — Telephone Encounter (Signed)
Dr.Beasley 

## 2021-04-18 NOTE — Progress Notes (Signed)
Vit D is overreplaced again, please stop all vitamin D and we will follow up at your next visit.

## 2021-04-19 NOTE — Telephone Encounter (Signed)
Ok to send to [facility] x 1

## 2021-04-21 ENCOUNTER — Other Ambulatory Visit (INDEPENDENT_AMBULATORY_CARE_PROVIDER_SITE_OTHER): Payer: Self-pay | Admitting: Family Medicine

## 2021-04-21 ENCOUNTER — Other Ambulatory Visit (HOSPITAL_COMMUNITY): Payer: Self-pay

## 2021-04-21 DIAGNOSIS — R7303 Prediabetes: Secondary | ICD-10-CM

## 2021-05-03 ENCOUNTER — Other Ambulatory Visit (INDEPENDENT_AMBULATORY_CARE_PROVIDER_SITE_OTHER): Payer: Self-pay | Admitting: Family Medicine

## 2021-05-03 DIAGNOSIS — I10 Essential (primary) hypertension: Secondary | ICD-10-CM

## 2021-05-03 NOTE — Telephone Encounter (Signed)
LAST APPOINTMENT DATE: 04/14/21 NEXT APPOINTMENT DATE: 05/05/21   [id number] - [place], [place] - [address]  [address] Phone: [phone] Fax: [phone]  [facility] [address] [place] [address] Phone: [phone] Fax: [phone]  Patient is requesting a refill of the following medications: Requested Prescriptions   Pending Prescriptions Disp Refills   chlorthalidone (HYGROTON) 25 MG tablet [Pharmacy Med Name: CHLORTHALIDONE 25 MG TABLET] 30 tablet 0    Sig: TAKE 1 TABLET (25 MG TOTAL) BY MOUTH DAILY.    Date last filled: 03/24/21 Previously prescribed by Dr. Dalbert Garnet  Lab Results  Component Value Date   HGBA1C 5.7 (H) 04/14/2021   HGBA1C 5.8 (H) 11/08/2020   HGBA1C 5.6 05/06/2020   Lab Results  Component Value Date   LDLCALC 125 (H) 04/14/2021   CREATININE 1.05 (H) 04/14/2021   Lab Results  Component Value Date   VD25OH 106.0 (H) 04/14/2021   VD25OH 77.2 11/08/2020   VD25OH 115.0 (H) 05/06/2020    BP Readings from Last 3 Encounters:  04/14/21 116/77  03/24/21 121/88  03/03/21 136/82

## 2021-05-03 NOTE — Telephone Encounter (Signed)
Dr.Beasley 

## 2021-05-05 ENCOUNTER — Encounter (INDEPENDENT_AMBULATORY_CARE_PROVIDER_SITE_OTHER): Payer: Self-pay | Admitting: Family Medicine

## 2021-05-05 ENCOUNTER — Other Ambulatory Visit: Payer: Self-pay

## 2021-05-05 ENCOUNTER — Other Ambulatory Visit (HOSPITAL_COMMUNITY): Payer: Self-pay

## 2021-05-05 ENCOUNTER — Ambulatory Visit (INDEPENDENT_AMBULATORY_CARE_PROVIDER_SITE_OTHER): Payer: 59 | Admitting: Family Medicine

## 2021-05-05 VITALS — BP 105/74 | HR 89 | Temp 98.1°F | Ht 68.0 in | Wt 251.0 lb

## 2021-05-05 DIAGNOSIS — I10 Essential (primary) hypertension: Secondary | ICD-10-CM | POA: Diagnosis not present

## 2021-05-05 DIAGNOSIS — R7303 Prediabetes: Secondary | ICD-10-CM | POA: Diagnosis not present

## 2021-05-05 DIAGNOSIS — Z9189 Other specified personal risk factors, not elsewhere classified: Secondary | ICD-10-CM

## 2021-05-05 DIAGNOSIS — F3289 Other specified depressive episodes: Secondary | ICD-10-CM

## 2021-05-05 DIAGNOSIS — Z6838 Body mass index (BMI) 38.0-38.9, adult: Secondary | ICD-10-CM

## 2021-05-05 MED ORDER — CHLORTHALIDONE 25 MG PO TABS: 25.0000 mg | ORAL_TABLET | Freq: Every day | ORAL | 0 refills | Status: DC

## 2021-05-05 MED ORDER — TIRZEPATIDE 2.5 MG/0.5ML ~~LOC~~ SOAJ
2.5000 mg | SUBCUTANEOUS | 0 refills | Status: DC
  Filled 2021-05-05 – 2021-05-11 (×2): qty 2, 28d supply, fill #0

## 2021-05-05 MED ORDER — BUPROPION HCL ER (SR) 200 MG PO TB12: 200.0000 mg | ORAL_TABLET | Freq: Every day | ORAL | 0 refills | Status: DC

## 2021-05-05 NOTE — Progress Notes (Signed)
Chief Complaint:   OBESITY Leslie Mason is here to discuss her progress with her obesity treatment plan along with follow-up of her obesity related diagnoses. Leslie Mason is on keeping a food journal and adhering to recommended goals of 1400 calories and 95+ grams of protein daily and states she is following her eating plan approximately 85% of the time. Leslie Mason states she is walking for 35-40 minutes 3 times per week.  Today's visit was #: 20 Starting weight: 254 lbs Starting date: 05/06/2020 Today's weight: 251 lbs Today's date: 05/05/2021 Total lbs lost to date: 3 Total lbs lost since last in-office visit: 6  Interim History: Leslie Mason has been doing well with weight loss. Her hunger is controlled and she continues to work on not skipping meals.  Subjective:   1. Pre-diabetes Leslie Mason is doing well on Mounjaro, and she notes decreased polyphagia. She denies nausea or vomiting.  2. Essential hypertension Leslie Mason's blood pressure is well controlled with her medications. She denies signs of hypotension.  3. Other depression with emotional eating Leslie Mason notes decreased emotional eating behaviors on Wellbutrin, but also with the started of Mounjaro. No side effects were noted.  4. At risk for impaired metabolic function Leslie Mason is at increased risk for impaired metabolic function if protein decreases.  Assessment/Plan:   1. Pre-diabetes Leslie Mason will continue to work on weight loss, exercise, and decreasing simple carbohydrates to help decrease the risk of diabetes. We will refill Mounjaro 2.5 mg q weekly for 1 month.  2. Essential hypertension We will refill chlorthalidone 25 mg q daily #30 for 1 month. Leslie Mason will continue working on healthy weight loss and exercise to improve blood pressure control. We will watch for signs of hypotension as she continues her lifestyle modifications.  3. Other depression with emotional eating Behavior modification techniques were discussed today to  help Leslie Mason deal with her emotional/non-hunger eating behaviors. We will refill Wellbutrin SR 200 mg a daily #30 for 1 month. Orders and follow up as documented in patient record.   4. At risk for impaired metabolic function Leslie Mason was given approximately 15 minutes of impaired  metabolic function prevention counseling today. We discussed intensive lifestyle modifications today with an emphasis on specific nutrition and exercise instructions and strategies.   Repetitive spaced learning was employed today to elicit superior memory formation and behavioral change.  5. Obesity with current BMI of 38.2 Leslie Mason is currently in the action stage of change. As such, her goal is to continue with weight loss efforts. She has agreed to keeping a food journal and adhering to recommended goals of 1400 calories and 95+ grams of protein daily.   Exercise goals: As is.  Behavioral modification strategies: no skipping meals and meal planning and cooking strategies.  Leslie Mason has agreed to follow-up with our clinic in 4 weeks. She was informed of the importance of frequent follow-up visits to maximize her success with intensive lifestyle modifications for her multiple health conditions.   Objective:   Blood pressure 105/74, pulse 89, temperature 98.1 F (36.7 C), height 5\' 8"  (1.727 m), weight 251 lb (113.9 kg), SpO2 96 %. Body mass index is 38.16 kg/m.  General: Cooperative, alert, well developed, in no acute distress. HEENT: Conjunctivae and lids unremarkable. Cardiovascular: Regular rhythm.  Lungs: Normal work of breathing. Neurologic: No focal deficits.   Lab Results  Component Value Date   CREATININE 1.05 (H) 04/14/2021   BUN 19 04/14/2021   NA 142 04/14/2021   K 4.3 04/14/2021   CL 98  04/14/2021   CO2 28 04/14/2021   Lab Results  Component Value Date   ALT 12 04/14/2021   AST 17 04/14/2021   ALKPHOS 103 04/14/2021   BILITOT 0.5 04/14/2021   Lab Results  Component Value Date    HGBA1C 5.7 (H) 04/14/2021   HGBA1C 5.8 (H) 11/08/2020   HGBA1C 5.6 05/06/2020   HGBA1C 5.7 (H) 06/01/2017   Lab Results  Component Value Date   INSULIN 12.4 04/14/2021   INSULIN 7.1 11/08/2020   INSULIN 9.1 05/06/2020   Lab Results  Component Value Date   TSH 2.040 04/14/2021   Lab Results  Component Value Date   CHOL 195 04/14/2021   HDL 59 04/14/2021   LDLCALC 125 (H) 04/14/2021   TRIG 59 04/14/2021   CHOLHDL 3.3 11/08/2020   Lab Results  Component Value Date   VD25OH 106.0 (H) 04/14/2021   VD25OH 77.2 11/08/2020   VD25OH 115.0 (H) 05/06/2020   Lab Results  Component Value Date   WBC 4.8 05/06/2020   HGB 13.6 05/06/2020   HCT 42.5 05/06/2020   MCV 84 05/06/2020   PLT 209 05/06/2020   No results found for: IRON, TIBC, FERRITIN  Attestation Statements:   Reviewed by clinician on day of visit: allergies, medications, problem list, medical history, surgical history, family history, social history, and previous encounter notes.   I, Burt Knack, am acting as transcriptionist for Quillian Quince, MD.  I have reviewed the above documentation for accuracy and completeness, and I agree with the above. -  Quillian Quince, MD

## 2021-05-11 ENCOUNTER — Other Ambulatory Visit (HOSPITAL_COMMUNITY): Payer: Self-pay

## 2021-05-13 ENCOUNTER — Other Ambulatory Visit (HOSPITAL_COMMUNITY): Payer: Self-pay

## 2021-05-30 ENCOUNTER — Other Ambulatory Visit (HOSPITAL_COMMUNITY): Payer: Self-pay

## 2021-05-30 ENCOUNTER — Other Ambulatory Visit: Payer: Self-pay

## 2021-05-30 ENCOUNTER — Ambulatory Visit (INDEPENDENT_AMBULATORY_CARE_PROVIDER_SITE_OTHER): Payer: 59 | Admitting: Family Medicine

## 2021-05-30 ENCOUNTER — Encounter (INDEPENDENT_AMBULATORY_CARE_PROVIDER_SITE_OTHER): Payer: Self-pay | Admitting: Family Medicine

## 2021-05-30 VITALS — BP 115/81 | HR 62 | Temp 98.3°F | Ht 68.0 in | Wt 247.0 lb

## 2021-05-30 DIAGNOSIS — I10 Essential (primary) hypertension: Secondary | ICD-10-CM

## 2021-05-30 DIAGNOSIS — E860 Dehydration: Secondary | ICD-10-CM | POA: Diagnosis not present

## 2021-05-30 DIAGNOSIS — E8881 Metabolic syndrome: Secondary | ICD-10-CM | POA: Diagnosis not present

## 2021-05-30 DIAGNOSIS — E66812 Obesity, class 2: Secondary | ICD-10-CM

## 2021-05-30 DIAGNOSIS — Z9189 Other specified personal risk factors, not elsewhere classified: Secondary | ICD-10-CM | POA: Diagnosis not present

## 2021-05-30 DIAGNOSIS — Z6838 Body mass index (BMI) 38.0-38.9, adult: Secondary | ICD-10-CM

## 2021-05-30 DIAGNOSIS — E88819 Insulin resistance, unspecified: Secondary | ICD-10-CM

## 2021-05-30 MED ORDER — TIRZEPATIDE 2.5 MG/0.5ML ~~LOC~~ SOAJ
2.5000 mg | SUBCUTANEOUS | 0 refills | Status: DC
Start: 2021-05-30 — End: 2021-06-20
  Filled 2021-05-30: qty 2, 28d supply, fill #0

## 2021-05-30 MED ORDER — CHLORTHALIDONE 25 MG PO TABS: 25.0000 mg | ORAL_TABLET | Freq: Every day | ORAL | 0 refills | Status: DC

## 2021-05-30 NOTE — Progress Notes (Signed)
Chief Complaint:   OBESITY Arvella is here to discuss her progress with her obesity treatment plan along with follow-up of her obesity related diagnoses. Alliyah is on keeping a food journal and adhering to recommended goals of 1400 calories and 85+ grams of protein daily and states she is following her eating plan approximately 85% of the time. Cele states she is walking at work 5 times per week.   Today's visit was #: 21 Starting weight: 254 lbs Starting date: 05/06/2020 Today's weight: 247 lbs Today's date: 05/30/2021 Total lbs lost to date: 7 Total lbs lost since last in-office visit: 4  Interim History: Rozina continues to do well with weight loss. She notes her hunger is controlled and she is working on meeting her protein goals.  Subjective:   1. Essential hypertension Rosina's blood pressure is well controlled on her medications, and with diet and weight loss. She denies chest pain or headache.  2. Insulin resistance Ridhi is working on diet and exercise, and she is doing well with decreasing simple carbohydrates.   3. Dehydration Aluna hasn't been drinking as much water recently, and her GFR has decreased. She denies lightheadedness or dizziness.  4. At risk for diabetes mellitus Aadhya is at higher than average risk for developing diabetes due to obesity.   Assessment/Plan:   1. Essential hypertension Braelynne will continue working on healthy weight loss and exercise to improve blood pressure control. We will refill chlorthalidone for 1 month.  - chlorthalidone (HYGROTON) 25 MG tablet; Take 1 tablet (25 mg total) by mouth daily.  Dispense: 30 tablet; Refill: 0  2. Insulin resistance Dalayah will continue to work on weight loss, exercise, and decreasing simple carbohydrates to help decrease the risk of diabetes. We will refill Mounjaro for 1 month. Eulanda agreed to follow-up with [place] as directed to closely monitor her progress.  - tirzepatide [facility])  2.5 MG/0.5ML Pen; Inject 2.5 mg into the skin once a week.  Dispense: 2 mL; Refill: 0  3. Dehydration Yanitza is to increase her water intake to at least 80+ oz per day.  4. At risk for diabetes mellitus Jelicia was given approximately 15 minutes of diabetes education and counseling today. We discussed intensive lifestyle modifications today with an emphasis on weight loss as well as increasing exercise and decreasing simple carbohydrates in her diet. We also reviewed medication options with an emphasis on risk versus benefit of those discussed.   Repetitive spaced learning was employed today to elicit superior memory formation and behavioral change.  5. Obesity BMI today is 31 Dorothymae is currently in the action stage of change. As such, her goal is to continue with weight loss efforts. She has agreed to keeping a food journal and adhering to recommended goals of 1400 calories and 90+ grams of protein daily.   Exercise goals: As is.  Behavioral modification strategies: increasing lean protein intake, increasing water intake, no skipping meals, and better snacking choices.  Adelisa has agreed to follow-up with our clinic in 3 weeks. She was informed of the importance of frequent follow-up visits to maximize her success with intensive lifestyle modifications for her multiple health conditions.   Objective:   Blood pressure 115/81, pulse 62, temperature 98.3 F (36.8 C), temperature source Oral, height 5\' 8"  (1.727 m), weight 247 lb (112 kg), SpO2 97 %. Body mass index is 37.56 kg/m.  General: Cooperative, alert, well developed, in no acute distress. HEENT: Conjunctivae and lids unremarkable. Cardiovascular: Regular rhythm.  Lungs: Normal  work of breathing. Neurologic: No focal deficits.   Lab Results  Component Value Date   CREATININE 1.05 (H) 04/14/2021   BUN 19 04/14/2021   NA 142 04/14/2021   K 4.3 04/14/2021   CL 98 04/14/2021   CO2 28 04/14/2021   Lab Results  Component  Value Date   ALT 12 04/14/2021   AST 17 04/14/2021   ALKPHOS 103 04/14/2021   BILITOT 0.5 04/14/2021   Lab Results  Component Value Date   HGBA1C 5.7 (H) 04/14/2021   HGBA1C 5.8 (H) 11/08/2020   HGBA1C 5.6 05/06/2020   HGBA1C 5.7 (H) 06/01/2017   Lab Results  Component Value Date   INSULIN 12.4 04/14/2021   INSULIN 7.1 11/08/2020   INSULIN 9.1 05/06/2020   Lab Results  Component Value Date   TSH 2.040 04/14/2021   Lab Results  Component Value Date   CHOL 195 04/14/2021   HDL 59 04/14/2021   LDLCALC 125 (H) 04/14/2021   TRIG 59 04/14/2021   CHOLHDL 3.3 11/08/2020   Lab Results  Component Value Date   VD25OH 106.0 (H) 04/14/2021   VD25OH 77.2 11/08/2020   VD25OH 115.0 (H) 05/06/2020   Lab Results  Component Value Date   WBC 4.8 05/06/2020   HGB 13.6 05/06/2020   HCT 42.5 05/06/2020   MCV 84 05/06/2020   PLT 209 05/06/2020   No results found for: IRON, TIBC, FERRITIN  Attestation Statements:   Reviewed by clinician on day of visit: allergies, medications, problem list, medical history, surgical history, family history, social history, and previous encounter notes.   I, Burt Knack, am acting as transcriptionist for Quillian Quince, MD.  I have reviewed the above documentation for accuracy and completeness, and I agree with the above. -  Quillian Quince, MD

## 2021-06-01 ENCOUNTER — Other Ambulatory Visit (INDEPENDENT_AMBULATORY_CARE_PROVIDER_SITE_OTHER): Payer: Self-pay | Admitting: Family Medicine

## 2021-06-01 DIAGNOSIS — I10 Essential (primary) hypertension: Secondary | ICD-10-CM

## 2021-06-03 ENCOUNTER — Other Ambulatory Visit (HOSPITAL_COMMUNITY): Payer: Self-pay

## 2021-06-06 ENCOUNTER — Other Ambulatory Visit (HOSPITAL_COMMUNITY): Payer: Self-pay

## 2021-06-20 ENCOUNTER — Other Ambulatory Visit: Payer: Self-pay

## 2021-06-20 ENCOUNTER — Other Ambulatory Visit (HOSPITAL_COMMUNITY): Payer: Self-pay

## 2021-06-20 ENCOUNTER — Encounter (INDEPENDENT_AMBULATORY_CARE_PROVIDER_SITE_OTHER): Payer: Self-pay | Admitting: Family Medicine

## 2021-06-20 ENCOUNTER — Ambulatory Visit (INDEPENDENT_AMBULATORY_CARE_PROVIDER_SITE_OTHER): Payer: 59 | Admitting: Family Medicine

## 2021-06-20 VITALS — BP 115/80 | HR 68 | Temp 98.4°F | Ht 68.0 in | Wt 244.0 lb

## 2021-06-20 DIAGNOSIS — F3289 Other specified depressive episodes: Secondary | ICD-10-CM

## 2021-06-20 DIAGNOSIS — Z6838 Body mass index (BMI) 38.0-38.9, adult: Secondary | ICD-10-CM

## 2021-06-20 DIAGNOSIS — Z9189 Other specified personal risk factors, not elsewhere classified: Secondary | ICD-10-CM | POA: Diagnosis not present

## 2021-06-20 DIAGNOSIS — R7303 Prediabetes: Secondary | ICD-10-CM

## 2021-06-20 DIAGNOSIS — I10 Essential (primary) hypertension: Secondary | ICD-10-CM

## 2021-06-20 MED ORDER — TIRZEPATIDE 2.5 MG/0.5ML ~~LOC~~ SOAJ
2.5000 mg | SUBCUTANEOUS | 0 refills | Status: DC
  Filled 2021-06-20: qty 2, 28d supply, fill #0

## 2021-06-20 MED ORDER — BUPROPION HCL ER (SR) 200 MG PO TB12: 200.0000 mg | ORAL_TABLET | Freq: Every day | ORAL | 0 refills | Status: DC

## 2021-06-20 MED ORDER — CHLORTHALIDONE 25 MG PO TABS: 25.0000 mg | ORAL_TABLET | Freq: Every day | ORAL | 0 refills | Status: DC

## 2021-06-20 NOTE — Progress Notes (Signed)
Chief Complaint:   OBESITY Leslie Mason is here to discuss her progress with her obesity treatment plan along with follow-up of her obesity related diagnoses. Leslie Mason is on keeping a food journal and adhering to recommended goals of 1400 calories and 90+ grams of protein daily and states she is following her eating plan approximately 70% of the time. Leslie Mason states she is walking for 30 minutes 7 times per week.  Today's visit was #: 22 Starting weight: 254 lbs Starting date: 05/06/2020 Today's weight: 244 lbs Today's date: 06/20/2021 Total lbs lost to date: 10 Total lbs lost since last in-office visit: 3  Interim History: Leslie Mason continues to do well with diet, exercise, and weight loss. Her hunger is controlled and she is working on increasing protein in her diet.  Subjective:   1. Essential hypertension Leslie Mason's blood pressure is stable on her medications. She has no signs of lightheadedness.  2. Pre-diabetes Leslie Mason is stable on Mounjaro, and she is doing well on her medications.  3. Other depression with emotional eating Leslie Mason is stable on her medications, and she is doing well with decreasing emotional eating behaviors.  4. At risk for impaired metabolic function Leslie Mason is at increased risk for impaired metabolic function if protein decreases.  Assessment/Plan:   1. Essential hypertension We will refill chlorthalidone for 1 month. Leslie Mason will continue working on healthy weight loss and exercise to improve blood pressure control. We will watch for signs of hypotension as she continues her lifestyle modifications.  - chlorthalidone (HYGROTON) 25 MG tablet; Take 1 tablet (25 mg total) by mouth daily.  Dispense: 30 tablet; Refill: 0  2. Pre-diabetes We will refill Mounjaro for 1 month. Leslie Mason will continue to work on weight loss, exercise, and decreasing simple carbohydrates to help decrease the risk of diabetes.   - tirzepatide [facility] 2.5 MG/0.5ML Pen; Inject 2.5  mg into the skin once a week.  Dispense: 2 mL; Refill: 0  3. Other depression with emotional eating Behavior modification techniques were discussed today to help Leslie Mason deal with her emotional/non-hunger eating behaviors. We will refill Wellbutrin SR for 1 month. Orders and follow up as documented in patient record.   - buPROPion (WELLBUTRIN SR) 200 MG 12 hr tablet; Take 1 tablet (200 mg total) by mouth daily.  Dispense: 30 tablet; Refill: 0  4. At risk for impaired metabolic function Leslie Mason was given approximately 15 minutes of impaired  metabolic function prevention counseling today. We discussed intensive lifestyle modifications today with an emphasis on specific nutrition and exercise instructions and strategies.   Repetitive spaced learning was employed today to elicit superior memory formation and behavioral change.  5. Obesity BMI today is 37.1 Leslie Mason is currently in the action stage of change. As such, her goal is to continue with weight loss efforts. She has agreed to keeping a food journal and adhering to recommended goals of 1400 calories and 90+ grams of protein daily.   Exercise goals: As is.  Behavioral modification strategies: increasing lean protein intake, meal planning and cooking strategies, and holiday eating strategies .  Leslie Mason has agreed to follow-up with our clinic in 3 weeks. She was informed of the importance of frequent follow-up visits to maximize her success with intensive lifestyle modifications for her multiple health conditions.   Objective:   Blood pressure 115/80, pulse 68, temperature 98.4 F (36.9 C), height 5\' 8"  (1.727 m), weight 244 lb (110.7 kg), SpO2 100 %. Body mass index is 37.1 kg/m.  General: Cooperative, alert,  well developed, in no acute distress. HEENT: Conjunctivae and lids unremarkable. Cardiovascular: Regular rhythm.  Lungs: Normal work of breathing. Neurologic: No focal deficits.   Lab Results  Component Value Date    CREATININE 1.05 (H) 04/14/2021   BUN 19 04/14/2021   NA 142 04/14/2021   K 4.3 04/14/2021   CL 98 04/14/2021   CO2 28 04/14/2021   Lab Results  Component Value Date   ALT 12 04/14/2021   AST 17 04/14/2021   ALKPHOS 103 04/14/2021   BILITOT 0.5 04/14/2021   Lab Results  Component Value Date   HGBA1C 5.7 (H) 04/14/2021   HGBA1C 5.8 (H) 11/08/2020   HGBA1C 5.6 05/06/2020   HGBA1C 5.7 (H) 06/01/2017   Lab Results  Component Value Date   INSULIN 12.4 04/14/2021   INSULIN 7.1 11/08/2020   INSULIN 9.1 05/06/2020   Lab Results  Component Value Date   TSH 2.040 04/14/2021   Lab Results  Component Value Date   CHOL 195 04/14/2021   HDL 59 04/14/2021   LDLCALC 125 (H) 04/14/2021   TRIG 59 04/14/2021   CHOLHDL 3.3 11/08/2020   Lab Results  Component Value Date   VD25OH 106.0 (H) 04/14/2021   VD25OH 77.2 11/08/2020   VD25OH 115.0 (H) 05/06/2020   Lab Results  Component Value Date   WBC 4.8 05/06/2020   HGB 13.6 05/06/2020   HCT 42.5 05/06/2020   MCV 84 05/06/2020   PLT 209 05/06/2020   No results found for: IRON, TIBC, FERRITIN  Attestation Statements:   Reviewed by clinician on day of visit: allergies, medications, problem list, medical history, surgical history, family history, social history, and previous encounter notes.   I, Burt Knack, am acting as transcriptionist for Quillian Quince, MD.  I have reviewed the above documentation for accuracy and completeness, and I agree with the above. -  Quillian Quince, MD

## 2021-06-29 ENCOUNTER — Other Ambulatory Visit (INDEPENDENT_AMBULATORY_CARE_PROVIDER_SITE_OTHER): Payer: Self-pay | Admitting: Family Medicine

## 2021-06-29 ENCOUNTER — Encounter (INDEPENDENT_AMBULATORY_CARE_PROVIDER_SITE_OTHER): Payer: Self-pay

## 2021-06-29 DIAGNOSIS — I10 Essential (primary) hypertension: Secondary | ICD-10-CM

## 2021-06-29 NOTE — Telephone Encounter (Signed)
Last fill 06/20/21 LOV 06/20/21 Next OV 07/11/21

## 2021-06-29 NOTE — Telephone Encounter (Signed)
Sent pt Mychart message

## 2021-06-30 NOTE — Telephone Encounter (Signed)
Please see message and advise.  Thank you. ° °

## 2021-07-05 NOTE — Telephone Encounter (Signed)
LAST APPOINTMENT DATE: 06/20/21 NEXT APPOINTMENT DATE: 07/11/21   [id number] [place], [place] - [address] [place] [address] Phone: [phone] Fax: [phone]  [facility] [address] [place] [address] Phone: [phone] Fax: [phone]  Patient is requesting a refill of the following medications: Requested Prescriptions   Pending Prescriptions Disp Refills   chlorthalidone (HYGROTON) 25 MG tablet [Pharmacy Med Name: CHLORTHALIDONE 25 MG TABLET] 30 tablet 0    Sig: TAKE 1 TABLET (25 MG TOTAL) BY MOUTH DAILY.    Date last filled: 06/20/21 Previously prescribed by Dr. Dalbert Garnet  Lab Results  Component Value Date   HGBA1C 5.7 (H) 04/14/2021   HGBA1C 5.8 (H) 11/08/2020   HGBA1C 5.6 05/06/2020   Lab Results  Component Value Date   LDLCALC 125 (H) 04/14/2021   CREATININE 1.05 (H) 04/14/2021   Lab Results  Component Value Date   VD25OH 106.0 (H) 04/14/2021   VD25OH 77.2 11/08/2020   VD25OH 115.0 (H) 05/06/2020    BP Readings from Last 3 Encounters:  06/20/21 115/80  05/30/21 115/81  05/05/21 105/74

## 2021-07-05 NOTE — Telephone Encounter (Signed)
Dr.Beasley 

## 2021-07-08 ENCOUNTER — Other Ambulatory Visit: Payer: Self-pay | Admitting: Internal Medicine

## 2021-07-08 ENCOUNTER — Ambulatory Visit
Admission: RE | Admit: 2021-07-08 | Discharge: 2021-07-08 | Disposition: A | Discharge status: Home / Self Care | Payer: 59 | Source: Ambulatory Visit | Attending: Internal Medicine | Admitting: Internal Medicine

## 2021-07-08 DIAGNOSIS — M79632 Pain in left forearm: Secondary | ICD-10-CM

## 2021-07-11 ENCOUNTER — Other Ambulatory Visit: Payer: Self-pay

## 2021-07-11 ENCOUNTER — Other Ambulatory Visit (HOSPITAL_COMMUNITY): Payer: Self-pay

## 2021-07-11 ENCOUNTER — Ambulatory Visit (INDEPENDENT_AMBULATORY_CARE_PROVIDER_SITE_OTHER): Payer: 59 | Admitting: Family Medicine

## 2021-07-11 ENCOUNTER — Encounter (INDEPENDENT_AMBULATORY_CARE_PROVIDER_SITE_OTHER): Payer: Self-pay | Admitting: Family Medicine

## 2021-07-11 VITALS — BP 116/82 | HR 69 | Temp 98.0°F | Ht 68.0 in | Wt 243.0 lb

## 2021-07-11 DIAGNOSIS — Z6837 Body mass index (BMI) 37.0-37.9, adult: Secondary | ICD-10-CM | POA: Diagnosis not present

## 2021-07-11 DIAGNOSIS — Z9189 Other specified personal risk factors, not elsewhere classified: Secondary | ICD-10-CM

## 2021-07-11 DIAGNOSIS — E669 Obesity, unspecified: Secondary | ICD-10-CM | POA: Diagnosis not present

## 2021-07-11 DIAGNOSIS — R7303 Prediabetes: Secondary | ICD-10-CM

## 2021-07-11 DIAGNOSIS — Z6838 Body mass index (BMI) 38.0-38.9, adult: Secondary | ICD-10-CM

## 2021-07-11 MED ORDER — TIRZEPATIDE 2.5 MG/0.5ML ~~LOC~~ SOAJ
2.5000 mg | SUBCUTANEOUS | 0 refills | Status: DC
Start: 2021-07-11 — End: 2021-08-02
  Filled 2021-07-11: qty 2, 28d supply, fill #0
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
## 2021-07-24 ENCOUNTER — Other Ambulatory Visit (INDEPENDENT_AMBULATORY_CARE_PROVIDER_SITE_OTHER): Payer: Self-pay | Admitting: Family Medicine

## 2021-07-24 DIAGNOSIS — F3289 Other specified depressive episodes: Secondary | ICD-10-CM

## 2021-07-25 NOTE — Telephone Encounter (Signed)
Mychart message sent.

## 2021-07-25 NOTE — Telephone Encounter (Signed)
LAST APPOINTMENT DATE: 07/11/21 NEXT APPOINTMENT DATE: 08/02/21   [id number] - [place], [place] - [address] [place] [address] Phone: [phone] Fax: [phone]  [facility] [address] [place] [address] Phone: [phone] Fax: [phone]  Patient is requesting a refill of the following medications: Requested Prescriptions   Pending Prescriptions Disp Refills   buPROPion (WELLBUTRIN SR) 200 MG 12 hr tablet [Pharmacy Med Name: BUPROPION HCL SR 200 MG TABLET] 30 tablet 0    Sig: TAKE 1 TABLET BY MOUTH EVERY DAY    Date last filled: 06/20/21 Previously prescribed by Dr Dalbert Garnet  Lab Results  Component Value Date   HGBA1C 5.7 (H) 04/14/2021   HGBA1C 5.8 (H) 11/08/2020   HGBA1C 5.6 05/06/2020   Lab Results  Component Value Date   LDLCALC 125 (H) 04/14/2021   CREATININE 1.05 (H) 04/14/2021   Lab Results  Component Value Date   VD25OH 106.0 (H) 04/14/2021   VD25OH 77.2 11/08/2020   VD25OH 115.0 (H) 05/06/2020    BP Readings from Last 3 Encounters:  07/11/21 116/82  06/20/21 115/80  05/30/21 115/81

## 2021-07-25 NOTE — Telephone Encounter (Signed)
Dr.Beasley 

## 2021-07-26 ENCOUNTER — Other Ambulatory Visit (INDEPENDENT_AMBULATORY_CARE_PROVIDER_SITE_OTHER): Payer: Self-pay | Admitting: Family Medicine

## 2021-07-26 ENCOUNTER — Encounter (INDEPENDENT_AMBULATORY_CARE_PROVIDER_SITE_OTHER): Payer: Self-pay

## 2021-07-26 DIAGNOSIS — I10 Essential (primary) hypertension: Secondary | ICD-10-CM

## 2021-07-26 NOTE — Telephone Encounter (Signed)
Sent a Mychart message to pt.

## 2021-07-26 NOTE — Telephone Encounter (Signed)
Dr.Beasley 

## 2021-08-02 ENCOUNTER — Other Ambulatory Visit: Payer: Self-pay

## 2021-08-02 ENCOUNTER — Other Ambulatory Visit (HOSPITAL_COMMUNITY): Payer: Self-pay

## 2021-08-02 ENCOUNTER — Ambulatory Visit (INDEPENDENT_AMBULATORY_CARE_PROVIDER_SITE_OTHER): Payer: 59 | Admitting: Family Medicine

## 2021-08-02 ENCOUNTER — Encounter (INDEPENDENT_AMBULATORY_CARE_PROVIDER_SITE_OTHER): Payer: Self-pay | Admitting: Family Medicine

## 2021-08-02 VITALS — BP 115/75 | HR 63 | Temp 98.2°F | Ht 68.0 in | Wt 241.0 lb

## 2021-08-02 DIAGNOSIS — E669 Obesity, unspecified: Secondary | ICD-10-CM

## 2021-08-02 DIAGNOSIS — R7303 Prediabetes: Secondary | ICD-10-CM | POA: Diagnosis not present

## 2021-08-02 DIAGNOSIS — Z9189 Other specified personal risk factors, not elsewhere classified: Secondary | ICD-10-CM

## 2021-08-02 DIAGNOSIS — Z6838 Body mass index (BMI) 38.0-38.9, adult: Secondary | ICD-10-CM

## 2021-08-02 DIAGNOSIS — E66812 Obesity, class 2: Secondary | ICD-10-CM

## 2021-08-02 DIAGNOSIS — E559 Vitamin D deficiency, unspecified: Secondary | ICD-10-CM

## 2021-08-02 DIAGNOSIS — Z6836 Body mass index (BMI) 36.0-36.9, adult: Secondary | ICD-10-CM | POA: Diagnosis not present

## 2021-08-02 MED ORDER — TIRZEPATIDE 2.5 MG/0.5ML ~~LOC~~ SOAJ
2.5000 mg | SUBCUTANEOUS | 0 refills | Status: DC
  Filled 2021-08-02: qty 2, 28d supply, fill #0

## 2021-08-02 NOTE — Progress Notes (Signed)
Chief Complaint:   OBESITY Leslie Mason is here to discuss her progress with her obesity treatment plan along with follow-up of her obesity related diagnoses. Leslie Mason is on keeping a food journal and adhering to recommended goals of 1400 calories and 90+ grams of protein daily and states she is following her eating plan approximately 50% of the time. Leslie Mason states she is walking more 2 times per week.   Today's visit was #: 24 Starting weight: 254 lbs Starting date: 05/06/2020 Today's weight: 241 lbs Today's date: 08/02/2021 Total lbs lost to date: 13 Total lbs lost since last in-office visit: 2  Interim History: Leslie Mason has done well with weight loss. Her hunger is greatly reduced and she is struggling to eat enough food.  Subjective:   1. Pre-diabetes Leslie Mason is on Mounjaro and she notes decreased polyphagia, but no nausea or vomiting. She is on the lowest dose.  2. Vitamin D deficiency Leslie Mason's last Vit D level was over-replaced. She is off of Vit D.  3. At risk for impaired metabolic function Leslie Mason is at increased risk for impaired metabolic function if calories or protein is too low.  Assessment/Plan:   1. Pre-diabetes We will refill Mounjaro for 1 month. Leslie Mason will work on not skipping meals, and continue with diet to help decrease the risk of diabetes.   - tirzepatide [facility]) 2.5 MG/0.5ML Pen; Inject 2.5 mg into the skin once a week.  Dispense: 2 mL; Refill: 0  2. Vitamin D deficiency Low Vitamin D level contributes to fatigue and are associated with obesity, breast, and colon cancer. Leslie Mason will follow-up for routine testing of Vitamin D, at least 2-3 times per year to avoid over-replacement.  3. At risk for impaired metabolic function Leslie Mason was given approximately 15 minutes of impaired  metabolic function prevention counseling today. We discussed intensive lifestyle modifications today with an emphasis on specific nutrition and exercise instructions and  strategies.   Repetitive spaced learning was employed today to elicit superior memory formation and behavioral change.  4. Obesity BMI today is 36.8 Leslie Mason is currently in the action stage of change. As such, her goal is to continue with weight loss efforts. She has agreed to keeping a food journal and adhering to recommended goals of 1400 calories and 90+ grams of protein daily.   We will recheck fasting labs at her next visit.  Exercise goals: As is.  Behavioral modification strategies: no skipping meals.  Leslie Mason has agreed to follow-up with our clinic in 3 to 4 weeks. She was informed of the importance of frequent follow-up visits to maximize her success with intensive lifestyle modifications for her multiple health conditions.   Objective:   Blood pressure 115/75, pulse 63, temperature 98.2 F (36.8 C), height 5\' 8"  (1.727 m), weight 241 lb (109.3 kg), SpO2 97 %. Body mass index is 36.64 kg/m.  General: Cooperative, alert, well developed, in no acute distress. HEENT: Conjunctivae and lids unremarkable. Cardiovascular: Regular rhythm.  Lungs: Normal work of breathing. Neurologic: No focal deficits.   Lab Results  Component Value Date   CREATININE 1.05 (H) 04/14/2021   BUN 19 04/14/2021   NA 142 04/14/2021   K 4.3 04/14/2021   CL 98 04/14/2021   CO2 28 04/14/2021   Lab Results  Component Value Date   ALT 12 04/14/2021   AST 17 04/14/2021   ALKPHOS 103 04/14/2021   BILITOT 0.5 04/14/2021   Lab Results  Component Value Date   HGBA1C 5.7 (H) 04/14/2021  HGBA1C 5.8 (H) 11/08/2020   HGBA1C 5.6 05/06/2020   HGBA1C 5.7 (H) 06/01/2017   Lab Results  Component Value Date   INSULIN 12.4 04/14/2021   INSULIN 7.1 11/08/2020   INSULIN 9.1 05/06/2020   Lab Results  Component Value Date   TSH 2.040 04/14/2021   Lab Results  Component Value Date   CHOL 195 04/14/2021   HDL 59 04/14/2021   LDLCALC 125 (H) 04/14/2021   TRIG 59 04/14/2021   CHOLHDL 3.3 11/08/2020    Lab Results  Component Value Date   VD25OH 106.0 (H) 04/14/2021   VD25OH 77.2 11/08/2020   VD25OH 115.0 (H) 05/06/2020   Lab Results  Component Value Date   WBC 4.8 05/06/2020   HGB 13.6 05/06/2020   HCT 42.5 05/06/2020   MCV 84 05/06/2020   PLT 209 05/06/2020   No results found for: IRON, TIBC, FERRITIN  Attestation Statements:   Reviewed by clinician on day of visit: allergies, medications, problem list, medical history, surgical history, family history, social history, and previous encounter notes.   I, Trixie Dredge, am acting as transcriptionist for Dennard Nip, MD.  I have reviewed the above documentation for accuracy and completeness, and I agree with the above. -  Dennard Nip, MD

## 2021-08-10 LAB — HM COLONOSCOPY

## 2021-08-11 ENCOUNTER — Encounter (INDEPENDENT_AMBULATORY_CARE_PROVIDER_SITE_OTHER): Payer: Self-pay

## 2021-08-12 ENCOUNTER — Other Ambulatory Visit: Payer: Self-pay

## 2021-08-12 ENCOUNTER — Ambulatory Visit (INDEPENDENT_AMBULATORY_CARE_PROVIDER_SITE_OTHER): Payer: 59 | Admitting: Psychologist

## 2021-08-12 DIAGNOSIS — F321 Major depressive disorder, single episode, moderate: Secondary | ICD-10-CM | POA: Diagnosis not present

## 2021-08-12 NOTE — Progress Notes (Signed)
                Leslie Toutant, PsyD 
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
## 2021-08-12 NOTE — Plan of Care (Signed)

## 2021-08-15 ENCOUNTER — Telehealth (INDEPENDENT_AMBULATORY_CARE_PROVIDER_SITE_OTHER): Payer: Self-pay | Admitting: Family Medicine

## 2021-08-15 ENCOUNTER — Other Ambulatory Visit (INDEPENDENT_AMBULATORY_CARE_PROVIDER_SITE_OTHER): Payer: Self-pay | Admitting: Family Medicine

## 2021-08-15 DIAGNOSIS — I10 Essential (primary) hypertension: Secondary | ICD-10-CM

## 2021-08-15 NOTE — Telephone Encounter (Signed)
[facility] called regarding patient request to change to indapamide because of cost.

## 2021-08-15 NOTE — Telephone Encounter (Signed)
Dr.Beasley 

## 2021-08-16 NOTE — Telephone Encounter (Signed)
Will discuss during visit

## 2021-08-16 NOTE — Telephone Encounter (Signed)
LAST APPOINTMENT DATE: 08/02/21 NEXT APPOINTMENT DATE: 08/29/21   [id number] - [place], [place] [address] [place] [address] Phone: [phone] Fax: [phone]  [place] [address] [place] [address] Phone: [phone] Fax: [phone]  Patient is requesting a refill of the following medications: Requested Prescriptions   Pending Prescriptions Disp Refills   chlorthalidone (HYGROTON) 25 MG tablet [Pharmacy Med Name: CHLORTHALIDONE 25 MG TABLET] 30 tablet 0    Sig: TAKE 1 TABLET (25 MG TOTAL) BY MOUTH DAILY.    Date last filled: 06/20/21 Previously prescribed by Dr. Leafy Ro  Lab Results  Component Value Date   HGBA1C 5.7 (H) 04/14/2021   HGBA1C 5.8 (H) 11/08/2020   HGBA1C 5.6 05/06/2020   Lab Results  Component Value Date   LDLCALC 125 (H) 04/14/2021   CREATININE 1.05 (H) 04/14/2021   Lab Results  Component Value Date   VD25OH 106.0 (H) 04/14/2021   VD25OH 77.2 11/08/2020   VD25OH 115.0 (H) 05/06/2020    BP Readings from Last 3 Encounters:  08/02/21 115/75  07/11/21 116/82  06/20/21 115/80

## 2021-08-29 ENCOUNTER — Ambulatory Visit (INDEPENDENT_AMBULATORY_CARE_PROVIDER_SITE_OTHER): Payer: 59 | Admitting: Family Medicine

## 2021-08-29 ENCOUNTER — Other Ambulatory Visit (HOSPITAL_COMMUNITY): Payer: Self-pay

## 2021-08-29 ENCOUNTER — Encounter (INDEPENDENT_AMBULATORY_CARE_PROVIDER_SITE_OTHER): Payer: Self-pay | Admitting: Family Medicine

## 2021-08-29 ENCOUNTER — Other Ambulatory Visit: Payer: Self-pay

## 2021-08-29 VITALS — BP 131/89 | HR 60 | Temp 97.9°F | Ht 68.0 in | Wt 235.0 lb

## 2021-08-29 DIAGNOSIS — R7303 Prediabetes: Secondary | ICD-10-CM

## 2021-08-29 DIAGNOSIS — Z9189 Other specified personal risk factors, not elsewhere classified: Secondary | ICD-10-CM

## 2021-08-29 DIAGNOSIS — E559 Vitamin D deficiency, unspecified: Secondary | ICD-10-CM | POA: Diagnosis not present

## 2021-08-29 DIAGNOSIS — F3289 Other specified depressive episodes: Secondary | ICD-10-CM

## 2021-08-29 DIAGNOSIS — E7800 Pure hypercholesterolemia, unspecified: Secondary | ICD-10-CM | POA: Diagnosis not present

## 2021-08-29 DIAGNOSIS — I10 Essential (primary) hypertension: Secondary | ICD-10-CM | POA: Diagnosis not present

## 2021-08-29 DIAGNOSIS — Z6835 Body mass index (BMI) 35.0-35.9, adult: Secondary | ICD-10-CM

## 2021-08-29 DIAGNOSIS — E669 Obesity, unspecified: Secondary | ICD-10-CM

## 2021-08-29 MED ORDER — BUPROPION HCL ER (SR) 200 MG PO TB12: 200.0000 mg | ORAL_TABLET | Freq: Every day | ORAL | 0 refills | Status: DC

## 2021-08-29 MED ORDER — TIRZEPATIDE 2.5 MG/0.5ML ~~LOC~~ SOAJ
2.5000 mg | SUBCUTANEOUS | 0 refills | Status: DC
  Filled 2021-08-29: qty 2, 28d supply, fill #0

## 2021-08-29 MED ORDER — CHLORTHALIDONE 25 MG PO TABS: 25.0000 mg | ORAL_TABLET | Freq: Every day | ORAL | 0 refills | Status: DC

## 2021-08-30 LAB — CMP14+EGFR
ALT: 10 IU/L (ref 0–32)
AST: 19 IU/L (ref 0–40)
Albumin/Globulin Ratio: 1.4 (ref 1.2–2.2)
Albumin: 4.4 g/dL (ref 3.8–4.9)
Alkaline Phosphatase: 113 IU/L (ref 44–121)
BUN/Creatinine Ratio: 13 (ref 9–23)
BUN: 15 mg/dL (ref 6–24)
Bilirubin Total: 0.5 mg/dL (ref 0.0–1.2)
CO2: 30 mmol/L — ABNORMAL HIGH (ref 20–29)
Calcium: 10.7 mg/dL — ABNORMAL HIGH (ref 8.7–10.2)
Chloride: 99 mmol/L (ref 96–106)
Creatinine, Ser: 1.12 mg/dL — ABNORMAL HIGH (ref 0.57–1.00)
Globulin, Total: 3.2 g/dL (ref 1.5–4.5)
Glucose: 76 mg/dL (ref 70–99)
Potassium: 4.1 mmol/L (ref 3.5–5.2)
Sodium: 143 mmol/L (ref 134–144)
Total Protein: 7.6 g/dL (ref 6.0–8.5)
eGFR: 60 mL/min/{1.73_m2} (ref >59)

## 2021-08-30 LAB — LIPID PANEL WITH LDL/HDL RATIO
Cholesterol, Total: 193 mg/dL (ref 100–199)
HDL: 56 mg/dL (ref >39)
LDL Chol Calc (NIH): 126 mg/dL — ABNORMAL HIGH (ref 0–99)
LDL/HDL Ratio: 2.3 ratio (ref 0.0–3.2)
Triglycerides: 62 mg/dL (ref 0–149)
VLDL Cholesterol Cal: 11 mg/dL (ref 5–40)

## 2021-08-30 LAB — HEMOGLOBIN A1C
Est. average glucose Bld gHb Est-mCnc: 111 mg/dL
Hgb A1c MFr Bld: 5.5 % (ref 4.8–5.6)

## 2021-08-30 LAB — VITAMIN D 25 HYDROXY (VIT D DEFICIENCY, FRACTURES): Vit D, 25-Hydroxy: 93.5 ng/mL (ref 30.0–100.0)

## 2021-08-30 LAB — INSULIN, RANDOM: INSULIN: 6.2 u[IU]/mL (ref 2.6–24.9)

## 2021-08-30 NOTE — Progress Notes (Signed)
Chief Complaint:   OBESITY Leslie Mason is here to discuss her progress with her obesity treatment plan along with follow-up of her obesity related diagnoses. Leslie Mason is on keeping a food journal and adhering to recommended goals of 1400 calories and 90+ grams of protein daily and states she is following her eating plan approximately 90% of the time. Leslie Mason states she is walking.  Today's visit was #: 25 Starting weight: 254 lbs Starting date: 05/06/2020 Today's weight: 235 lbs Today's date: 08/29/2021 Total lbs lost to date: 19 Total lbs lost since last in-office visit: 6  Interim History: Leslie Mason has done well with weight loss. Her hunger is controlled and she is working on meeting her protein goals.  Subjective:   1. Pre-diabetes Leslie Mason is doing well with diet, exercise, and weight loss. She is due for labs. No side effects noted on Mounjaro.  2. Essential hypertension Leslie Mason's blood pressure is stable on her medications and with weight loss. She is due for labs.  3. Pure hypercholesterolemia Leslie Mason is not on statin, and she is doing very well with diet and weight loss.  4. Vitamin D deficiency Leslie Mason's last Vit D level had been over-replaced previously. She is due to have labs rechecked.  5. Other depression with emotional eating Leslie Mason is stable on Wellbutrin. She is doing well with decreasing her emotional eating behaviors. No side effects noted.  6. At risk for diabetes mellitus Leslie Mason is at higher than average risk for developing diabetes due to her obesity.  Assessment/Plan:   1. Pre-diabetes We will check labs today, and we will refill Mounjaro for 1 month. Leslie Mason will continue to work on weight loss, exercise, and decreasing simple carbohydrates to help decrease the risk of diabetes.   - tirzepatide [facility]) 2.5 MG/0.5ML Pen; Inject 2.5 mg into the skin once a week.  Dispense: 2 mL; Refill: 0 - Insulin, random - Hemoglobin A1c  2. Essential  hypertension We will check labs today. We will refill chlorthalidone for 1 month. Leslie Mason will continue working on healthy weight loss and exercise to improve blood pressure control. We will watch for signs of hypotension as she continues her lifestyle modifications.  - chlorthalidone (HYGROTON) 25 MG tablet; Take 1 tablet (25 mg total) by mouth daily.  Dispense: 30 tablet; Refill: 0 - CMP14+EGFR  3. Pure hypercholesterolemia Cardiovascular risk and specific lipid/LDL goals reviewed.  We discussed several lifestyle modifications today. We will check labs today. Leslie Mason will continue to work on diet, exercise and weight loss efforts. Orders and follow up as documented in patient record.   - Lipid Panel With LDL/HDL Ratio  4. Vitamin D deficiency We will check labs today. Leslie Mason will follow-up for routine testing of Vitamin D, at least 2-3 times per year to avoid over-replacement.  - VITAMIN D 25 Hydroxy (Vit-D Deficiency, Fractures)  5. Other depression with emotional eating We will refill Wellbutrin SR for 1 month. Behavior modification techniques were discussed today to help Leslie Mason deal with her emotional/non-hunger eating behaviors. Orders and follow up as documented in patient record.   - buPROPion (WELLBUTRIN SR) 200 MG 12 hr tablet; Take 1 tablet (200 mg total) by mouth daily for 1 day.  Dispense: 30 tablet; Refill: 0  6. At risk for diabetes mellitus Leslie Mason was given approximately 15 minutes of diabetic education and counseling today. We discussed intensive lifestyle modifications today with an emphasis on weight loss as well as increasing exercise and decreasing simple carbohydrates in her diet. We also reviewed  medication options with an emphasis on risk versus benefits of those discussed.  Repetitive spaced learning was employed today to elicit superior memory formation and behavioral change.  7. Obesity BMI today is 35.7 Leslie Mason is currently in the action stage of change. As  such, her goal is to continue with weight loss efforts. She has agreed to keeping a food journal and adhering to recommended goals of 1400 calories and 90+ grams of protein daily.   Exercise goals: As is.  Behavioral modification strategies: increasing lean protein intake and meal planning and cooking strategies.  Leslie Mason has agreed to follow-up with our clinic in 3 to 4 weeks. She was informed of the importance of frequent follow-up visits to maximize her success with intensive lifestyle modifications for her multiple health conditions.   Leslie Mason was informed we would discuss her lab results at her next visit unless there is a critical issue that needs to be addressed sooner. Leslie Mason agreed to keep her next visit at the agreed upon time to discuss these results.  Objective:   Blood pressure 131/89, pulse 60, temperature 97.9 F (36.6 C), height _0  (1.727 m), weight 235 lb (106.6 kg), SpO2 99 %. Body mass index is 35.73 kg/m.  General: Cooperative, alert, well developed, in no acute distress. HEENT: Conjunctivae and lids unremarkable. Cardiovascular: Regular rhythm.  Lungs: Normal work of breathing. Neurologic: No focal deficits.   Lab Results  Component Value Date   CREATININE 1.12 (H) 08/29/2021   BUN 15 08/29/2021   NA 143 08/29/2021   K 4.1 08/29/2021   CL 99 08/29/2021   CO2 30 (H) 08/29/2021   Lab Results  Component Value Date   ALT 10 08/29/2021   AST 19 08/29/2021   ALKPHOS 113 08/29/2021   BILITOT 0.5 08/29/2021   Lab Results  Component Value Date   HGBA1C 5.5 08/29/2021   HGBA1C 5.7 (H) 04/14/2021   HGBA1C 5.8 (H) 11/08/2020   HGBA1C 5.6 05/06/2020   HGBA1C 5.7 (H) 06/01/2017   Lab Results  Component Value Date   INSULIN 6.2 08/29/2021   INSULIN 12.4 04/14/2021   INSULIN 7.1 11/08/2020   INSULIN 9.1 05/06/2020   Lab Results  Component Value Date   TSH 2.040 04/14/2021   Lab Results  Component Value Date   CHOL 193 08/29/2021   HDL 56  08/29/2021   LDLCALC 126 (H) 08/29/2021   TRIG 62 08/29/2021   CHOLHDL 3.3 11/08/2020   Lab Results  Component Value Date   VD25OH 93.5 08/29/2021   VD25OH 106.0 (H) 04/14/2021   VD25OH 77.2 11/08/2020   Lab Results  Component Value Date   WBC 4.8 05/06/2020   HGB 13.6 05/06/2020   HCT 42.5 05/06/2020   MCV 84 05/06/2020   PLT 209 05/06/2020   No results found for: IRON, TIBC, FERRITIN  Attestation Statements:   Reviewed by clinician on day of visit: allergies, medications, problem list, medical history, surgical history, family history, social history, and previous encounter notes.   I, Trixie Dredge, am acting as transcriptionist for Dennard Nip, MD.  I have reviewed the above documentation for accuracy and completeness, and I agree with the above. -  Dennard Nip, MD

## 2021-09-02 ENCOUNTER — Other Ambulatory Visit: Payer: Self-pay

## 2021-09-02 ENCOUNTER — Ambulatory Visit (INDEPENDENT_AMBULATORY_CARE_PROVIDER_SITE_OTHER): Payer: 59 | Admitting: Psychologist

## 2021-09-02 DIAGNOSIS — F321 Major depressive disorder, single episode, moderate: Secondary | ICD-10-CM

## 2021-09-02 NOTE — Progress Notes (Signed)
                Diar Berkel, PsyD 

## 2021-09-02 NOTE — Progress Notes (Signed)
[facility] Counselor/Therapist Progress Note ? ?Patient ID: Leslie Mason, MRN: [id number],   ? ?Date: 09/02/2021 ? ?Time Spent: 10:04 am to 10:42 am; total time: 38 minutes ? ? This session was held via in person. The patient consented to in-person therapy and was in the clinician's office. Limits of confidentiality were discussed with the patient ? ?Treatment Type: Individual Therapy ? ?Reported Symptoms: Depression ? ?Mental Status Exam: ?Appearance:  Well Groomed     ?Behavior: Appropriate  ?Motor: Normal  ?Speech/Language:  Clear and Coherent  ?Affect: Appropriate  ?Mood: normal  ?Thought process: normal  ?Thought content:   WNL  ?Sensory/Perceptual disturbances:   WNL  ?Orientation: oriented to person, place, and time/date  ?Attention: Good  ?Concentration: Good  ?Memory: WNL  ?Fund of knowledge:  Good  ?Insight:   Fair  ?Judgment:  Fair  ?Impulse Control: Good  ? ?Risk Assessment: ?Danger to Self:  No ?Self-injurious Behavior: No ?Danger to Others: No ?Duty to Warn:no ?Physical Aggression / Violence:No  ?Access to Firearms a concern: No  ?Gang Involvement:No  ? ?Subjective: Beginning the session, the patient described herself as doing better. Elaborating, she voiced that having social support from others has helped her do better. From there, she reflected on challenges that she has experienced in being emotionally vulnerable. 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 She processed thoughts and emotions related being emotionally vulnerable. She was agreeable to the homework discussed. She denied suicidal and homicidal ideation.   ? ?Interventions:  Worked on developing a therapeutic relationship with the patient using active  listening and reflective statements. Provided emotional support using empathy and validation. Reviewed the treatment plan with the patient. Reviewed events since the intake. Praised patient  for doing well and explored what has been assisting the patient. Explored the etiology of difficulty expressing emotions. Provided psychoeducation about family of origin to assist the patient gain insight into self and others. Used socratic questions to assist the patient gain insight. Processed the level of social support patient has. Explored ways that patient can receive additional support. Assigned homework. Assessed for suicidal and homicidal ideation. ? ?Homework: Write letter to emotions ? ?Next Session: Review homework and emotional support ? 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 ?Plan:  ? ?Client Abilities: Friendly and easy to develop rapport ? ?Client Preferences:  ACT and CBT ? ?Client statement of Needs: Emotional support ? ?Treatment Level: Outpatient  ? ?Goals ?Alleviate depressive symptoms ?Recognize, accept, and cope with depressive feelings ?Develop healthy thinking patterns ?Develop healthy interpersonal relationships ? ?Objectives target date for all objectives is 08/12/2022 ?Cooperate with a medication evaluation by a physician ?Verbalize an accurate understanding of depression ?Verbalize an understanding of the treatment ?Identify and replace thoughts that support depression ?Learn and implement behavioral strategies ?Verbalize an understanding and resolution of current interpersonal problems ?Learn and implement problem solving and decision making skills ?Learn and implement conflict resolution skills to resolve interpersonal problems ?Verbalize an understanding of healthy and unhealthy emotions verbalize insight into how past relationships may be influence current experiences with depression ?Use mindfulness and acceptance strategies and increase value based behavior  ?Increase hopeful statements about the future.  ?Interventions ?Consistent with treatment model, discuss how change in cognitive, behavioral, and interpersonal can help client  alleviate depression ?CBT ?Behavioral activation help the client explore the relationship, nature of the dispute,  ?Help the client develop new interpersonal skills and relationships ?  Conduct Problem so living therapy ?Teach conflict resolution skills ?Use a process-experiential approach ?Conduct TLDP ?Conduct ACT ?Evaluate need for psychotropic medication ?Monitor adherence to medication  ? ?The patient and clinician reviewed the treatment plan on 09/02/2021. The patient approved of the treatment plan.  ? ?Hilbert Corrigan, PsyD ? ? ? ?

## 2021-09-16 ENCOUNTER — Other Ambulatory Visit (INDEPENDENT_AMBULATORY_CARE_PROVIDER_SITE_OTHER): Payer: Self-pay | Admitting: Family Medicine

## 2021-09-16 DIAGNOSIS — I10 Essential (primary) hypertension: Secondary | ICD-10-CM

## 2021-09-19 ENCOUNTER — Ambulatory Visit (INDEPENDENT_AMBULATORY_CARE_PROVIDER_SITE_OTHER): Payer: 59 | Admitting: Family Medicine

## 2021-09-19 ENCOUNTER — Other Ambulatory Visit: Payer: Self-pay

## 2021-09-19 ENCOUNTER — Encounter (INDEPENDENT_AMBULATORY_CARE_PROVIDER_SITE_OTHER): Payer: Self-pay | Admitting: Family Medicine

## 2021-09-19 ENCOUNTER — Other Ambulatory Visit (HOSPITAL_COMMUNITY): Payer: Self-pay

## 2021-09-19 VITALS — BP 110/74 | HR 61 | Temp 98.0°F | Ht 68.0 in | Wt 233.0 lb

## 2021-09-19 DIAGNOSIS — R7303 Prediabetes: Secondary | ICD-10-CM | POA: Diagnosis not present

## 2021-09-19 DIAGNOSIS — E669 Obesity, unspecified: Secondary | ICD-10-CM | POA: Diagnosis not present

## 2021-09-19 DIAGNOSIS — Z9189 Other specified personal risk factors, not elsewhere classified: Secondary | ICD-10-CM

## 2021-09-19 DIAGNOSIS — F3289 Other specified depressive episodes: Secondary | ICD-10-CM | POA: Diagnosis not present

## 2021-09-19 DIAGNOSIS — I10 Essential (primary) hypertension: Secondary | ICD-10-CM | POA: Diagnosis not present

## 2021-09-19 DIAGNOSIS — Z6835 Body mass index (BMI) 35.0-35.9, adult: Secondary | ICD-10-CM

## 2021-09-19 MED ORDER — BUPROPION HCL ER (SR) 200 MG PO TB12: 200.0000 mg | ORAL_TABLET | Freq: Every day | ORAL | 0 refills | Status: DC

## 2021-09-19 MED ORDER — METOPROLOL SUCCINATE ER 25 MG PO TB24: 25.0000 mg | ORAL_TABLET | Freq: Every day | ORAL | 0 refills | Status: DC

## 2021-09-19 MED ORDER — TIRZEPATIDE 2.5 MG/0.5ML ~~LOC~~ SOAJ
2.5000 mg | SUBCUTANEOUS | 0 refills | Status: DC
  Filled 2021-09-19: qty 2, 28d supply, fill #0

## 2021-09-20 ENCOUNTER — Other Ambulatory Visit (HOSPITAL_COMMUNITY): Payer: Self-pay

## 2021-09-22 ENCOUNTER — Other Ambulatory Visit (HOSPITAL_COMMUNITY): Payer: Self-pay

## 2021-09-22 ENCOUNTER — Telehealth (INDEPENDENT_AMBULATORY_CARE_PROVIDER_SITE_OTHER): Payer: Self-pay

## 2021-09-22 DIAGNOSIS — R7303 Prediabetes: Secondary | ICD-10-CM

## 2021-09-22 MED ORDER — TIRZEPATIDE 2.5 MG/0.5ML ~~LOC~~ SOAJ
2.5000 mg | SUBCUTANEOUS | 0 refills | Status: DC
  Filled 2021-10-05: qty 2, 28d supply, fill #0

## 2021-09-22 NOTE — Progress Notes (Signed)
? ? ? ?Chief Complaint:  ? ?OBESITY ?Leslie Mason is here to discuss her progress with her obesity treatment plan along with follow-up of her obesity related diagnoses. Leslie Mason is on keeping Leslie Mason food journal and adhering to recommended goals of 1400 calories and 90+ grams of protein daily and states she is following her eating plan approximately 75% of the time. Leslie Mason states she is walking for 30 minutes 4 times per week. ? ?Today's visit was #: 26 ?Starting weight: 254 lbs ?Starting date: 05/06/2020 ?Today's weight: 233 lbs ?Today's date: 09/19/2021 ?Total lbs lost to date: 21 ?Total lbs lost since last in-office visit: 2 ? ?Interim History: Leslie Mason continues to do well with weight loss. There was Leslie Mason death with Leslie Mason friend and she notes Leslie Mason lot more temptations and "sympathy food". She did well with minimizing emotional eating behaviors.  ? ?Subjective:  ? ?1. Essential hypertension ?Chalyn's blood pressure is stable on her medications. She is working on diet and exercise. She has no signs of hypotension or dizziness. I discussed labs with the patient today. ? ?2. Pre-diabetes ?Leslie Mason's A1c is now at goal of 5.5, and fasting insulin is below 10. I discussed labs with the patient today. ? ?3. Other depression with emotional eating ?Leslie Mason is doing well with decreasing emotional eating behaviors. Her blood pressure is not elevated, and no side effects were noted.  ? ?4. At risk for impaired metabolic function ?Leslie Mason is at increased risk for impaired metabolic function if protein drops too low. ? ?Assessment/Plan:  ? ?1. Essential hypertension ?We will refill metoprolol for 1 month. Leslie Mason is working on healthy weight loss and exercise to improve blood pressure control.  ? ?- metoprolol succinate (TOPROL-XL) 25 MG 24 hr tablet; Take 1 tablet (25 mg total) by mouth daily.  Dispense: 30 tablet; Refill: 0 ? ?2. Pre-diabetes ?We will refill Mounjaro for 90 days with no refills. Leslie Mason will continue to work on weight loss,  exercise, and decreasing simple carbohydrates to help decrease the risk of diabetes.  ? ?- tirzepatide (MOUNJARO) 2.5 MG/0.5ML Pen; Inject 2.5 mg into the skin once Leslie Mason week.  Dispense: 6 mL; Refill: 0 ? ?3. Other depression with emotional eating ?We will refill Wellbutrin SR for 1 month. Behavior modification techniques were discussed today to help Leslie Mason deal with her emotional/non-hunger eating behaviors. Orders and follow up as documented in patient record.  ? ?- buPROPion (WELLBUTRIN SR) 200 MG 12 hr tablet; Take 1 tablet (200 mg total) by mouth daily for 1 day.  Dispense: 30 tablet; Refill: 0 ? ?4. At risk for impaired metabolic function ?Leslie Mason was given approximately 15 minutes of impaired  metabolic function prevention counseling today. We discussed intensive lifestyle modifications today with an emphasis on specific nutrition and exercise instructions and strategies.  ? ?Repetitive spaced learning was employed today to elicit superior memory formation and behavioral change. ? ?5. Obesity BMI today is 35.5 ?Leslie Mason is currently in the action stage of change. As such, her goal is to continue with weight loss efforts. She has agreed to keeping Leslie Mason food journal and adhering to recommended goals of 1400 calories and 90+ grams of protein daily.  ? ?Exercise goals: As is. ? ?Behavioral modification strategies: increasing lean protein intake and meal planning and cooking strategies. ? ?Leslie Mason has agreed to follow-up with our clinic in 3 weeks. She was informed of the importance of frequent follow-up visits to maximize her success with intensive lifestyle modifications for her multiple health conditions.  ? ?Objective:  ? ?  Blood pressure 110/74, pulse 61, temperature 98 ?F (36.7 ?C), height 5\' 8"  (1.727 m), weight 233 lb (105.7 kg), SpO2 100 %. ?Body mass index is 35.43 kg/m?. ? ?General: Cooperative, alert, well developed, in no acute distress. ?HEENT: Conjunctivae and lids unremarkable. ?Cardiovascular: Regular  rhythm.  ?Lungs: Normal work of breathing. ?Neurologic: No focal deficits.  ? ?Lab Results  ?Component Value Date  ? CREATININE 1.12 (H) 08/29/2021  ? BUN 15 08/29/2021  ? NA 143 08/29/2021  ? K 4.1 08/29/2021  ? CL 99 08/29/2021  ? CO2 30 (H) 08/29/2021  ? ?Lab Results  ?Component Value Date  ? ALT 10 08/29/2021  ? AST 19 08/29/2021  ? ALKPHOS 113 08/29/2021  ? BILITOT 0.5 08/29/2021  ? ?Lab Results  ?Component Value Date  ? HGBA1C 5.5 08/29/2021  ? HGBA1C 5.7 (H) 04/14/2021  ? HGBA1C 5.8 (H) 11/08/2020  ? HGBA1C 5.6 05/06/2020  ? HGBA1C 5.7 (H) 06/01/2017  ? ?Lab Results  ?Component Value Date  ? INSULIN 6.2 08/29/2021  ? INSULIN 12.4 04/14/2021  ? INSULIN 7.1 11/08/2020  ? INSULIN 9.1 05/06/2020  ? ?Lab Results  ?Component Value Date  ? TSH 2.040 04/14/2021  ? ?Lab Results  ?Component Value Date  ? CHOL 193 08/29/2021  ? HDL 56 08/29/2021  ? LDLCALC 126 (H) 08/29/2021  ? TRIG 62 08/29/2021  ? CHOLHDL 3.3 11/08/2020  ? ?Lab Results  ?Component Value Date  ? VD25OH 93.5 08/29/2021  ? VD25OH 106.0 (H) 04/14/2021  ? VD25OH 77.2 11/08/2020  ? ?Lab Results  ?Component Value Date  ? WBC 4.8 05/06/2020  ? HGB 13.6 05/06/2020  ? HCT 42.5 05/06/2020  ? MCV 84 05/06/2020  ? PLT 209 05/06/2020  ? ?No results found for: IRON, TIBC, FERRITIN ? ?Attestation Statements:  ? ?Reviewed by clinician on day of visit: allergies, medications, problem list, medical history, surgical history, family history, social history, and previous encounter notes. ? ? ?I, 13/10/2019, am acting as transcriptionist for Burt Knack, MD. ? ?I have reviewed the above documentation for accuracy and completeness, and I agree with the above. -  Quillian Quince, MD ? ? ?

## 2021-09-22 NOTE — Telephone Encounter (Signed)
I spoke with [facility] outpatient pharmacy today in regard to a 90 day supply of Mounjaro, prescribed for patient on 09/19/21, per Dr. Leafy Ro.  The pharmacy informed me that pt's insurance will not pay for a #90day but pt can still use the 25$ coupon monthly as long as it last.  They suggested to send in another refill. ?

## 2021-09-23 ENCOUNTER — Other Ambulatory Visit: Payer: Self-pay

## 2021-09-23 ENCOUNTER — Ambulatory Visit (INDEPENDENT_AMBULATORY_CARE_PROVIDER_SITE_OTHER): Payer: 59 | Admitting: Psychologist

## 2021-09-23 DIAGNOSIS — F321 Major depressive disorder, single episode, moderate: Secondary | ICD-10-CM | POA: Diagnosis not present

## 2021-09-23 NOTE — Progress Notes (Signed)
[facility] Counselor/Therapist Progress Note ? ?Patient ID: Leslie Mason, MRN: [id number],   ? ?Date: 09/23/2021 ? ?Time Spent: 09:05 am to 9:43 am; total time: 38 minutes ? ? This session was held via in person. The patient consented to in-person therapy and was in the clinician's office. Limits of confidentiality were discussed with the patient ? ?Treatment Type: Individual Therapy ? ?Reported Symptoms: Irritability  ? ?Mental Status Exam: ?Appearance:  Well Groomed     ?Behavior: Appropriate  ?Motor: Normal  ?Speech/Language:  Clear and Coherent  ?Affect: Appropriate  ?Mood: normal  ?Thought process: normal  ?Thought content:   WNL  ?Sensory/Perceptual disturbances:   WNL  ?Orientation: oriented to person, place, and time/date  ?Attention: Good  ?Concentration: Good  ?Memory: WNL  ?Fund of knowledge:  Good  ?Insight:   Fair  ?Judgment:  Fair  ?Impulse Control: Good  ? ?Risk Assessment: ?Danger to Self:  No ?Self-injurious Behavior: No ?Danger to Others: No ?Duty to Warn:no ?Physical Aggression / Violence:No  ?Access to Firearms a concern: No  ?Gang Involvement:No  ? ?Subjective: Beginning the session, the patient described herself as doing well despite experiencing some challenging situations since the last session. Patient primarily spent the session reflecting on ways to respond to others when feeling frustrated. She also talked about implementing strategies to not get as frustrated with others.  She was agreeable to the homework discussed. She denied suicidal and homicidal ideation.   ? ?Interventions:  Worked on developing a therapeutic relationship with the patient using active  listening and reflective statements. Provided emotional support using empathy and validation. Used summary statements. Praised patient for doing well and explored what has assisted the patient. Normalized and validated expressed thoughts and emotions. Explored the different circumstances that contribute to the  patient experiencing distress and frustrated. Used socratic questions to assist the patient gain insight into self. Provided empathic statements. Explored and identified strategies that can assist in decreasing frustration. Assisted in problem solving.  Assigned homework. Assessed for suicidal and homicidal ideation. ? ?Homework: Take pauses, implement picturing self at the beach, decrease volume on phone ? ?Next Session: Review homework and emotional support ? 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 ?Plan:  ? ?Client Abilities: Friendly and easy to develop rapport ? ?Client Preferences:  ACT and CBT ? ?Client statement of Needs: Emotional support ? ?Treatment Level: Outpatient  ? ?Goals ?Alleviate depressive symptoms ?Recognize, accept, and cope with depressive feelings ?Develop healthy thinking patterns ?Develop healthy interpersonal relationships ? ?Objectives target date for all objectives is 08/12/2022 ?Cooperate with a medication evaluation by a physician ?Verbalize an accurate understanding of depression ?Verbalize an understanding of the treatment ?Identify and replace thoughts that support depression ?Learn and implement behavioral strategies ?Verbalize an understanding and resolution of current interpersonal problems ?Learn and implement problem solving and decision making skills ?Learn and implement conflict resolution skills to resolve interpersonal problems ?Verbalize an understanding of healthy and unhealthy emotions verbalize insight into how past relationships may be influence current experiences with depression ?Use mindfulness and acceptance strategies and increase value based behavior  ?Increase hopeful statements about the future.  ?Interventions ?Consistent with treatment model, discuss how change in cognitive, behavioral, and interpersonal can help client alleviate depression ?CBT ?Behavioral activation help the client explore the relationship, nature  of the dispute,  ?Help the client develop new interpersonal skills and relationships ?Conduct Problem so living therapy ?Teach conflict resolution skills ?Use a process-experiential approach ?Conduct TLDP ?Conduct ACT ?Evaluate  need for psychotropic medication ?Monitor adherence to medication  ? ?The patient and clinician reviewed the treatment plan on 09/02/2021. The patient approved of the treatment plan.  ? ?Hilbert Corrigan, PsyD ? ? ?

## 2021-09-30 ENCOUNTER — Ambulatory Visit (INDEPENDENT_AMBULATORY_CARE_PROVIDER_SITE_OTHER): Payer: 59 | Admitting: Psychologist

## 2021-09-30 DIAGNOSIS — F321 Major depressive disorder, single episode, moderate: Secondary | ICD-10-CM

## 2021-09-30 NOTE — Progress Notes (Signed)
[facility] Counselor/Therapist Progress Note ? ?Patient ID: Leslie Mason, MRN: [id number],   ? ?Date: 09/30/2021 ? ?Time Spent: 10:03 am to 10:28 am; total time: 25 minutes ? ? This session was held via in person. The patient consented to in-person therapy and was in the clinician's office. Limits of confidentiality were discussed with the patient ? ?Treatment Type: Individual Therapy ? ?Reported Symptoms: Less depressive experiences  ? ?Mental Status Exam: ?Appearance:  Well Groomed     ?Behavior: Appropriate  ?Motor: Normal  ?Speech/Language:  Clear and Coherent  ?Affect: Appropriate  ?Mood: normal  ?Thought process: normal  ?Thought content:   WNL  ?Sensory/Perceptual disturbances:   WNL  ?Orientation: oriented to person, place, and time/date  ?Attention: Good  ?Concentration: Good  ?Memory: WNL  ?Fund of knowledge:  Good  ?Insight:   Fair  ?Judgment:  Fair  ?Impulse Control: Good  ? ?Risk Assessment: ?Danger to Self:  No ?Self-injurious Behavior: No ?Danger to Others: No ?Duty to Warn:no ?Physical Aggression / Violence:No  ?Access to Firearms a concern: No  ?Gang Involvement:No  ? ?Subjective: Beginning the session, the patient described herself stated that she has been doing well. After reviewing events since the last session, patient voiced that using her creative side as helping her. Patient identified ways to continue being creative. She processed thoughts and emotions. She asked to follow up. She denied suicidal and homicidal ideation.   ? ?Interventions:  Worked on developing a therapeutic relationship with the patient using active  listening and reflective statements. Provided emotional support using empathy and validation. Praised patient for doing well. Explored what has assisted the patient. Reviewed the events since the last session. Normalized and validated expressed thoughts and emotions. Used socratic questions to assist the patient gain insight into self. Identified the theme of  creativity. Processed ways that patient could implement this concept into her life. Processed expressed thoughts and emotions. Provided empathic statements.  Assigned homework. Assessed for suicidal and homicidal ideation. ? ?Homework: Implement creativity into her life ? ?Next Session: Review homework and emotional support ? 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 ?Plan:  ? ?Client Abilities: Friendly and easy to develop rapport ? ?Client Preferences:  ACT and CBT ? ?Client statement of Needs: Emotional support ? ?Treatment Level: Outpatient  ? ?Goals ?Alleviate depressive symptoms ?Recognize, accept, and cope with depressive feelings ?Develop healthy thinking patterns ?Develop healthy interpersonal relationships ? ?Objectives target date for all objectives is 08/12/2022 ?Cooperate with a medication evaluation by a physician ?Verbalize an accurate understanding of depression ?Verbalize an understanding of the treatment ?Identify and replace thoughts that support depression ?Learn and implement behavioral strategies ?Verbalize an understanding and resolution of current interpersonal problems ?Learn and implement problem solving and decision making skills ?Learn and implement conflict resolution skills to resolve interpersonal problems ?Verbalize an understanding of healthy and unhealthy emotions verbalize insight into how past relationships may be influence current experiences with depression ?Use mindfulness and acceptance strategies and increase value based behavior  ?Increase hopeful statements about the future.  ?Interventions ?Consistent with treatment model, discuss how change in cognitive, behavioral, and interpersonal can help client alleviate depression ?CBT ?Behavioral activation help the client explore the relationship, nature of the dispute,  ?Help the client develop new interpersonal skills and relationships ?Conduct Problem so living therapy ?Teach conflict  resolution skills ?Use a process-experiential approach ?Conduct TLDP ?Conduct ACT ?Evaluate need for psychotropic medication ?Monitor adherence to medication  ? ?The patient and clinician reviewed  the treatment plan on 09/02/2021. The patient approved of the treatment plan.  ? ?Conception Chancy, PsyD ? ?

## 2021-10-05 ENCOUNTER — Other Ambulatory Visit (HOSPITAL_COMMUNITY): Payer: Self-pay

## 2021-10-06 ENCOUNTER — Other Ambulatory Visit (INDEPENDENT_AMBULATORY_CARE_PROVIDER_SITE_OTHER): Payer: Self-pay | Admitting: Family Medicine

## 2021-10-06 DIAGNOSIS — I10 Essential (primary) hypertension: Secondary | ICD-10-CM

## 2021-10-12 ENCOUNTER — Other Ambulatory Visit (HOSPITAL_COMMUNITY): Payer: Self-pay

## 2021-10-12 ENCOUNTER — Ambulatory Visit (INDEPENDENT_AMBULATORY_CARE_PROVIDER_SITE_OTHER): Payer: 59 | Admitting: Family Medicine

## 2021-10-12 ENCOUNTER — Other Ambulatory Visit (INDEPENDENT_AMBULATORY_CARE_PROVIDER_SITE_OTHER): Payer: Self-pay | Admitting: Family Medicine

## 2021-10-12 ENCOUNTER — Encounter (INDEPENDENT_AMBULATORY_CARE_PROVIDER_SITE_OTHER): Payer: Self-pay | Admitting: Family Medicine

## 2021-10-12 VITALS — BP 131/85 | HR 60 | Temp 97.3°F | Ht 68.0 in | Wt 228.0 lb

## 2021-10-12 DIAGNOSIS — F3289 Other specified depressive episodes: Secondary | ICD-10-CM

## 2021-10-12 DIAGNOSIS — I10 Essential (primary) hypertension: Secondary | ICD-10-CM

## 2021-10-12 DIAGNOSIS — Z9189 Other specified personal risk factors, not elsewhere classified: Secondary | ICD-10-CM

## 2021-10-12 DIAGNOSIS — R7303 Prediabetes: Secondary | ICD-10-CM | POA: Diagnosis not present

## 2021-10-12 DIAGNOSIS — Z6834 Body mass index (BMI) 34.0-34.9, adult: Secondary | ICD-10-CM

## 2021-10-12 DIAGNOSIS — E669 Obesity, unspecified: Secondary | ICD-10-CM

## 2021-10-12 DIAGNOSIS — K5909 Other constipation: Secondary | ICD-10-CM

## 2021-10-12 MED ORDER — DOCUSATE SODIUM 100 MG PO CAPS: 100.0000 mg | ORAL_CAPSULE | Freq: Two times a day (BID) | ORAL | 0 refills | Status: DC

## 2021-10-12 MED ORDER — BUPROPION HCL ER (SR) 200 MG PO TB12: 200.0000 mg | ORAL_TABLET | Freq: Every day | ORAL | 0 refills | Status: DC

## 2021-10-12 MED ORDER — TIRZEPATIDE 2.5 MG/0.5ML ~~LOC~~ SOAJ: 2.5000 mg | SUBCUTANEOUS | 0 refills | Status: DC

## 2021-10-12 MED ORDER — CHLORTHALIDONE 25 MG PO TABS: 25.0000 mg | ORAL_TABLET | Freq: Every day | ORAL | 0 refills | Status: DC

## 2021-10-12 MED ORDER — TIRZEPATIDE 2.5 MG/0.5ML ~~LOC~~ SOAJ
2.5000 mg | SUBCUTANEOUS | 0 refills | Status: DC
  Filled 2021-10-12: qty 6, 84d supply, fill #0
  Filled 2021-10-20: qty 2, 28d supply, fill #0
  Filled 2021-11-30: qty 2, 28d supply, fill #1
  Filled 2021-12-28: qty 2, 28d supply, fill #2

## 2021-10-12 NOTE — Telephone Encounter (Signed)
Dr.Beasley 

## 2021-10-19 NOTE — Progress Notes (Signed)
? ? ? ?Chief Complaint:  ? ?OBESITY ?Leslie Mason is here to discuss her progress with her obesity treatment plan along with follow-up of her obesity related diagnoses. Leslie Mason is on keeping a food journal and adhering to recommended goals of 1400 calories and 90+ grams of protein daily and states she is following her eating plan approximately 85% of the time. Leslie Mason states she is walking for 30-+40 minutes 5 times per week. ? ?Today's visit was #: 27 ?Starting weight: 254 lbs ?Starting date: 05/06/2020 ?Today's weight: 228 lbs ?Today's date: 10/12/2021 ?Total lbs lost to date: 45 ?Total lbs lost since last in-office visit: 5 ? ?Interim History: Leslie Mason continues to do well with weight loss even while on vacation in [place]. She is doing well with meeting her protein goals.  ? ?Subjective:  ? ?1. Essential hypertension ?Leslie Mason's blood pressure is controlled with diet and medications. She requests a refill.  ? ?2. Pre-diabetes ?Leslie Mason is stable on Mounjaro. She continues to do well with diet, exercise, and weight loss. She denies nausea, vomiting, or hypoglycemia.  ? ?3. Other constipation ?Leslie Mason notes constipation at times. And she is trying to avoid using laxatives.  ? ?4. Other depression with emotional eating ?Leslie Mason's mood is stable on her medications. She is doing well with decreasing emotional eating behaviors.  ? ?5. At risk for diabetes mellitus ?Leslie Mason is at higher than average risk for developing diabetes due to her obesity. ? ?Assessment/Plan:  ? ?1. Essential hypertension ?We will refill chlorthalidone for 90 days with no refills. Peri is working on healthy weight loss and exercise to improve blood pressure control. We will watch for signs of hypotension as she continues her lifestyle modifications. ? ?- chlorthalidone (HYGROTON) 25 MG tablet; Take 1 tablet (25 mg total) by mouth daily.  Dispense: 90 tablet; Refill: 0 ? ?2. Pre-diabetes ?We will refill Mounjaro for 90 days with no refills. Leslie Mason  will continue to work on weight loss, exercise, and decreasing simple carbohydrates to help decrease the risk of diabetes.  ? ?- tirzepatide (MOUNJARO) 2.5 MG/0.5ML Pen; Inject 2.5 mg into the skin once a week.  Dispense: 6 mL; Refill: 0 ? ?3. Other constipation ?Leslie Mason agreed to start Colace 100 mg BID with no refills. She was informed that a decrease in bowel movement frequency is normal while losing weight, but stools should not be hard or painful. Orders and follow up as documented in patient record.  ? ?- docusate sodium (COLACE) 100 MG capsule; Take 1 capsule (100 mg total) by mouth 2 (two) times daily.  Dispense: 60 capsule; Refill: 0 ? ?4. Other depression with emotional eating ?We will refill Wellbutrin SR for 90 days with no refills. Behavior modification techniques were discussed today to help Leslie Mason deal with her emotional/non-hunger eating behaviors. Orders and follow up as documented in patient record.  ? ?- buPROPion (WELLBUTRIN SR) 200 MG 12 hr tablet; Take 1 tablet (200 mg total) by mouth daily for 1 day.  Dispense: 90 tablet; Refill: 0 ? ?5. At risk for diabetes mellitus ?Leslie Mason was given approximately 15 minutes of diabetic education and counseling today. We discussed intensive lifestyle modifications today with an emphasis on weight loss as well as increasing exercise and decreasing simple carbohydrates in her diet. We also reviewed medication options with an emphasis on risk versus benefits of those discussed. ? ?Repetitive spaced learning was employed today to elicit superior memory formation and behavioral change. ? ?6. Obesity BMI today is 34.7 ?Leslie Mason is currently in the action  stage of change. As such, her goal is to continue with weight loss efforts. She has agreed to the Category 2 Plan or keeping a food journal and adhering to recommended goals of 1400 calories and 90+ grams of protein daily.  ? ?Exercise goals: As is. ? ?Behavioral modification strategies: increasing lean protein  intake and meal planning and cooking strategies. ? ?Leslie Mason has agreed to follow-up with our clinic in 4 weeks. She was informed of the importance of frequent follow-up visits to maximize her success with intensive lifestyle modifications for her multiple health conditions.  ? ?Objective:  ? ?Blood pressure 131/85, pulse 60, temperature (!) 97.3 ?F (36.3 ?C), height 5\' 8"  (1.727 m), weight 228 lb (103.4 kg), SpO2 99 %. ?Body mass index is 34.67 kg/m?. ? ?General: Cooperative, alert, well developed, in no acute distress. ?HEENT: Conjunctivae and lids unremarkable. ?Cardiovascular: Regular rhythm.  ?Lungs: Normal work of breathing. ?Neurologic: No focal deficits.  ? ?Lab Results  ?Component Value Date  ? CREATININE 1.12 (H) 08/29/2021  ? BUN 15 08/29/2021  ? NA 143 08/29/2021  ? K 4.1 08/29/2021  ? CL 99 08/29/2021  ? CO2 30 (H) 08/29/2021  ? ?Lab Results  ?Component Value Date  ? ALT 10 08/29/2021  ? AST 19 08/29/2021  ? ALKPHOS 113 08/29/2021  ? BILITOT 0.5 08/29/2021  ? ?Lab Results  ?Component Value Date  ? HGBA1C 5.5 08/29/2021  ? HGBA1C 5.7 (H) 04/14/2021  ? HGBA1C 5.8 (H) 11/08/2020  ? HGBA1C 5.6 05/06/2020  ? HGBA1C 5.7 (H) 06/01/2017  ? ?Lab Results  ?Component Value Date  ? INSULIN 6.2 08/29/2021  ? INSULIN 12.4 04/14/2021  ? INSULIN 7.1 11/08/2020  ? INSULIN 9.1 05/06/2020  ? ?Lab Results  ?Component Value Date  ? TSH 2.040 04/14/2021  ? ?Lab Results  ?Component Value Date  ? CHOL 193 08/29/2021  ? HDL 56 08/29/2021  ? LDLCALC 126 (H) 08/29/2021  ? TRIG 62 08/29/2021  ? CHOLHDL 3.3 11/08/2020  ? ?Lab Results  ?Component Value Date  ? VD25OH 93.5 08/29/2021  ? VD25OH 106.0 (H) 04/14/2021  ? VD25OH 77.2 11/08/2020  ? ?Lab Results  ?Component Value Date  ? WBC 4.8 05/06/2020  ? HGB 13.6 05/06/2020  ? HCT 42.5 05/06/2020  ? MCV 84 05/06/2020  ? PLT 209 05/06/2020  ? ?No results found for: IRON, TIBC, FERRITIN ? ?Attestation Statements:  ? ?Reviewed by clinician on day of visit: allergies, medications, problem  list, medical history, surgical history, family history, social history, and previous encounter notes. ? ? ?I, 13/10/2019, am acting as transcriptionist for Burt Knack, MD. ? ?I have reviewed the above documentation for accuracy and completeness, and I agree with the above. -  Quillian Quince, MD ? ? ?

## 2021-10-20 ENCOUNTER — Other Ambulatory Visit (HOSPITAL_COMMUNITY): Payer: Self-pay

## 2021-10-31 ENCOUNTER — Ambulatory Visit (INDEPENDENT_AMBULATORY_CARE_PROVIDER_SITE_OTHER): Payer: 59 | Admitting: Family Medicine

## 2021-11-04 ENCOUNTER — Ambulatory Visit (INDEPENDENT_AMBULATORY_CARE_PROVIDER_SITE_OTHER): Payer: 59 | Admitting: Psychologist

## 2021-11-04 DIAGNOSIS — F321 Major depressive disorder, single episode, moderate: Secondary | ICD-10-CM
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
## 2021-11-10 ENCOUNTER — Encounter (INDEPENDENT_AMBULATORY_CARE_PROVIDER_SITE_OTHER): Payer: Self-pay | Admitting: Family Medicine

## 2021-11-10 ENCOUNTER — Ambulatory Visit (INDEPENDENT_AMBULATORY_CARE_PROVIDER_SITE_OTHER): Payer: 59 | Admitting: Family Medicine

## 2021-11-10 VITALS — BP 117/74 | HR 66 | Temp 97.4°F | Ht 68.0 in | Wt 223.0 lb

## 2021-11-10 DIAGNOSIS — K5909 Other constipation: Secondary | ICD-10-CM

## 2021-11-10 DIAGNOSIS — Z9189 Other specified personal risk factors, not elsewhere classified: Secondary | ICD-10-CM

## 2021-11-10 DIAGNOSIS — I10 Essential (primary) hypertension: Secondary | ICD-10-CM | POA: Diagnosis not present

## 2021-11-10 DIAGNOSIS — R7303 Prediabetes: Secondary | ICD-10-CM

## 2021-11-10 DIAGNOSIS — Z6834 Body mass index (BMI) 34.0-34.9, adult: Secondary | ICD-10-CM

## 2021-11-10 DIAGNOSIS — E669 Obesity, unspecified: Secondary | ICD-10-CM | POA: Diagnosis not present

## 2021-11-10 MED ORDER — DOCUSATE SODIUM 100 MG PO CAPS: 100.0000 mg | ORAL_CAPSULE | Freq: Two times a day (BID) | ORAL | 0 refills | Status: DC

## 2021-11-22 ENCOUNTER — Other Ambulatory Visit: Payer: Self-pay | Admitting: Internal Medicine

## 2021-11-22 DIAGNOSIS — Z1231 Encounter for screening mammogram for malignant neoplasm of breast: Secondary | ICD-10-CM

## 2021-11-24 NOTE — Progress Notes (Signed)
Chief Complaint:   OBESITY Leslie Mason is here to discuss her progress with her obesity treatment plan along with follow-up of her obesity related diagnoses. Leslie Mason is on the Category 2 Plan or keeping a food journal and adhering to recommended goals of 1400 calories and 90+ grams of protein daily and states she is following her eating plan approximately 80% of the time. Leslie Mason states she is walking for 45 minutes 5-6 times per week.  Today's visit was #: 28 Starting weight: 254 lbs Starting date: 05/06/2020 Today's weight: 223 lbs Today's date: 11/10/2021 Total lbs lost to date: 31 Total lbs lost since last in-office visit: 5  Interim History: Leslie Mason continues to do well with weight loss. She has increased traveling and had to eat out more. She has done well with making smarter choices.   Subjective:   1. Essential hypertension Leslie Mason's blood pressure is well controlled on her medications, and with diet and weight loss.   2. Other constipation Leslie Mason is doing well on Colace, and she requests a refill today.   3. Pre-diabetes Leslie Mason has done well with her diet, exercise, and weight loss. She is doing well on her medications.   4. At risk for dehydration Leslie Mason is at risk for dehydration due to inadequate water intake.  Assessment/Plan:   1. Essential hypertension Leslie Mason will continue with her diet and exercise, and we will recheck labs in 1 month.  2. Other constipation Leslie Mason will continue Colace and we will refill for 1 month. We will follow up at her next visit.  - docusate sodium (COLACE) 100 MG capsule; Take 1 capsule (100 mg total) by mouth 2 (two) times daily.  Dispense: 60 capsule; Refill: 0  3. Pre-diabetes Leslie Mason will continue Mounjaro at 2.5 mg, and we will recheck labs in 1 month.   4. At risk for dehydration Leslie Mason was given approximately 15 minutes of dehydration prevention counseling today. Leslie Mason is at risk for dehydration due to weight loss  and current medication(s). She was encouraged to hydrate and monitor fluid status to avoid dehydration as weight loss plateaus.  5. Obesity, Current BMI 34.0 Leslie Mason is currently in the action stage of change. As such, her goal is to continue with weight loss efforts. She has agreed to the Category 2 Plan.   We will recheck fasting labs at her next visit.  Exercise goals: As is.  Behavioral modification strategies: increasing lean protein intake and increasing water intake.  Leslie Mason has agreed to follow-up with our clinic in 4 weeks. She was informed of the importance of frequent follow-up visits to maximize her success with intensive lifestyle modifications for her multiple health conditions.   Objective:   Blood pressure 117/74, pulse 66, temperature (!) 97.4 F (36.3 C), height 5\' 8"  (1.727 m), weight 223 lb (101.2 kg), SpO2 98 %. Body mass index is 33.91 kg/m.  General: Cooperative, alert, well developed, in no acute distress. HEENT: Conjunctivae and lids unremarkable. Cardiovascular: Regular rhythm.  Lungs: Normal work of breathing. Neurologic: No focal deficits.   Lab Results  Component Value Date   CREATININE 1.12 (H) 08/29/2021   BUN 15 08/29/2021   NA 143 08/29/2021   K 4.1 08/29/2021   CL 99 08/29/2021   CO2 30 (H) 08/29/2021   Lab Results  Component Value Date   ALT 10 08/29/2021   AST 19 08/29/2021   ALKPHOS 113 08/29/2021   BILITOT 0.5 08/29/2021   Lab Results  Component Value Date   HGBA1C 5.5  08/29/2021   HGBA1C 5.7 (H) 04/14/2021   HGBA1C 5.8 (H) 11/08/2020   HGBA1C 5.6 05/06/2020   HGBA1C 5.7 (H) 06/01/2017   Lab Results  Component Value Date   INSULIN 6.2 08/29/2021   INSULIN 12.4 04/14/2021   INSULIN 7.1 11/08/2020   INSULIN 9.1 05/06/2020   Lab Results  Component Value Date   TSH 2.040 04/14/2021   Lab Results  Component Value Date   CHOL 193 08/29/2021   HDL 56 08/29/2021   LDLCALC 126 (H) 08/29/2021   TRIG 62 08/29/2021    CHOLHDL 3.3 11/08/2020   Lab Results  Component Value Date   VD25OH 93.5 08/29/2021   VD25OH 106.0 (H) 04/14/2021   VD25OH 77.2 11/08/2020   Lab Results  Component Value Date   WBC 4.8 05/06/2020   HGB 13.6 05/06/2020   HCT 42.5 05/06/2020   MCV 84 05/06/2020   PLT 209 05/06/2020   No results found for: IRON, TIBC, FERRITIN  Attestation Statements:   Reviewed by clinician on day of visit: allergies, medications, problem list, medical history, surgical history, family history, social history, and previous encounter notes.   I, Burt Knack, am acting as transcriptionist for Quillian Quince, MD.  I have reviewed the above documentation for accuracy and completeness, and I agree with the above. -  Quillian Quince, MD

## 2021-11-30 ENCOUNTER — Other Ambulatory Visit (HOSPITAL_COMMUNITY): Payer: Self-pay

## 2021-12-05 ENCOUNTER — Encounter (INDEPENDENT_AMBULATORY_CARE_PROVIDER_SITE_OTHER): Payer: Self-pay | Admitting: Family Medicine

## 2021-12-05 ENCOUNTER — Ambulatory Visit (INDEPENDENT_AMBULATORY_CARE_PROVIDER_SITE_OTHER): Payer: 59 | Admitting: Family Medicine

## 2021-12-05 ENCOUNTER — Ambulatory Visit
Admission: RE | Admit: 2021-12-05 | Discharge: 2021-12-05 | Disposition: A | Discharge status: Home / Self Care | Payer: 59 | Source: Ambulatory Visit | Attending: Internal Medicine | Admitting: Internal Medicine

## 2021-12-05 VITALS — BP 136/81 | HR 60 | Temp 98.2°F | Ht 68.0 in | Wt 219.0 lb

## 2021-12-05 DIAGNOSIS — I10 Essential (primary) hypertension: Secondary | ICD-10-CM | POA: Diagnosis not present

## 2021-12-05 DIAGNOSIS — E785 Hyperlipidemia, unspecified: Secondary | ICD-10-CM | POA: Diagnosis not present

## 2021-12-05 DIAGNOSIS — R7303 Prediabetes: Secondary | ICD-10-CM

## 2021-12-05 DIAGNOSIS — E559 Vitamin D deficiency, unspecified: Secondary | ICD-10-CM | POA: Diagnosis not present

## 2021-12-05 DIAGNOSIS — F3289 Other specified depressive episodes: Secondary | ICD-10-CM

## 2021-12-05 DIAGNOSIS — Z9189 Other specified personal risk factors, not elsewhere classified: Secondary | ICD-10-CM

## 2021-12-05 DIAGNOSIS — Z6838 Body mass index (BMI) 38.0-38.9, adult: Secondary | ICD-10-CM

## 2021-12-05 DIAGNOSIS — E669 Obesity, unspecified: Secondary | ICD-10-CM

## 2021-12-05 DIAGNOSIS — Z1231 Encounter for screening mammogram for malignant neoplasm of breast: Secondary | ICD-10-CM

## 2021-12-05 DIAGNOSIS — Z7985 Long-term (current) use of injectable non-insulin antidiabetic drugs: Secondary | ICD-10-CM

## 2021-12-05 DIAGNOSIS — Z6833 Body mass index (BMI) 33.0-33.9, adult: Secondary | ICD-10-CM

## 2021-12-05 MED ORDER — CHLORTHALIDONE 25 MG PO TABS: 25.0000 mg | ORAL_TABLET | Freq: Every day | ORAL | 0 refills | Status: DC

## 2021-12-05 MED ORDER — BUPROPION HCL ER (SR) 200 MG PO TB12: 200.0000 mg | ORAL_TABLET | Freq: Every day | ORAL | 0 refills | Status: DC

## 2021-12-06 LAB — CMP14+EGFR
ALT: 9 IU/L (ref 0–32)
AST: 17 IU/L (ref 0–40)
Albumin/Globulin Ratio: 1.7 (ref 1.2–2.2)
Albumin: 4.5 g/dL (ref 3.8–4.9)
Alkaline Phosphatase: 100 IU/L (ref 44–121)
BUN/Creatinine Ratio: 15 (ref 9–23)
BUN: 17 mg/dL (ref 6–24)
Bilirubin Total: 0.5 mg/dL (ref 0.0–1.2)
CO2: 27 mmol/L (ref 20–29)
Calcium: 10.5 mg/dL — ABNORMAL HIGH (ref 8.7–10.2)
Chloride: 100 mmol/L (ref 96–106)
Creatinine, Ser: 1.13 mg/dL — ABNORMAL HIGH (ref 0.57–1.00)
Globulin, Total: 2.7 g/dL (ref 1.5–4.5)
Glucose: 77 mg/dL (ref 70–99)
Potassium: 4 mmol/L (ref 3.5–5.2)
Sodium: 142 mmol/L (ref 134–144)
Total Protein: 7.2 g/dL (ref 6.0–8.5)
eGFR: 59 mL/min/{1.73_m2} — ABNORMAL LOW (ref >59)

## 2021-12-06 LAB — HEMOGLOBIN A1C
Est. average glucose Bld gHb Est-mCnc: 108 mg/dL
Hgb A1c MFr Bld: 5.4 % (ref 4.8–5.6)

## 2021-12-06 LAB — LIPID PANEL WITH LDL/HDL RATIO
Cholesterol, Total: 200 mg/dL — ABNORMAL HIGH (ref 100–199)
HDL: 64 mg/dL (ref >39)
LDL Chol Calc (NIH): 126 mg/dL — ABNORMAL HIGH (ref 0–99)
LDL/HDL Ratio: 2 ratio (ref 0.0–3.2)
Triglycerides: 56 mg/dL (ref 0–149)
VLDL Cholesterol Cal: 10 mg/dL (ref 5–40)

## 2021-12-06 LAB — VITAMIN D 25 HYDROXY (VIT D DEFICIENCY, FRACTURES): Vit D, 25-Hydroxy: 85.4 ng/mL (ref 30.0–100.0)

## 2021-12-06 LAB — INSULIN, RANDOM: INSULIN: 9.6 u[IU]/mL (ref 2.6–24.9)

## 2021-12-07 NOTE — Progress Notes (Signed)
Chief Complaint:   OBESITY Leslie Mason is here to discuss her progress with her obesity treatment plan along with follow-up of her obesity related diagnoses. Leslie Mason is on the Category 2 Plan and states she is following her eating plan approximately 85% of the time. Leslie Mason states she is walking for 45 minutes 5 times per week.  Today's visit was #: 3 Starting weight: 254 lbs Starting date: 05/06/2020 Today's weight: 219 lbs Today's date: 12/05/2021 Total lbs lost to date: 35 Total lbs lost since last in-office visit: 4  Interim History: Leslie Mason continues to do well with weight loss on her Category 2 plan. Her hunger is controlled and she wants to make sure she is eating all of the food on her plan. She has some celebration eating coming up but she is making good "damage control" strategies.   Subjective:   1. Essential hypertension Leslie Mason's blood pressure is stable on her medications, with no side effects noted.   2. Pre-diabetes Leslie Mason is still on Mounjaro and she had some mild GI upset after eating simple carbohydrates.   3. Hyperlipidemia, unspecified hyperlipidemia type Leslie Mason is working on her diet and exercise, and she is not on statin. She is due for labs.   4. Vitamin D deficiency Leslie Mason's last Vitamin D was elevated. She has been off Vitamin D for >3 months, and she is due for labs.   5. Other depression with emotional eating Leslie Mason is doing well with decreasing emotional eating behaviors. No elevation in her blood pressure.   6. At risk for heart disease Leslie Mason is at higher than average risk for cardiovascular disease due to obesity.  Assessment/Plan:   1. Essential hypertension We will check labs and we will refill chlorthalidone for 90 days with no refills. Leslie Mason is working on healthy weight loss and exercise to improve blood pressure control. We will watch for signs of hypotension as she continues her lifestyle modifications.  - chlorthalidone  (HYGROTON) 25 MG tablet; Take 1 tablet (25 mg total) by mouth daily.  Dispense: 90 tablet; Refill: 0 - CMP14+EGFR  2. Pre-diabetes We will check labs today. Leslie Mason will continue [facility], and will continue to work on weight loss, exercise, and decreasing simple carbohydrates to help decrease the risk of diabetes.   - Insulin, random - Hemoglobin A1c  3. Hyperlipidemia, unspecified hyperlipidemia type Cardiovascular risk and specific lipid/LDL goals reviewed.  We discussed several lifestyle modifications today. We will check labs today.  Leslie Mason will continue to work on diet, exercise and weight loss efforts. Orders and follow up as documented in patient record.   - Lipid Panel With LDL/HDL Ratio  4. Vitamin D deficiency We will check labs today. Leslie Mason will follow-up for routine testing of Vitamin D, at least 2-3 times per year to avoid over-replacement.  - VITAMIN D 25 Hydroxy (Vit-D Deficiency, Fractures)  5. Other depression with emotional eating We will refill Wellbutrin SR for 90 days with no refills. Behavior modification techniques were discussed today to help Leslie Mason deal with her emotional/non-hunger eating behaviors.  Orders and follow up as documented in patient record.   - buPROPion (WELLBUTRIN SR) 200 MG 12 hr tablet; Take 1 tablet (200 mg total) by mouth daily.  Dispense: 90 tablet; Refill: 0  6. At risk for heart disease Leslie Mason was given approximately 15 minutes of coronary artery disease prevention counseling today. She is 51 y.o. female and has risk factors for heart disease including obesity. We discussed intensive lifestyle modifications today with an  emphasis on specific weight loss instructions and strategies.  Repetitive spaced learning was employed today to elicit superior memory formation and behavioral change.   7. Obesity, Current BMI 33.4 Leslie Mason is currently in the action stage of change. As such, her goal is to continue with weight loss efforts. She has  agreed to the Category 2 Plan.   Exercise goal: As is.  Behavioral modification strategies: increasing lean protein intake and celebration eating strategies.  Leslie Mason has agreed to follow-up with our clinic in 3 to 4 weeks. She was informed of the importance of frequent follow-up visits to maximize her success with intensive lifestyle modifications for her multiple health conditions.   Leslie Mason was informed we would discuss her lab results at her next visit unless there is a critical issue that needs to be addressed sooner. Leslie Mason agreed to keep her next visit at the agreed upon time to discuss these results.  Objective:   Blood pressure 136/81, pulse 60, temperature 98.2 F (36.8 C), height 5' 8"  (1.727 m), weight 219 lb (99.3 kg), SpO2 100 %. Body mass index is 33.3 kg/m.  General: Cooperative, alert, well developed, in no acute distress. HEENT: Conjunctivae and lids unremarkable. Cardiovascular: Regular rhythm.  Lungs: Normal work of breathing. Neurologic: No focal deficits.   Lab Results  Component Value Date   CREATININE 1.13 (H) 12/05/2021   BUN 17 12/05/2021   NA 142 12/05/2021   K 4.0 12/05/2021   CL 100 12/05/2021   CO2 27 12/05/2021   Lab Results  Component Value Date   ALT 9 12/05/2021   AST 17 12/05/2021   ALKPHOS 100 12/05/2021   BILITOT 0.5 12/05/2021   Lab Results  Component Value Date   HGBA1C 5.4 12/05/2021   HGBA1C 5.5 08/29/2021   HGBA1C 5.7 (H) 04/14/2021   HGBA1C 5.8 (H) 11/08/2020   HGBA1C 5.6 05/06/2020   Lab Results  Component Value Date   INSULIN 9.6 12/05/2021   INSULIN 6.2 08/29/2021   INSULIN 12.4 04/14/2021   INSULIN 7.1 11/08/2020   INSULIN 9.1 05/06/2020   Lab Results  Component Value Date   TSH 2.040 04/14/2021   Lab Results  Component Value Date   CHOL 200 (H) 12/05/2021   HDL 64 12/05/2021   LDLCALC 126 (H) 12/05/2021   TRIG 56 12/05/2021   CHOLHDL 3.3 11/08/2020   Lab Results  Component Value Date   VD25OH 85.4  12/05/2021   VD25OH 93.5 08/29/2021   VD25OH 106.0 (H) 04/14/2021   Lab Results  Component Value Date   WBC 4.8 05/06/2020   HGB 13.6 05/06/2020   HCT 42.5 05/06/2020   MCV 84 05/06/2020   PLT 209 05/06/2020   No results found for: IRON, TIBC, FERRITIN  Attestation Statements:   Reviewed by clinician on day of visit: allergies, medications, problem list, medical history, surgical history, family history, social history, and previous encounter notes.   I, Trixie Dredge, am acting as transcriptionist for Dennard Nip, MD.  I have reviewed the above documentation for accuracy and completeness, and I agree with the above. -  Dennard Nip, MD

## 2021-12-16 ENCOUNTER — Other Ambulatory Visit (INDEPENDENT_AMBULATORY_CARE_PROVIDER_SITE_OTHER): Payer: Self-pay | Admitting: Family Medicine

## 2021-12-16 DIAGNOSIS — I10 Essential (primary) hypertension: Secondary | ICD-10-CM

## 2021-12-19 NOTE — Telephone Encounter (Signed)
REQUESTING A 90 DAY SUPPLY  LAST APPOINTMENT DATE: 12/05/2021 NEXT APPOINTMENT DATE: 12/26/2021   [id number] - [place], [place] - [address]  [address] Phone: [phone] Fax: [phone]  [facility] [address] [place] [address] Phone: [phone] Fax: [phone]  Patient is requesting a refill of the following medications: Requested Prescriptions   Pending Prescriptions Disp Refills   metoprolol succinate (TOPROL-XL) 25 MG 24 hr tablet [Pharmacy Med Name: METOPROLOL SUCC ER 25 MG TAB] 90 tablet 0    Sig: TAKE 1 TABLET (25 MG TOTAL) BY MOUTH DAILY.    Date last filled: 09/19/2021 #90 Previously prescribed by: Dalbert Garnet  Lab Results  Component Value Date   HGBA1C 5.4 12/05/2021   HGBA1C 5.5 08/29/2021   HGBA1C 5.7 (H) 04/14/2021   Lab Results  Component Value Date   LDLCALC 126 (H) 12/05/2021   CREATININE 1.13 (H) 12/05/2021   Lab Results  Component Value Date   VD25OH 85.4 12/05/2021   VD25OH 93.5 08/29/2021   VD25OH 106.0 (H) 04/14/2021    BP Readings from Last 3 Encounters:  12/05/21 136/81  11/10/21 117/74  10/12/21 131/85

## 2021-12-26 ENCOUNTER — Ambulatory Visit (INDEPENDENT_AMBULATORY_CARE_PROVIDER_SITE_OTHER): Payer: 59 | Admitting: Family Medicine

## 2021-12-28 ENCOUNTER — Other Ambulatory Visit (HOSPITAL_COMMUNITY): Payer: Self-pay

## 2021-12-30 ENCOUNTER — Encounter (HOSPITAL_COMMUNITY): Payer: Self-pay | Admitting: *Deleted

## 2022-01-06 ENCOUNTER — Ambulatory Visit (INDEPENDENT_AMBULATORY_CARE_PROVIDER_SITE_OTHER): Payer: 59 | Admitting: Psychologist

## 2022-01-06 DIAGNOSIS — F321 Major depressive disorder, single episode, moderate: Secondary | ICD-10-CM

## 2022-01-06 NOTE — Progress Notes (Signed)
[facility] Counselor/Therapist Progress Note  Patient ID: Leslie Mason, MRN: [id number],    Date: 01/06/2022  Time Spent: 11:03 am to 11:21 am; total time: 18 minutes   This session was held via video webex teletherapy due to the coronavirus risk at this time. The patient consented to video teletherapy and was located at her home during this session. She is aware it is the responsibility of the patient to secure confidentiality on her end of the session. The provider was in a private home office for the duration of this session. Limits of confidentiality were discussed with the patient.   Treatment Type: Individual Therapy  Reported Symptoms: Overall better  Mental Status Exam: Appearance:  Well Groomed     Behavior: Appropriate  Motor: Normal  Speech/Language:  Clear and Coherent  Affect: Appropriate  Mood: normal  Thought process: normal  Thought content:   WNL  Sensory/Perceptual disturbances:   WNL  Orientation: oriented to person, place, and time/date  Attention: Good  Concentration: Good  Memory: WNL  Fund of knowledge:  Good  Insight:   Fair  Judgment:  Fair  Impulse Control: Good   Risk Assessment: Danger to Self:  No Self-injurious Behavior: No Danger to Others: No Duty to Warn:no Physical Aggression / Violence:No  Access to Firearms a concern: No  Gang Involvement:No   Subjective: Beginning the session, the patient described herself as doing well and denied any needs. She processed thoughts and emotions. She reflected on her growth in counseling. She processed what she learned about herself. She asked to follow up. She denied suicidal and homicidal ideation.    Interventions:  Worked on developing a therapeutic relationship with the patient using active  listening and reflective statements. Provided emotional support using empathy and validation. Reflected on events since the last session. Normalized and validated expressed thoughts. Praised  patient for doing well and explored what has assisted the patient. Processed being able to send letter to offender. Used socratic questions to assist the patient. Reflected on patient's growth in counseling and what she learned about self. Discussed next steps for counseling. Provided empathic statements. Assessed for suicidal and homicidal ideation.  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, single episode, moderate   Plan:   Client Abilities: Friendly and easy to develop rapport  Client Preferences:  ACT and CBT  Client statement of Needs: Emotional support  Treatment Level: Outpatient   Goals Alleviate depressive symptoms Recognize, accept, and cope with depressive feelings Develop healthy thinking patterns Develop healthy interpersonal relationships  Objectives target date for all objectives is 08/12/2022 Cooperate with a medication evaluation by a physician Verbalize an accurate understanding of depression Verbalize an understanding of the treatment Identify and replace thoughts that support depression Learn and implement behavioral strategies Verbalize an understanding and resolution of current interpersonal problems Learn and implement problem solving and decision making skills Learn and implement conflict resolution skills to resolve interpersonal problems Verbalize an understanding of healthy and unhealthy emotions verbalize insight into how past relationships may be influence current experiences with depression Use mindfulness and acceptance strategies and increase value based behavior  Increase hopeful statements about the future.  Interventions Consistent with treatment model, discuss how change in cognitive, behavioral, and interpersonal can help client alleviate depression CBT Behavioral activation help the client explore the relationship, nature of the dispute,  Help the client develop new interpersonal  skills and relationships Conduct Problem so living therapy Teach conflict resolution skills Use  a process-experiential approach Conduct TLDP Conduct ACT Evaluate need for psychotropic medication Monitor adherence to medication   The patient and clinician reviewed the treatment plan on 09/02/2021. The patient approved of the treatment plan.   Hilbert Corrigan, PsyD

## 2022-01-16 ENCOUNTER — Other Ambulatory Visit (HOSPITAL_COMMUNITY): Payer: Self-pay

## 2022-01-16 ENCOUNTER — Encounter (INDEPENDENT_AMBULATORY_CARE_PROVIDER_SITE_OTHER): Payer: Self-pay | Admitting: Family Medicine

## 2022-01-16 ENCOUNTER — Ambulatory Visit (INDEPENDENT_AMBULATORY_CARE_PROVIDER_SITE_OTHER): Payer: 59 | Admitting: Family Medicine

## 2022-01-16 VITALS — BP 113/79 | HR 66 | Temp 97.4°F | Ht 68.0 in | Wt 212.0 lb

## 2022-01-16 DIAGNOSIS — E669 Obesity, unspecified: Secondary | ICD-10-CM

## 2022-01-16 DIAGNOSIS — R7303 Prediabetes: Secondary | ICD-10-CM | POA: Diagnosis not present

## 2022-01-16 DIAGNOSIS — Z6832 Body mass index (BMI) 32.0-32.9, adult: Secondary | ICD-10-CM

## 2022-01-16 DIAGNOSIS — I10 Essential (primary) hypertension: Secondary | ICD-10-CM | POA: Diagnosis not present

## 2022-01-16 MED ORDER — TIRZEPATIDE 2.5 MG/0.5ML ~~LOC~~ SOAJ
2.5000 mg | SUBCUTANEOUS | 0 refills | Status: DC
  Filled 2022-01-16 – 2022-01-21 (×2): qty 2, 28d supply, fill #0
  Filled 2022-01-26: qty 6, 84d supply, fill #0

## 2022-01-17 ENCOUNTER — Encounter (INDEPENDENT_AMBULATORY_CARE_PROVIDER_SITE_OTHER): Payer: Self-pay | Admitting: Family Medicine

## 2022-01-17 NOTE — Progress Notes (Signed)
Chief Complaint:   OBESITY Leslie Mason is here to discuss her progress with her obesity treatment plan along with follow-up of her obesity related diagnoses. Leslie Mason is on the Category 2 Plan and states she is following her eating plan approximately 85% of the time. Leslie Mason states she is walking for 45 minutes 5 times per week.  Today's visit was #: 30 Starting weight: 254 lbs Starting date: 05/06/2020 Today's weight: 212 lbs Today's date: 01/16/2022 Total lbs lost to date: 42 Total lbs lost since last in-office visit: 7  Interim History: Leslie Mason continues to do well with weight loss.  She has been dealing with increased travel stress.  Her hunger is mostly controlled and she is working on staying active.  Subjective:   1. Pre-diabetes Leslie Mason is doing well with her diet and exercise, and she denies nausea, vomiting, or constipation with Mounjaro.  2. Essential hypertension Leslie Mason's blood pressure is well controlled with her diet, exercise, and medications.  She denies signs of hypotension.  Assessment/Plan:   1. Pre-diabetes We will refill Mounjaro 2.5 mg for 1 month. Leslie Mason will continue to work on weight loss, exercise, and decreasing simple carbohydrates to help decrease the risk of diabetes.   - tirzepatide [facility]) 2.5 MG/0.5ML Pen; Inject 2.5 mg into the skin once a week.  Dispense: 6 mL; Refill: 0  2. Essential hypertension Leslie Mason will continue with her diet, exercise, and medications, and we may need to decrease dosages in the near future.  3. Obesity, Current BMI 32.4 Leslie Mason is currently in the action stage of change. As such, her goal is to continue with weight loss efforts. She has agreed to the Category 2 Plan.   Exercise goals: As is.   Behavioral modification strategies: increasing lean protein intake.  Leslie Mason has agreed to follow-up with our clinic in 3 to 4 weeks. She was informed of the importance of frequent follow-up visits to maximize her success  with intensive lifestyle modifications for her multiple health conditions.   Objective:   Blood pressure 113/79, pulse 66, temperature (!) 97.4 F (36.3 C), height 5\' 8"  (1.727 m), weight 212 lb (96.2 kg), SpO2 100 %. Body mass index is 32.23 kg/m.  General: Cooperative, alert, well developed, in no acute distress. HEENT: Conjunctivae and lids unremarkable. Cardiovascular: Regular rhythm.  Lungs: Normal work of breathing. Neurologic: No focal deficits.   Lab Results  Component Value Date   CREATININE 1.13 (H) 12/05/2021   BUN 17 12/05/2021   NA 142 12/05/2021   K 4.0 12/05/2021   CL 100 12/05/2021   CO2 27 12/05/2021   Lab Results  Component Value Date   ALT 9 12/05/2021   AST 17 12/05/2021   ALKPHOS 100 12/05/2021   BILITOT 0.5 12/05/2021   Lab Results  Component Value Date   HGBA1C 5.4 12/05/2021   HGBA1C 5.5 08/29/2021   HGBA1C 5.7 (H) 04/14/2021   HGBA1C 5.8 (H) 11/08/2020   HGBA1C 5.6 05/06/2020   Lab Results  Component Value Date   INSULIN 9.6 12/05/2021   INSULIN 6.2 08/29/2021   INSULIN 12.4 04/14/2021   INSULIN 7.1 11/08/2020   INSULIN 9.1 05/06/2020   Lab Results  Component Value Date   TSH 2.040 04/14/2021   Lab Results  Component Value Date   CHOL 200 (H) 12/05/2021   HDL 64 12/05/2021   LDLCALC 126 (H) 12/05/2021   TRIG 56 12/05/2021   CHOLHDL 3.3 11/08/2020   Lab Results  Component Value Date   VD25OH 85.4  12/05/2021   VD25OH 93.5 08/29/2021   VD25OH 106.0 (H) 04/14/2021   Lab Results  Component Value Date   WBC 4.8 05/06/2020   HGB 13.6 05/06/2020   HCT 42.5 05/06/2020   MCV 84 05/06/2020   PLT 209 05/06/2020   No results found for: "IRON", "TIBC", "FERRITIN"  Attestation Statements:   Reviewed by clinician on day of visit: allergies, medications, problem list, medical history, surgical history, family history, social history, and previous encounter notes.   I, Burt Knack, am acting as transcriptionist for Quillian Quince, MD.  I have reviewed the above documentation for accuracy and completeness, and I agree with the above. -  Quillian Quince, MD

## 2022-01-18 ENCOUNTER — Telehealth (INDEPENDENT_AMBULATORY_CARE_PROVIDER_SITE_OTHER): Payer: Self-pay | Admitting: Family Medicine

## 2022-01-18 ENCOUNTER — Encounter (INDEPENDENT_AMBULATORY_CARE_PROVIDER_SITE_OTHER): Payer: Self-pay

## 2022-01-18 ENCOUNTER — Encounter (INDEPENDENT_AMBULATORY_CARE_PROVIDER_SITE_OTHER): Payer: Self-pay | Admitting: Family Medicine

## 2022-01-18 DIAGNOSIS — R7303 Prediabetes: Secondary | ICD-10-CM

## 2022-01-18 NOTE — Telephone Encounter (Signed)
Dr. Dalbert Garnet - Prior authorization for [facility] denied. Per insurance: Patient does not have type 2 diabetes. Patient already uses copay card. Patient sent denial message via mychart.

## 2022-01-23 ENCOUNTER — Other Ambulatory Visit (HOSPITAL_COMMUNITY): Payer: Self-pay

## 2022-01-26 ENCOUNTER — Other Ambulatory Visit (HOSPITAL_COMMUNITY): Payer: Self-pay

## 2022-01-26 MED ORDER — OZEMPIC (0.25 OR 0.5 MG/DOSE) 2 MG/3ML ~~LOC~~ SOPN
0.5000 mg | PEN_INJECTOR | SUBCUTANEOUS | 0 refills | Status: DC
  Filled 2022-01-26 – 2022-01-30 (×3): qty 3, 28d supply, fill #0

## 2022-01-27 ENCOUNTER — Other Ambulatory Visit (HOSPITAL_COMMUNITY): Payer: Self-pay

## 2022-01-30 ENCOUNTER — Other Ambulatory Visit (HOSPITAL_COMMUNITY): Payer: Self-pay

## 2022-01-31 ENCOUNTER — Telehealth (INDEPENDENT_AMBULATORY_CARE_PROVIDER_SITE_OTHER): Payer: Self-pay | Admitting: Family Medicine

## 2022-01-31 ENCOUNTER — Encounter (INDEPENDENT_AMBULATORY_CARE_PROVIDER_SITE_OTHER): Payer: Self-pay

## 2022-01-31 NOTE — Telephone Encounter (Signed)
Dr. Dalbert Garnet - Prior authorization denied Ozempic. Per insurance: Patient does not have type 2 diabetes. Patient sent denial message via mychart.

## 2022-02-08 ENCOUNTER — Encounter (INDEPENDENT_AMBULATORY_CARE_PROVIDER_SITE_OTHER): Payer: Self-pay

## 2022-02-13 ENCOUNTER — Encounter (INDEPENDENT_AMBULATORY_CARE_PROVIDER_SITE_OTHER): Payer: Self-pay | Admitting: Family Medicine

## 2022-02-13 ENCOUNTER — Ambulatory Visit (INDEPENDENT_AMBULATORY_CARE_PROVIDER_SITE_OTHER): Payer: 59 | Admitting: Family Medicine

## 2022-02-13 VITALS — BP 120/84 | HR 62 | Temp 97.8°F | Ht 68.0 in | Wt 216.0 lb

## 2022-02-13 DIAGNOSIS — R7303 Prediabetes: Secondary | ICD-10-CM

## 2022-02-13 DIAGNOSIS — I10 Essential (primary) hypertension: Secondary | ICD-10-CM

## 2022-02-13 DIAGNOSIS — Z6832 Body mass index (BMI) 32.0-32.9, adult: Secondary | ICD-10-CM

## 2022-02-13 DIAGNOSIS — E669 Obesity, unspecified: Secondary | ICD-10-CM | POA: Diagnosis not present

## 2022-02-13 MED ORDER — RYBELSUS 3 MG PO TABS: 3.0000 mg | ORAL_TABLET | Freq: Every day | ORAL | 0 refills | Status: DC

## 2022-02-14 ENCOUNTER — Encounter (INDEPENDENT_AMBULATORY_CARE_PROVIDER_SITE_OTHER): Payer: Self-pay

## 2022-02-14 ENCOUNTER — Telehealth (INDEPENDENT_AMBULATORY_CARE_PROVIDER_SITE_OTHER): Payer: Self-pay | Admitting: Family Medicine

## 2022-02-14 NOTE — Telephone Encounter (Signed)
Dr. Beasley - Prior authorization denied for Rybelsus. Per insurance: Patient does not have type 2 diabetes. Patient sent denial message via mychart.  

## 2022-02-15 ENCOUNTER — Other Ambulatory Visit (INDEPENDENT_AMBULATORY_CARE_PROVIDER_SITE_OTHER): Payer: Self-pay | Admitting: Family Medicine

## 2022-02-21 NOTE — Progress Notes (Signed)
Chief Complaint:   OBESITY Leslie Mason is here to discuss her progress with her obesity treatment plan along with follow-up of her obesity related diagnoses. Leslie Mason is on the Category 2 Plan and states she is following her eating plan approximately 85% of the time. Leslie Mason states she is walking for 45 minutes 4 times per week.  Today's visit was #: 31 Starting weight: 254 lbs Starting date: 05/06/2020 Today's weight: 216 lbs Today's date: 02/13/2022 Total lbs lost to date: 38 Total lbs lost since last in-office visit: 0  Interim History: Leslie Mason is retaining some fluid today. She notes increased Na+ and potatoes both of which can cause fluid retention. She notes her protein has decreased recently.   Subjective:   1. Prediabetes Leslie Mason is unable to take Ozempic or Mounjaro to prevent diabetes mellitus due to insurance denials. She is still interested in a GLP-1 if possible.   2. Essential hypertension Leslie Mason's blood pressure is stable on her medications. No chest pain or headache were noted.   Assessment/Plan:   1. Prediabetes Leslie Mason agreed to start Rybelsus 3 mg q AM #30 with no refills. She will continue to work on weight loss, exercise, and decreasing simple carbohydrates to help decrease the risk of diabetes.   2. Essential hypertension Leslie Mason will continue with her diet and exercise, and we will continue to follow.   3. Obesity, Current BMI 32.9 Leslie Mason is currently in the action stage of change. As such, her goal is to continue with weight loss efforts. She has agreed to the Category 2 Plan.   Exercise goals: As is.   Behavioral modification strategies: increasing lean protein intake and no skipping meals.  Leslie Mason has agreed to follow-up with our clinic in 3 to 4 weeks. She was informed of the importance of frequent follow-up visits to maximize her success with intensive lifestyle modifications for her multiple health conditions.   Objective:   Blood pressure  120/84, pulse 62, temperature 97.8 F (36.6 C), height 5\' 8"  (1.727 m), weight 216 lb (98 kg), SpO2 100 %. Body mass index is 32.84 kg/m.  General: Cooperative, alert, well developed, in no acute distress. HEENT: Conjunctivae and lids unremarkable. Cardiovascular: Regular rhythm.  Lungs: Normal work of breathing. Neurologic: No focal deficits.   Lab Results  Component Value Date   CREATININE 1.13 (H) 12/05/2021   BUN 17 12/05/2021   NA 142 12/05/2021   K 4.0 12/05/2021   CL 100 12/05/2021   CO2 27 12/05/2021   Lab Results  Component Value Date   ALT 9 12/05/2021   AST 17 12/05/2021   ALKPHOS 100 12/05/2021   BILITOT 0.5 12/05/2021   Lab Results  Component Value Date   HGBA1C 5.4 12/05/2021   HGBA1C 5.5 08/29/2021   HGBA1C 5.7 (H) 04/14/2021   HGBA1C 5.8 (H) 11/08/2020   HGBA1C 5.6 05/06/2020   Lab Results  Component Value Date   INSULIN 9.6 12/05/2021   INSULIN 6.2 08/29/2021   INSULIN 12.4 04/14/2021   INSULIN 7.1 11/08/2020   INSULIN 9.1 05/06/2020   Lab Results  Component Value Date   TSH 2.040 04/14/2021   Lab Results  Component Value Date   CHOL 200 (H) 12/05/2021   HDL 64 12/05/2021   LDLCALC 126 (H) 12/05/2021   TRIG 56 12/05/2021   CHOLHDL 3.3 11/08/2020   Lab Results  Component Value Date   VD25OH 85.4 12/05/2021   VD25OH 93.5 08/29/2021   VD25OH 106.0 (H) 04/14/2021   Lab Results  Component Value Date   WBC 4.8 05/06/2020   HGB 13.6 05/06/2020   HCT 42.5 05/06/2020   MCV 84 05/06/2020   PLT 209 05/06/2020   No results found for: "IRON", "TIBC", "FERRITIN"  Attestation Statements:   Reviewed by clinician on day of visit: allergies, medications, problem list, medical history, surgical history, family history, social history, and previous encounter notes.   I, Burt Knack, am acting as transcriptionist for Quillian Quince, MD.  I have reviewed the above documentation for accuracy and completeness, and I agree with the above. -   Quillian Quince, MD

## 2022-03-07 ENCOUNTER — Ambulatory Visit (INDEPENDENT_AMBULATORY_CARE_PROVIDER_SITE_OTHER): Payer: 59 | Admitting: Family Medicine

## 2022-03-07 ENCOUNTER — Encounter (INDEPENDENT_AMBULATORY_CARE_PROVIDER_SITE_OTHER): Payer: Self-pay | Admitting: Family Medicine

## 2022-03-07 ENCOUNTER — Other Ambulatory Visit (INDEPENDENT_AMBULATORY_CARE_PROVIDER_SITE_OTHER): Payer: Self-pay | Admitting: Family Medicine

## 2022-03-07 VITALS — BP 110/72 | HR 61 | Temp 97.8°F | Ht 68.0 in | Wt 219.0 lb

## 2022-03-07 DIAGNOSIS — I10 Essential (primary) hypertension: Secondary | ICD-10-CM

## 2022-03-07 DIAGNOSIS — Z6833 Body mass index (BMI) 33.0-33.9, adult: Secondary | ICD-10-CM | POA: Diagnosis not present

## 2022-03-07 DIAGNOSIS — E669 Obesity, unspecified: Secondary | ICD-10-CM | POA: Diagnosis not present

## 2022-03-07 DIAGNOSIS — R600 Localized edema: Secondary | ICD-10-CM

## 2022-03-07 MED ORDER — BD PEN NEEDLE NANO U/F 32G X 4 MM MISC: 1.0000 [IU] | 0 refills | Status: AC

## 2022-03-07 MED ORDER — CHLORTHALIDONE 25 MG PO TABS: 25.0000 mg | ORAL_TABLET | Freq: Every day | ORAL | 0 refills | Status: DC

## 2022-03-07 MED ORDER — SAXENDA 18 MG/3ML ~~LOC~~ SOPN
1.8000 mg | PEN_INJECTOR | Freq: Every day | SUBCUTANEOUS | 0 refills | Status: DC
  Filled 2022-03-16 (×2): qty 9, 30d supply, fill #0

## 2022-03-08 NOTE — Telephone Encounter (Signed)
Please let the patient know and have her see if any other pharmacy has it.

## 2022-03-11 ENCOUNTER — Other Ambulatory Visit (INDEPENDENT_AMBULATORY_CARE_PROVIDER_SITE_OTHER): Payer: Self-pay | Admitting: Family Medicine

## 2022-03-11 DIAGNOSIS — I10 Essential (primary) hypertension: Secondary | ICD-10-CM

## 2022-03-14 NOTE — Progress Notes (Signed)
Chief Complaint:   OBESITY Leslie Mason is here to discuss her progress with her obesity treatment plan along with follow-up of her obesity related diagnoses. Leslie Mason is on the Category 2 Plan and states she is following her eating plan approximately 85% of the time. Leslie Mason states she is walking for 45-60 minutes 4 times per week.  Today's visit was #: 32 Starting weight: 254 lbs Starting date: 05/06/2020 Today's weight: 219 lbs Today's date: 03/07/2022 Total lbs lost to date: 35 Total lbs lost since last in-office visit: 0  Interim History: Journey is struggling more with increased hunger when off her Ozempic due to insurance no longer covering it. She would like to try another GLP-1. She is going to [facility] soon and she is making good strategies to minimize holiday weight gain.   Subjective:   1. Essential hypertension Leslie Mason's blood pressure is stable on her medications, and she has no signs of being lightheaded.   2. Lower extremity edema Leslie Mason is retaining some fluid and she has to start wearing compression hoses.   Assessment/Plan:   1. Essential hypertension Leslie Mason will continue chlorthalidone 25 mg once daily, and we will refill for 90 days.  - chlorthalidone (HYGROTON) 25 MG tablet; Take 1 tablet (25 mg total) by mouth daily.  Dispense: 90 tablet; Refill: 0  2. Lower extremity edema Leslie Mason is to decrease her sodium and will continue to wear compression hoses. She will continue to work on her weight loss.   3. Obesity, Current BMI 33.4 Leslie Mason is currently in the action stage of change. As such, her goal is to continue with weight loss efforts. She has agreed to the Category 2 Plan.   We discussed various medication options to help [facility] with her weight loss efforts and we both agreed to start Saxenda 3 mg daily (patient is to start at 0.6 mg daily), and nano needles #100 with no refills.  - Liraglutide -Weight Management (SAXENDA) 18 MG/3ML SOPN; Inject 1.8 mg  into the skin daily.  Dispense: 9 mL; Refill: 0 - Insulin Pen Needle (BD PEN NEEDLE NANO U/F) 32G X 4 MM MISC; 1 Units by Does not apply route 1 day or 1 dose for 1 dose.  Dispense: 100 each; Refill: 0  Exercise goals: As is.   Behavioral modification strategies: increasing lean protein intake and travel eating strategies.  Leslie Mason has agreed to follow-up with our clinic in 3 to 4 weeks. She was informed of the importance of frequent follow-up visits to maximize her success with intensive lifestyle modifications for her multiple health conditions.   Objective:   Blood pressure 110/72, pulse 61, temperature 97.8 F (36.6 C), height 5\' 8"  (1.727 m), weight 219 lb (99.3 kg), SpO2 99 %. Body mass index is 33.3 kg/m.  General: Cooperative, alert, well developed, in no acute distress. HEENT: Conjunctivae and lids unremarkable. Cardiovascular: Regular rhythm.  Lungs: Normal work of breathing. Neurologic: No focal deficits.   Lab Results  Component Value Date   CREATININE 1.13 (H) 12/05/2021   BUN 17 12/05/2021   NA 142 12/05/2021   K 4.0 12/05/2021   CL 100 12/05/2021   CO2 27 12/05/2021   Lab Results  Component Value Date   ALT 9 12/05/2021   AST 17 12/05/2021   ALKPHOS 100 12/05/2021   BILITOT 0.5 12/05/2021   Lab Results  Component Value Date   HGBA1C 5.4 12/05/2021   HGBA1C 5.5 08/29/2021   HGBA1C 5.7 (H) 04/14/2021   HGBA1C 5.8 (H)  11/08/2020   HGBA1C 5.6 05/06/2020   Lab Results  Component Value Date   INSULIN 9.6 12/05/2021   INSULIN 6.2 08/29/2021   INSULIN 12.4 04/14/2021   INSULIN 7.1 11/08/2020   INSULIN 9.1 05/06/2020   Lab Results  Component Value Date   TSH 2.040 04/14/2021   Lab Results  Component Value Date   CHOL 200 (H) 12/05/2021   HDL 64 12/05/2021   LDLCALC 126 (H) 12/05/2021   TRIG 56 12/05/2021   CHOLHDL 3.3 11/08/2020   Lab Results  Component Value Date   VD25OH 85.4 12/05/2021   VD25OH 93.5 08/29/2021   VD25OH 106.0 (H)  04/14/2021   Lab Results  Component Value Date   WBC 4.8 05/06/2020   HGB 13.6 05/06/2020   HCT 42.5 05/06/2020   MCV 84 05/06/2020   PLT 209 05/06/2020   No results found for: "IRON", "TIBC", "FERRITIN"  Attestation Statements:   Reviewed by clinician on day of visit: allergies, medications, problem list, medical history, surgical history, family history, social history, and previous encounter notes.   I, Burt Knack, am acting as transcriptionist for Quillian Quince, MD.  I have reviewed the above documentation for accuracy and completeness, and I agree with the above. -  Quillian Quince, MD

## 2022-03-15 ENCOUNTER — Other Ambulatory Visit (INDEPENDENT_AMBULATORY_CARE_PROVIDER_SITE_OTHER): Payer: Self-pay | Admitting: Family Medicine

## 2022-03-15 DIAGNOSIS — I10 Essential (primary) hypertension: Secondary | ICD-10-CM

## 2022-03-16 ENCOUNTER — Encounter (INDEPENDENT_AMBULATORY_CARE_PROVIDER_SITE_OTHER): Payer: Self-pay

## 2022-03-16 ENCOUNTER — Other Ambulatory Visit (HOSPITAL_COMMUNITY): Payer: Self-pay

## 2022-03-16 ENCOUNTER — Telehealth (INDEPENDENT_AMBULATORY_CARE_PROVIDER_SITE_OTHER): Payer: Self-pay | Admitting: Family Medicine

## 2022-03-16 NOTE — Telephone Encounter (Signed)
Dr. Dalbert Garnet - Prior authorization approved for Saxenda. Effective: 03/16/2022 to 07/14/2022 Patient sent approval message via mychart.

## 2022-03-24 ENCOUNTER — Other Ambulatory Visit (INDEPENDENT_AMBULATORY_CARE_PROVIDER_SITE_OTHER): Payer: Self-pay | Admitting: Family Medicine

## 2022-03-24 DIAGNOSIS — I10 Essential (primary) hypertension: Secondary | ICD-10-CM

## 2022-03-28 ENCOUNTER — Ambulatory Visit (INDEPENDENT_AMBULATORY_CARE_PROVIDER_SITE_OTHER): Payer: 59 | Admitting: Family Medicine

## 2022-03-28 ENCOUNTER — Other Ambulatory Visit (INDEPENDENT_AMBULATORY_CARE_PROVIDER_SITE_OTHER): Payer: Self-pay | Admitting: Family Medicine

## 2022-03-28 ENCOUNTER — Encounter (INDEPENDENT_AMBULATORY_CARE_PROVIDER_SITE_OTHER): Payer: Self-pay | Admitting: Family Medicine

## 2022-03-28 ENCOUNTER — Other Ambulatory Visit (HOSPITAL_COMMUNITY): Payer: Self-pay

## 2022-03-28 VITALS — BP 122/83 | HR 60 | Temp 97.7°F | Ht 68.0 in | Wt 218.0 lb

## 2022-03-28 DIAGNOSIS — F3289 Other specified depressive episodes: Secondary | ICD-10-CM

## 2022-03-28 DIAGNOSIS — E669 Obesity, unspecified: Secondary | ICD-10-CM | POA: Diagnosis not present

## 2022-03-28 DIAGNOSIS — I10 Essential (primary) hypertension: Secondary | ICD-10-CM

## 2022-03-28 DIAGNOSIS — Z6833 Body mass index (BMI) 33.0-33.9, adult: Secondary | ICD-10-CM | POA: Diagnosis not present

## 2022-03-28 MED ORDER — METOPROLOL SUCCINATE ER 25 MG PO TB24
25.0000 mg | ORAL_TABLET | Freq: Every day | ORAL | 0 refills | Status: DC
  Filled 2022-03-28: qty 30, 30d supply, fill #0

## 2022-03-28 MED ORDER — BUPROPION HCL ER (SR) 200 MG PO TB12
200.0000 mg | ORAL_TABLET | Freq: Every day | ORAL | 0 refills | Status: DC
  Filled 2022-03-28: qty 30, 30d supply, fill #0

## 2022-03-28 MED ORDER — CHLORTHALIDONE 25 MG PO TABS
25.0000 mg | ORAL_TABLET | Freq: Every day | ORAL | 0 refills | Status: DC
  Filled 2022-03-28: qty 30, 30d supply, fill #0

## 2022-03-29 ENCOUNTER — Other Ambulatory Visit (HOSPITAL_COMMUNITY): Payer: Self-pay

## 2022-03-29 ENCOUNTER — Other Ambulatory Visit (INDEPENDENT_AMBULATORY_CARE_PROVIDER_SITE_OTHER): Payer: Self-pay | Admitting: Family Medicine

## 2022-03-29 DIAGNOSIS — Z6838 Body mass index (BMI) 38.0-38.9, adult: Secondary | ICD-10-CM

## 2022-03-30 NOTE — Progress Notes (Signed)
Chief Complaint:   OBESITY Leslie Mason is here to discuss her progress with her obesity treatment plan along with follow-up of her obesity related diagnoses. Leslie Mason is on the Category 2 Plan and states she is following her eating plan approximately 85% of the time. Leslie Mason states she is walking for 40-45 minutes 7 times per week.  Today's visit was #: 81 Starting weight: 254 lbs Starting date: 05/06/2020 Today's weight: 218 lbs Today's date: 03/28/2022 Total lbs lost to date: 36 Total lbs lost since last in-office visit: 1  Interim History: Jaicee continues to work on weight loss. She has had extra challenges with stress. She has started Saxenda from the last 3 days with no side effects noted.   Subjective:   1. Essential hypertension Leslie Mason's blood pressure is stable on her medications, but she needs a refill.   2. Other depression with emotional eating Leslie Mason notes increased stress at work and she is struggling with some comfort eating and cravings.   Assessment/Plan:   1. Essential hypertension Leslie Mason will continue her medications, and we will refill chlorthalidone and metoprolol for 90 days.   - chlorthalidone (HYGROTON) 25 MG tablet; Take 1 tablet (25 mg total) by mouth daily.  Dispense: 90 tablet; Refill: 0 - metoprolol succinate (TOPROL-XL) 25 MG 24 hr tablet; Take 1 tablet (25 mg total) by mouth daily.  Dispense: 90 tablet; Refill: 0  2. Other depression with emotional eating Leslie Mason will continue Wellbutrin SR 200 mg once daily, and we will refill for 90 days.   - buPROPion (WELLBUTRIN SR) 200 MG 12 hr tablet; Take 1 tablet (200 mg total) by mouth daily.  Dispense: 90 tablet; Refill: 0  3. Obesity, Current BMI 33.1 Leslie Mason is currently in the action stage of change. As such, her goal is to continue with weight loss efforts. She has agreed to the Category 2 Plan.   Exercise goals: As is.   Behavioral modification strategies: increasing lean protein intake and  celebration eating strategies.  Leslie Mason has agreed to follow-up with our clinic in 3 to 4 weeks. She was informed of the importance of frequent follow-up visits to maximize her success with intensive lifestyle modifications for her multiple health conditions.   Objective:   Blood pressure 122/83, pulse 60, temperature 97.7 F (36.5 C), height 5\' 8"  (1.727 m), weight 218 lb (98.9 kg), SpO2 100 %. Body mass index is 33.15 kg/m.  General: Cooperative, alert, well developed, in no acute distress. HEENT: Conjunctivae and lids unremarkable. Cardiovascular: Regular rhythm.  Lungs: Normal work of breathing. Neurologic: No focal deficits.   Lab Results  Component Value Date   CREATININE 1.13 (H) 12/05/2021   BUN 17 12/05/2021   NA 142 12/05/2021   K 4.0 12/05/2021   CL 100 12/05/2021   CO2 27 12/05/2021   Lab Results  Component Value Date   ALT 9 12/05/2021   AST 17 12/05/2021   ALKPHOS 100 12/05/2021   BILITOT 0.5 12/05/2021   Lab Results  Component Value Date   HGBA1C 5.4 12/05/2021   HGBA1C 5.5 08/29/2021   HGBA1C 5.7 (H) 04/14/2021   HGBA1C 5.8 (H) 11/08/2020   HGBA1C 5.6 05/06/2020   Lab Results  Component Value Date   INSULIN 9.6 12/05/2021   INSULIN 6.2 08/29/2021   INSULIN 12.4 04/14/2021   INSULIN 7.1 11/08/2020   INSULIN 9.1 05/06/2020   Lab Results  Component Value Date   TSH 2.040 04/14/2021   Lab Results  Component Value Date  CHOL 200 (H) 12/05/2021   HDL 64 12/05/2021   LDLCALC 126 (H) 12/05/2021   TRIG 56 12/05/2021   CHOLHDL 3.3 11/08/2020   Lab Results  Component Value Date   VD25OH 85.4 12/05/2021   VD25OH 93.5 08/29/2021   VD25OH 106.0 (H) 04/14/2021   Lab Results  Component Value Date   WBC 4.8 05/06/2020   HGB 13.6 05/06/2020   HCT 42.5 05/06/2020   MCV 84 05/06/2020   PLT 209 05/06/2020   No results found for: "IRON", "TIBC", "FERRITIN"  Attestation Statements:   Reviewed by clinician on day of visit: allergies,  medications, problem list, medical history, surgical history, family history, social history, and previous encounter notes.   I, Burt Knack, am acting as transcriptionist for Quillian Quince, MD.  I have reviewed the above documentation for accuracy and completeness, and I agree with the above. -  Quillian Quince, MD

## 2022-04-12 NOTE — Telephone Encounter (Signed)
Note not needed 

## 2022-04-18 ENCOUNTER — Encounter (INDEPENDENT_AMBULATORY_CARE_PROVIDER_SITE_OTHER): Payer: Self-pay | Admitting: Family Medicine

## 2022-04-18 ENCOUNTER — Other Ambulatory Visit (HOSPITAL_COMMUNITY): Payer: Self-pay

## 2022-04-18 ENCOUNTER — Ambulatory Visit (INDEPENDENT_AMBULATORY_CARE_PROVIDER_SITE_OTHER): Payer: 59 | Admitting: Family Medicine

## 2022-04-18 VITALS — BP 111/71 | HR 64 | Temp 97.6°F | Ht 68.0 in | Wt 217.0 lb

## 2022-04-18 DIAGNOSIS — I10 Essential (primary) hypertension: Secondary | ICD-10-CM | POA: Diagnosis not present

## 2022-04-18 DIAGNOSIS — K5909 Other constipation: Secondary | ICD-10-CM | POA: Diagnosis not present

## 2022-04-18 DIAGNOSIS — F3289 Other specified depressive episodes: Secondary | ICD-10-CM

## 2022-04-18 DIAGNOSIS — E669 Obesity, unspecified: Secondary | ICD-10-CM

## 2022-04-18 DIAGNOSIS — Z6833 Body mass index (BMI) 33.0-33.9, adult: Secondary | ICD-10-CM

## 2022-04-18 MED ORDER — BUPROPION HCL ER (SR) 200 MG PO TB12
200.0000 mg | ORAL_TABLET | Freq: Every day | ORAL | 0 refills | Status: DC
  Filled 2022-04-18: qty 30, 30d supply, fill #0

## 2022-04-18 MED ORDER — METOPROLOL SUCCINATE ER 25 MG PO TB24
25.0000 mg | ORAL_TABLET | Freq: Every day | ORAL | 0 refills | Status: DC
  Filled 2022-04-18: qty 30, 30d supply, fill #0

## 2022-04-18 MED ORDER — SAXENDA 18 MG/3ML ~~LOC~~ SOPN
3.0000 mg | PEN_INJECTOR | Freq: Every day | SUBCUTANEOUS | 0 refills | Status: DC
  Filled 2022-04-18: qty 15, 30d supply, fill #0

## 2022-04-18 MED ORDER — CHLORTHALIDONE 25 MG PO TABS
25.0000 mg | ORAL_TABLET | Freq: Every day | ORAL | 0 refills | Status: DC
  Filled 2022-04-18: qty 30, 30d supply, fill #0
  Filled 2022-05-29: qty 30, 30d supply, fill #1

## 2022-04-19 ENCOUNTER — Other Ambulatory Visit (HOSPITAL_COMMUNITY): Payer: Self-pay

## 2022-04-20 ENCOUNTER — Other Ambulatory Visit (HOSPITAL_COMMUNITY): Payer: Self-pay

## 2022-04-24 NOTE — Progress Notes (Signed)
Chief Complaint:   OBESITY Leslie Mason is here to discuss her progress with her obesity treatment plan along with follow-up of her obesity related diagnoses. Leslie Mason is on the Category 2 Plan and states she is following her eating plan approximately 80-85% of the time. Leslie Mason states she is walking for 45 minutes 4 times per week.  Today's visit was #: 34 Starting weight: 254 lbs Starting date: 05/06/2020 Today's weight: 217 lbs Today's date: 04/18/2022 Total lbs lost to date: 37 Total lbs lost since last in-office visit: 1  Interim History: Leslie Mason continues to work on diet and exercise. She is doing well on Saxenda with only 1 episode of nausea. Her hunger is controlled.   Subjective:   1. Essential hypertension Leslie Mason's blood pressure well controlled. She has no signs of hypotension.   2. Other constipation Leslie Mason is struggling with hard and painful BM, and no improvement with Colace OTC.   3. Other depression with emotional eating Leslie Mason is doing well on medications, and she notes worsening sleep and blood pressure is stable.   Assessment/Plan:   1. Essential hypertension Leslie Mason will continue her medications, and we will refill chlorthalidone and metoprolol for 90 days.   - chlorthalidone (HYGROTON) 25 MG tablet; Take 1 tablet (25 mg total) by mouth daily.  Dispense: 90 tablet; Refill: 0 - metoprolol succinate (TOPROL-XL) 25 MG 24 hr tablet; Take 1 tablet (25 mg total) by mouth daily.  Dispense: 90 tablet; Refill: 0  2. Other constipation Leslie Mason agreed to OTC start miralax 17 grams daily.  3. Other depression with emotional eating We will refill Wellbutrin SR 200 mg daily for 90 days.   - buPROPion (WELLBUTRIN SR) 200 MG 12 hr tablet; Take 1 tablet (200 mg total) by mouth daily.  Dispense: 90 tablet; Refill: 0  4. Obesity, Current BMI 33.0 Leslie Mason is currently in the action stage of change. As such, her goal is to continue with weight loss efforts. She has  agreed to the Category 2 Plan.   Leslie Mason will continue Saxenda 3 mg daily (patient is to continue at 0.6 mg for now), and we will refill for 1 month.  - Liraglutide -Weight Management (SAXENDA) 18 MG/3ML SOPN; Inject 3 mg into the skin daily.  Dispense: 15 mL; Refill: 0  Exercise goals: As is.   Behavioral modification strategies: increasing lean protein intake.  Leslie Mason has agreed to follow-up with our clinic in 3 to 4 weeks. She was informed of the importance of frequent follow-up visits to maximize her success with intensive lifestyle modifications for her multiple health conditions.   Objective:   Blood pressure 111/71, pulse 64, temperature 97.6 F (36.4 C), height 5\' 8"  (1.727 m), weight 217 lb (98.4 kg), SpO2 97 %. Body mass index is 32.99 kg/m.  General: Cooperative, alert, well developed, in no acute distress. HEENT: Conjunctivae and lids unremarkable. Cardiovascular: Regular rhythm.  Lungs: Normal work of breathing. Neurologic: No focal deficits.   Lab Results  Component Value Date   CREATININE 1.13 (H) 12/05/2021   BUN 17 12/05/2021   NA 142 12/05/2021   K 4.0 12/05/2021   CL 100 12/05/2021   CO2 27 12/05/2021   Lab Results  Component Value Date   ALT 9 12/05/2021   AST 17 12/05/2021   ALKPHOS 100 12/05/2021   BILITOT 0.5 12/05/2021   Lab Results  Component Value Date   HGBA1C 5.4 12/05/2021   HGBA1C 5.5 08/29/2021   HGBA1C 5.7 (H) 04/14/2021   HGBA1C 5.8 (  H) 11/08/2020   HGBA1C 5.6 05/06/2020   Lab Results  Component Value Date   INSULIN 9.6 12/05/2021   INSULIN 6.2 08/29/2021   INSULIN 12.4 04/14/2021   INSULIN 7.1 11/08/2020   INSULIN 9.1 05/06/2020   Lab Results  Component Value Date   TSH 2.040 04/14/2021   Lab Results  Component Value Date   CHOL 200 (H) 12/05/2021   HDL 64 12/05/2021   LDLCALC 126 (H) 12/05/2021   TRIG 56 12/05/2021   CHOLHDL 3.3 11/08/2020   Lab Results  Component Value Date   VD25OH 85.4 12/05/2021   VD25OH  93.5 08/29/2021   VD25OH 106.0 (H) 04/14/2021   Lab Results  Component Value Date   WBC 4.8 05/06/2020   HGB 13.6 05/06/2020   HCT 42.5 05/06/2020   MCV 84 05/06/2020   PLT 209 05/06/2020   No results found for: "IRON", "TIBC", "FERRITIN"  Attestation Statements:   Reviewed by clinician on day of visit: allergies, medications, problem list, medical history, surgical history, family history, social history, and previous encounter notes.   I, Trixie Dredge, am acting as transcriptionist for Dennard Nip, MD.  I have reviewed the above documentation for accuracy and completeness, and I agree with the above. -  Dennard Nip, MD

## 2022-05-09 ENCOUNTER — Other Ambulatory Visit (HOSPITAL_COMMUNITY): Payer: Self-pay

## 2022-05-09 ENCOUNTER — Encounter (INDEPENDENT_AMBULATORY_CARE_PROVIDER_SITE_OTHER): Payer: Self-pay | Admitting: Family Medicine

## 2022-05-09 ENCOUNTER — Ambulatory Visit (INDEPENDENT_AMBULATORY_CARE_PROVIDER_SITE_OTHER): Payer: 59 | Admitting: Family Medicine

## 2022-05-09 DIAGNOSIS — Z6833 Body mass index (BMI) 33.0-33.9, adult: Secondary | ICD-10-CM | POA: Diagnosis not present

## 2022-05-09 DIAGNOSIS — F3289 Other specified depressive episodes: Secondary | ICD-10-CM | POA: Diagnosis not present

## 2022-05-09 DIAGNOSIS — E669 Obesity, unspecified: Secondary | ICD-10-CM

## 2022-05-09 DIAGNOSIS — I10 Essential (primary) hypertension: Secondary | ICD-10-CM | POA: Diagnosis not present

## 2022-05-09 MED ORDER — BUPROPION HCL ER (SR) 200 MG PO TB12
200.0000 mg | ORAL_TABLET | Freq: Every day | ORAL | 0 refills | Status: DC
  Filled 2022-05-09: qty 30, 30d supply, fill #0
  Filled 2022-06-22: qty 30, 30d supply, fill #1
  Filled 2022-07-28: qty 30, 30d supply, fill #2

## 2022-05-09 MED ORDER — METOPROLOL SUCCINATE ER 25 MG PO TB24
25.0000 mg | ORAL_TABLET | Freq: Every day | ORAL | 0 refills | Status: DC
  Filled 2022-05-09 – 2022-05-29 (×2): qty 30, 30d supply, fill #0

## 2022-05-09 MED ORDER — SAXENDA 18 MG/3ML ~~LOC~~ SOPN
3.0000 mg | PEN_INJECTOR | Freq: Every day | SUBCUTANEOUS | 0 refills | Status: DC
  Filled 2022-05-09: qty 15, 30d supply, fill #0

## 2022-05-11 ENCOUNTER — Other Ambulatory Visit (HOSPITAL_COMMUNITY): Payer: Self-pay

## 2022-05-17 NOTE — Progress Notes (Signed)
Chief Complaint:   OBESITY Leslie Mason is here to discuss her progress with her obesity treatment plan along with follow-up of her obesity related diagnoses. Leslie Mason is on the Category 2 Plan and states she is following her eating plan approximately 70% of the time. Leslie Mason states she is walking for 30-45 minutes 3-4 times per week.  Today's visit was #: 24 Starting weight: 254 lbs Starting date: 05/06/2020 Today's weight: 218 lbs Today's date: 05/09/2022 Total lbs lost to date: 36 Total lbs lost since last in-office visit: 0  Interim History: Leslie Mason has maintained her weight loss.  She traveled to [place] and stayed at a resort and had a wonderful trip.  No side effects with Saxenda 0.6 mg daily was noted.  Subjective:   1. Essential hypertension Leslie Mason is taking metoprolol with no side effects.  Her blood pressure is under good control.  2. Other depression with emotional eating Leslie Mason is taking Wellbutrin, and she still notes some cravings of sweets but better overall.  No side effects were noted.  Assessment/Plan:   1. Essential hypertension Leslie Mason will continue metoprolol, diet, and exercise.  We will refill metoprolol for 90 days.  - metoprolol succinate (TOPROL-XL) 25 MG 24 hr tablet; Take 1 tablet (25 mg total) by mouth daily.  Dispense: 90 tablet; Refill: 0  2. Other depression with emotional eating Leslie Mason will continue Wellbutrin SR 200 mg once daily, and we will refill for 90 days.  - buPROPion (WELLBUTRIN SR) 200 MG 12 hr tablet; Take 1 tablet (200 mg total) by mouth daily.  Dispense: 90 tablet; Refill: 0  3. Obesity, Current BMI 33.2 Leslie Mason is currently in the action stage of change. As such, her goal is to continue with weight loss efforts. She has agreed to the Category 2 Plan.   Leslie Mason agreed to increase Saxenda to 1.2 mg, and we will refill at 3 mg for 1 month.   - Liraglutide -Weight Management (SAXENDA) 18 MG/3ML SOPN; Inject 3 mg into the skin daily.   Dispense: 15 mL; Refill: 0  Exercise goals: As is.   Behavioral modification strategies: meal planning and cooking strategies and holiday eating strategies .  Leslie Mason has agreed to follow-up with our clinic in 4 weeks. She was informed of the importance of frequent follow-up visits to maximize her success with intensive lifestyle modifications for her multiple health conditions.   Objective:   Blood pressure 116/75, pulse 66, temperature 97.7 F (36.5 C), height 5\' 8"  (1.727 m), weight 218 lb (98.9 kg), SpO2 99 %. Body mass index is 33.15 kg/m.  General: Cooperative, alert, well developed, in no acute distress. HEENT: Conjunctivae and lids unremarkable. Cardiovascular: Regular rhythm.  Lungs: Normal work of breathing. Neurologic: No focal deficits.   Lab Results  Component Value Date   CREATININE 1.13 (H) 12/05/2021   BUN 17 12/05/2021   NA 142 12/05/2021   K 4.0 12/05/2021   CL 100 12/05/2021   CO2 27 12/05/2021   Lab Results  Component Value Date   ALT 9 12/05/2021   AST 17 12/05/2021   ALKPHOS 100 12/05/2021   BILITOT 0.5 12/05/2021   Lab Results  Component Value Date   HGBA1C 5.4 12/05/2021   HGBA1C 5.5 08/29/2021   HGBA1C 5.7 (H) 04/14/2021   HGBA1C 5.8 (H) 11/08/2020   HGBA1C 5.6 05/06/2020   Lab Results  Component Value Date   INSULIN 9.6 12/05/2021   INSULIN 6.2 08/29/2021   INSULIN 12.4 04/14/2021   INSULIN 7.1 11/08/2020  INSULIN 9.1 05/06/2020   Lab Results  Component Value Date   TSH 2.040 04/14/2021   Lab Results  Component Value Date   CHOL 200 (H) 12/05/2021   HDL 64 12/05/2021   LDLCALC 126 (H) 12/05/2021   TRIG 56 12/05/2021   CHOLHDL 3.3 11/08/2020   Lab Results  Component Value Date   VD25OH 85.4 12/05/2021   VD25OH 93.5 08/29/2021   VD25OH 106.0 (H) 04/14/2021   Lab Results  Component Value Date   WBC 4.8 05/06/2020   HGB 13.6 05/06/2020   HCT 42.5 05/06/2020   MCV 84 05/06/2020   PLT 209 05/06/2020   No results found  for: "IRON", "TIBC", "FERRITIN"  Attestation Statements:   Reviewed by clinician on day of visit: allergies, medications, problem list, medical history, surgical history, family history, social history, and previous encounter notes.   I, Burt Knack, am acting as transcriptionist for Quillian Quince, MD.  I have reviewed the above documentation for accuracy and completeness, and I agree with the above. -  Quillian Quince, MD

## 2022-05-29 ENCOUNTER — Other Ambulatory Visit (HOSPITAL_COMMUNITY): Payer: Self-pay

## 2022-05-30 ENCOUNTER — Ambulatory Visit (INDEPENDENT_AMBULATORY_CARE_PROVIDER_SITE_OTHER): Payer: 59 | Admitting: Family Medicine

## 2022-05-30 ENCOUNTER — Other Ambulatory Visit (HOSPITAL_COMMUNITY): Payer: Self-pay

## 2022-05-30 VITALS — BP 126/83 | HR 63 | Temp 98.0°F | Ht 68.0 in | Wt 211.2 lb

## 2022-05-30 DIAGNOSIS — Z6833 Body mass index (BMI) 33.0-33.9, adult: Secondary | ICD-10-CM | POA: Diagnosis not present

## 2022-05-30 DIAGNOSIS — I10 Essential (primary) hypertension: Secondary | ICD-10-CM

## 2022-05-30 DIAGNOSIS — E669 Obesity, unspecified: Secondary | ICD-10-CM

## 2022-05-30 DIAGNOSIS — E7800 Pure hypercholesterolemia, unspecified: Secondary | ICD-10-CM | POA: Diagnosis not present

## 2022-05-30 MED ORDER — SAXENDA 18 MG/3ML ~~LOC~~ SOPN
3.0000 mg | PEN_INJECTOR | Freq: Every day | SUBCUTANEOUS | 0 refills | Status: DC
  Filled 2022-05-30 – 2022-06-22 (×2): qty 15, 30d supply, fill #0

## 2022-05-30 MED ORDER — CHLORTHALIDONE 25 MG PO TABS
25.0000 mg | ORAL_TABLET | Freq: Every day | ORAL | 0 refills | Status: DC
  Filled 2022-05-30 – 2022-06-22 (×2): qty 30, 30d supply, fill #0
  Filled 2022-07-28: qty 30, 30d supply, fill #1
  Filled 2022-08-29: qty 30, 30d supply, fill #2

## 2022-05-30 MED ORDER — METOPROLOL SUCCINATE ER 25 MG PO TB24
25.0000 mg | ORAL_TABLET | Freq: Every day | ORAL | 0 refills | Status: DC
  Filled 2022-05-31: qty 30, 30d supply, fill #0
  Filled 2022-06-26: qty 30, 30d supply, fill #1
  Filled 2022-07-28: qty 30, 30d supply, fill #2

## 2022-05-31 ENCOUNTER — Other Ambulatory Visit (HOSPITAL_COMMUNITY): Payer: Self-pay

## 2022-06-07 NOTE — Progress Notes (Signed)
Chief Complaint:   OBESITY Leslie Mason is here to discuss her progress with her obesity treatment plan along with follow-up of her obesity related diagnoses. Leslie Mason is on the Category 2 Plan and states she is following her eating plan approximately 85% of the time. Leslie Mason states she is walking for 30-45 minutes 7 times per week.  Today's visit was #: 36 Starting weight: 254 lbs Starting date: 05/06/2020 Today's weight: 211 lbs Today's date: 05/30/2022 Total lbs lost to date: 43 Total lbs lost since last in-office visit: 7  Interim History: Leslie Mason has done very well with weight loss, especially over Thanksgiving.  Her hunger is controlled, but she is working on meeting her protein goals.  Subjective:   1. Pure hypercholesterolemia Leslie Mason's last LDL was above goal.  She is working on her diet, and she denies chest pain.  2. Essential hypertension Leslie Mason's blood pressure is well controlled on her medications and with weight loss.  She has no signs of hypotension.  Assessment/Plan:   1. Pure hypercholesterolemia Leslie Mason was educated today on the importance  and specific details of diet to control her cholesterol levels. She will continue with her diet and weight loss, and we will recheck labs in 1 month.  2. Essential hypertension Leslie Mason will continue her medications, and we will refill chlorthalidone and metoprolol for 90 days.  - chlorthalidone (HYGROTON) 25 MG tablet; Take 1 tablet (25 mg total) by mouth daily.  Dispense: 90 tablet; Refill: 0 - metoprolol succinate (TOPROL-XL) 25 MG 24 hr tablet; Take 1 tablet (25 mg total) by mouth daily.  Dispense: 90 tablet; Refill: 0  3. Obesity, Current BMI 33.1 Leslie Mason is currently in the action stage of change. As such, her goal is to continue with weight loss efforts. She has agreed to the Category 2 Plan.   Leslie Mason will continue Saxenda 3 mg once daily, and we will refill for 1 month; (patient is to take at 1.2 mg daily for  now).  - Liraglutide -Weight Management (SAXENDA) 18 MG/3ML SOPN; Inject 3 mg into the skin daily.  Dispense: 15 mL; Refill: 0  Exercise goals: As is.   Behavioral modification strategies: increasing lean protein intake.  Leslie Mason has agreed to follow-up with our clinic in 5 weeks. She was informed of the importance of frequent follow-up visits to maximize her success with intensive lifestyle modifications for her multiple health conditions.   Objective:   Blood pressure 126/83, pulse 63, temperature 98 F (36.7 C), height 5\' 8"  (1.727 m), weight 211 lb 3.2 oz (95.8 kg), SpO2 100 %. Body mass index is 32.11 kg/m.  General: Cooperative, alert, well developed, in no acute distress. HEENT: Conjunctivae and lids unremarkable. Cardiovascular: Regular rhythm.  Lungs: Normal work of breathing. Neurologic: No focal deficits.   Lab Results  Component Value Date   CREATININE 1.13 (H) 12/05/2021   BUN 17 12/05/2021   NA 142 12/05/2021   K 4.0 12/05/2021   CL 100 12/05/2021   CO2 27 12/05/2021   Lab Results  Component Value Date   ALT 9 12/05/2021   AST 17 12/05/2021   ALKPHOS 100 12/05/2021   BILITOT 0.5 12/05/2021   Lab Results  Component Value Date   HGBA1C 5.4 12/05/2021   HGBA1C 5.5 08/29/2021   HGBA1C 5.7 (H) 04/14/2021   HGBA1C 5.8 (H) 11/08/2020   HGBA1C 5.6 05/06/2020   Lab Results  Component Value Date   INSULIN 9.6 12/05/2021   INSULIN 6.2 08/29/2021   INSULIN 12.4 04/14/2021  INSULIN 7.1 11/08/2020   INSULIN 9.1 05/06/2020   Lab Results  Component Value Date   TSH 2.040 04/14/2021   Lab Results  Component Value Date   CHOL 200 (H) 12/05/2021   HDL 64 12/05/2021   LDLCALC 126 (H) 12/05/2021   TRIG 56 12/05/2021   CHOLHDL 3.3 11/08/2020   Lab Results  Component Value Date   VD25OH 85.4 12/05/2021   VD25OH 93.5 08/29/2021   VD25OH 106.0 (H) 04/14/2021   Lab Results  Component Value Date   WBC 4.8 05/06/2020   HGB 13.6 05/06/2020   HCT 42.5  05/06/2020   MCV 84 05/06/2020   PLT 209 05/06/2020   No results found for: "IRON", "TIBC", "FERRITIN"  Attestation Statements:   Reviewed by clinician on day of visit: allergies, medications, problem list, medical history, surgical history, family history, social history, and previous encounter notes.  I have personally spent 43 minutes total time today in preparation, patient care, and documentation for this visit, including the following: review of clinical lab tests; review of medical tests/procedures/services.  I, Burt Knack, am acting as transcriptionist for Quillian Quince, MD.  I have reviewed the above documentation for accuracy and completeness, and I agree with the above. -  Quillian Quince, MD

## 2022-06-21 ENCOUNTER — Other Ambulatory Visit (HOSPITAL_COMMUNITY): Payer: Self-pay

## 2022-06-22 ENCOUNTER — Other Ambulatory Visit: Payer: Self-pay

## 2022-06-22 ENCOUNTER — Other Ambulatory Visit (HOSPITAL_COMMUNITY): Payer: Self-pay

## 2022-06-28 ENCOUNTER — Other Ambulatory Visit (HOSPITAL_COMMUNITY): Payer: Self-pay

## 2022-07-04 ENCOUNTER — Encounter (INDEPENDENT_AMBULATORY_CARE_PROVIDER_SITE_OTHER): Payer: Self-pay | Admitting: Family Medicine

## 2022-07-04 ENCOUNTER — Ambulatory Visit (INDEPENDENT_AMBULATORY_CARE_PROVIDER_SITE_OTHER): Payer: 59 | Admitting: Family Medicine

## 2022-07-04 ENCOUNTER — Other Ambulatory Visit (HOSPITAL_COMMUNITY): Payer: Self-pay

## 2022-07-04 VITALS — BP 120/75 | HR 65 | Temp 97.7°F | Ht 68.0 in | Wt 208.0 lb

## 2022-07-04 DIAGNOSIS — R7303 Prediabetes: Secondary | ICD-10-CM

## 2022-07-04 DIAGNOSIS — Z6831 Body mass index (BMI) 31.0-31.9, adult: Secondary | ICD-10-CM | POA: Diagnosis not present

## 2022-07-04 DIAGNOSIS — E669 Obesity, unspecified: Secondary | ICD-10-CM | POA: Diagnosis not present

## 2022-07-04 MED ORDER — BD PEN NEEDLE NANO U/F 32G X 4 MM MISC
1.0000 [IU] | 0 refills | Status: DC
  Filled 2022-07-04: qty 100, 90d supply, fill #0

## 2022-07-06 ENCOUNTER — Other Ambulatory Visit (HOSPITAL_COMMUNITY): Payer: Self-pay

## 2022-07-07 ENCOUNTER — Other Ambulatory Visit (HOSPITAL_COMMUNITY): Payer: Self-pay

## 2022-07-11 ENCOUNTER — Encounter: Payer: Self-pay | Admitting: Plastic Surgery

## 2022-07-11 ENCOUNTER — Ambulatory Visit (INDEPENDENT_AMBULATORY_CARE_PROVIDER_SITE_OTHER): Payer: 59 | Admitting: Plastic Surgery

## 2022-07-11 VITALS — BP 116/76 | HR 68 | Ht 68.5 in | Wt 213.2 lb

## 2022-07-11 DIAGNOSIS — E881 Lipodystrophy, not elsewhere classified: Secondary | ICD-10-CM | POA: Diagnosis not present

## 2022-07-11 DIAGNOSIS — R7303 Prediabetes: Secondary | ICD-10-CM

## 2022-07-11 DIAGNOSIS — G4733 Obstructive sleep apnea (adult) (pediatric): Secondary | ICD-10-CM

## 2022-07-11 DIAGNOSIS — Z9884 Bariatric surgery status: Secondary | ICD-10-CM | POA: Diagnosis not present

## 2022-07-11 NOTE — Progress Notes (Signed)
Patient ID: Leslie Mason, female    DOB: [birth year], 52 y.o.   MRN: [id number]   Chief Complaint  Patient presents with   Advice Only    The patient is a 52 year old female here for evaluation of her legs.  She is 5 feet 8 inches tall weighs 213 pounds.  In 2018 she underwent a gastric sleeve surgery by Dr. Andrey Campanile.  She went from being diabetic to prediabetic and her hemoglobin A1c was 5.4 in June 2023.  She is not a smoker and not on any blood thinners.  Her weight went from 340 pounds to 213 pounds.  Her goal is around 185 pounds.  Past medical history is listed below and includes back pain, eczema, asthma, hypertension and prediabetes.  Past surgical history includes ankle surgery and shoulder surgery.  She is also had a gastric surgery.  She is interested in reducing the size of her thighs.     Review of Systems  Constitutional:  Positive for activity change. Negative for appetite change.  HENT: Negative.    Eyes: Negative.   Respiratory: Negative.  Negative for chest tightness and shortness of breath.   Cardiovascular: Negative.   Gastrointestinal: Negative.   Endocrine: Negative.   Genitourinary: Negative.   Musculoskeletal: Negative.     Past Medical History:  Diagnosis Date   Asthma    Blood glucose elevated    Chewing difficulty    Chronic bronchitis (HCC)    Chronic low back pain without sciatica    Eczema    Family history of adverse reaction to anesthesia    mother has hx of seizures, anesthesia made seizures more freuqeunt, had to be monitored inhospital longer ; procedure wss to remove a" polyp from her stomach or something"    Fatigue    Hay fever    Hypertension    Pre-diabetes    Shortness of breath on exertion    Sleep apnea    cpap use regular    Swelling of lower extremity    TMJ (dislocation of temporomandibular joint)     Past Surgical History:  Procedure Laterality Date   ANKLE FRACTURE SURGERY Right    x3 , also achilles heel problems     GINGIVECTOMY     early 2000s   LAPAROSCOPIC GASTRIC SLEEVE RESECTION N/A 06/04/2017   Procedure: LAPAROSCOPIC GASTRIC SLEEVE RESECTION WITH UPPER ENDO, HIATAL HERNIA REPAIR;  Surgeon: Gaynelle Adu, MD;  Location: [facility];  Service: General;  Laterality: N/A;   SHOULDER SURGERY Right 2002   rotator cuff repair       Current Outpatient Medications:    albuterol (PROVENTIL HFA;VENTOLIN HFA) 108 (90 Base) MCG/ACT inhaler, Inhale 2 puffs into the lungs every 6 (six) hours as needed for wheezing or shortness of breath., Disp: , Rfl:    buPROPion (WELLBUTRIN SR) 200 MG 12 hr tablet, Take 1 tablet (200 mg total) by mouth daily., Disp: 90 tablet, Rfl: 0   Calcium Carbonate (CALCIUM 600 PO), Take 1 tablet by mouth in the morning and at bedtime., Disp: , Rfl:    chlorthalidone (HYGROTON) 25 MG tablet, Take 1 tablet (25 mg total) by mouth daily., Disp: 90 tablet, Rfl: 0   clobetasol cream (TEMOVATE) 0.05 %, Apply 1 application topically daily as needed., Disp: , Rfl:    docusate sodium (COLACE) 100 MG capsule, Take 1 capsule (100 mg total) by mouth 2 (two) times daily., Disp: 60 capsule, Rfl: 0   Insulin Pen Needle (BD PEN NEEDLE NANO  U/F) 32G X 4 MM MISC, Use as directed, Disp: 100 each, Rfl: 0   Liraglutide -Weight Management (SAXENDA) 18 MG/3ML SOPN, Inject 3 mg into the skin daily., Disp: 15 mL, Rfl: 0   metoprolol succinate (TOPROL-XL) 25 MG 24 hr tablet, Take 1 tablet (25 mg total) by mouth daily., Disp: 90 tablet, Rfl: 0   Objective:   Vitals:   07/11/22 1024  BP: 116/76  Pulse: 68  SpO2: 97%    Physical Exam Vitals and nursing note reviewed.  Constitutional:      Appearance: Normal appearance.  HENT:     Head: Normocephalic and atraumatic.  Cardiovascular:     Rate and Rhythm: Normal rate.     Pulses: Normal pulses.  Pulmonary:     Effort: Pulmonary effort is normal.  Abdominal:     General: There is no distension.     Palpations: Abdomen is soft.  Musculoskeletal:         General: No swelling or deformity.  Skin:    General: Skin is warm.     Coloration: Skin is not jaundiced.     Findings: No bruising.  Neurological:     Mental Status: She is oriented to person, place, and time.  Psychiatric:        Mood and Affect: Mood normal.        Behavior: Behavior normal.        Thought Content: Thought content normal.        Judgment: Judgment normal.     Assessment & Plan:  Prediabetes  Obstructive sleep apnea syndrome, mild  Lipodystrophy  The patient is going to continue her weight loss journey and come and see [place] in about 5 or 6 months.  Will reevaluate and we can talk more about the possibilities for surgery.  She is also interested in panniculectomy and we can talk more about that as well.  She has a job that she would likely miss 2 weeks for after surgery and then be able to go back up with light duty.  Pictures were obtained of the patient and placed in the chart with the patient's or guardian's permission.   [place], DO

## 2022-07-17 NOTE — Progress Notes (Signed)
Chief Complaint:   OBESITY Leslie Mason is here to discuss her progress with her obesity treatment plan along with follow-up of her obesity related diagnoses. Leslie Mason is on the Category 2 Plan and states she is following her eating plan approximately 95% of the time. Leslie Mason states she is walking for 45 minutes 4-5 times per week.  Today's visit was #: 31 Starting weight: 254 lbs Starting date: 05/06/2020 Today's weight: 208 lbs Today's date: 07/04/2022 Total lbs lost to date: 46 Total lbs lost since last in-office visit: 3  Interim History: Shadana continues to do well with weight loss.  She is working on following her category 2 plan.  Her hunger is controlled on Saxenda with no side effects noted.  Subjective:   1. Prediabetes Leslie Mason is working on controlling her glucose with her diet and her [place].  She denies nausea, vomiting, or constipation.  Assessment/Plan:   1. Prediabetes Leslie Mason will continue Saxenda, and we will refill Nano needles and will continue to monitor her progress.  2. Obesity, Current BMI 31.7 Leslie Mason will continue Saxenda, and we will refill Nano needles #100 to [facility] pharmacy.  - Insulin Pen Needle (BD PEN NEEDLE NANO U/F) 32G X 4 MM MISC; Use as directed  Dispense: 100 each; Refill: 0  Leslie Mason is currently in the action stage of change. As such, her goal is to continue with weight loss efforts. She has agreed to the Category 2 Plan.   Exercise goals: As is.   Behavioral modification strategies: increasing lean protein intake.  Leslie Mason has agreed to follow-up with our clinic in 3 to 4 weeks. She was informed of the importance of frequent follow-up visits to maximize her success with intensive lifestyle modifications for her multiple health conditions.   Objective:   Blood pressure 120/75, pulse 65, temperature 97.7 F (36.5 C), height 5\' 8"  (1.727 m), weight 208 lb (94.3 kg), SpO2 97 %. Body mass index is 31.63 kg/m.  General: Cooperative,  alert, well developed, in no acute distress. HEENT: Conjunctivae and lids unremarkable. Cardiovascular: Regular rhythm.  Lungs: Normal work of breathing. Neurologic: No focal deficits.   Lab Results  Component Value Date   CREATININE 1.13 (H) 12/05/2021   BUN 17 12/05/2021   NA 142 12/05/2021   K 4.0 12/05/2021   CL 100 12/05/2021   CO2 27 12/05/2021   Lab Results  Component Value Date   ALT 9 12/05/2021   AST 17 12/05/2021   ALKPHOS 100 12/05/2021   BILITOT 0.5 12/05/2021   Lab Results  Component Value Date   HGBA1C 5.4 12/05/2021   HGBA1C 5.5 08/29/2021   HGBA1C 5.7 (H) 04/14/2021   HGBA1C 5.8 (H) 11/08/2020   HGBA1C 5.6 05/06/2020   Lab Results  Component Value Date   INSULIN 9.6 12/05/2021   INSULIN 6.2 08/29/2021   INSULIN 12.4 04/14/2021   INSULIN 7.1 11/08/2020   INSULIN 9.1 05/06/2020   Lab Results  Component Value Date   TSH 2.040 04/14/2021   Lab Results  Component Value Date   CHOL 200 (H) 12/05/2021   HDL 64 12/05/2021   LDLCALC 126 (H) 12/05/2021   TRIG 56 12/05/2021   CHOLHDL 3.3 11/08/2020   Lab Results  Component Value Date   VD25OH 85.4 12/05/2021   VD25OH 93.5 08/29/2021   VD25OH 106.0 (H) 04/14/2021   Lab Results  Component Value Date   WBC 4.8 05/06/2020   HGB 13.6 05/06/2020   HCT 42.5 05/06/2020   MCV 84 05/06/2020  PLT 209 05/06/2020   No results found for: "IRON", "TIBC", "FERRITIN"  Attestation Statements:   Reviewed by clinician on day of visit: allergies, medications, problem list, medical history, surgical history, family history, social history, and previous encounter notes.  Time spent on visit including pre-visit chart review and post-visit care and charting was 30 minutes.   I, Trixie Dredge, am acting as transcriptionist for  Dennard Nip, MD.  I have reviewed the above documentation for accuracy and completeness, and I agree with the above. -  Dennard Nip, MD

## 2022-07-18 ENCOUNTER — Telehealth (INDEPENDENT_AMBULATORY_CARE_PROVIDER_SITE_OTHER): Payer: Self-pay | Admitting: Family Medicine

## 2022-07-18 NOTE — Telephone Encounter (Signed)
PA SUBMITTED FOR SAXENDA VIA [employer].   Leslie Mason (Key: [id number])  Your information has been submitted to [place]. To check for an updated outcome later, reopen this PA request from your dashboard.  If [employer] has not responded to your request within 24 hours, contact [place] at [phone]. If you think there may be a problem with your PA request, use our live chat feature at the bottom right.

## 2022-07-21 ENCOUNTER — Ambulatory Visit: Payer: 59 | Admitting: Psychologist

## 2022-08-01 ENCOUNTER — Ambulatory Visit (INDEPENDENT_AMBULATORY_CARE_PROVIDER_SITE_OTHER): Payer: 59 | Admitting: Family Medicine

## 2022-08-01 ENCOUNTER — Other Ambulatory Visit (HOSPITAL_COMMUNITY): Payer: Self-pay

## 2022-08-01 ENCOUNTER — Encounter (INDEPENDENT_AMBULATORY_CARE_PROVIDER_SITE_OTHER): Payer: Self-pay | Admitting: Family Medicine

## 2022-08-01 VITALS — BP 115/78 | HR 78 | Temp 98.3°F | Ht 68.0 in | Wt 207.0 lb

## 2022-08-01 DIAGNOSIS — E669 Obesity, unspecified: Secondary | ICD-10-CM | POA: Diagnosis not present

## 2022-08-01 DIAGNOSIS — I10 Essential (primary) hypertension: Secondary | ICD-10-CM | POA: Diagnosis not present

## 2022-08-01 DIAGNOSIS — K5909 Other constipation: Secondary | ICD-10-CM

## 2022-08-01 DIAGNOSIS — Z6831 Body mass index (BMI) 31.0-31.9, adult: Secondary | ICD-10-CM | POA: Diagnosis not present

## 2022-08-01 DIAGNOSIS — Z6832 Body mass index (BMI) 32.0-32.9, adult: Secondary | ICD-10-CM | POA: Insufficient documentation

## 2022-08-01 DIAGNOSIS — Z6830 Body mass index (BMI) 30.0-30.9, adult: Secondary | ICD-10-CM | POA: Insufficient documentation

## 2022-08-01 MED ORDER — SAXENDA 18 MG/3ML ~~LOC~~ SOPN
3.0000 mg | PEN_INJECTOR | Freq: Every day | SUBCUTANEOUS | 0 refills | Status: DC
  Filled 2022-08-01 – 2022-08-17 (×2): qty 15, 30d supply, fill #0

## 2022-08-04 ENCOUNTER — Encounter (INDEPENDENT_AMBULATORY_CARE_PROVIDER_SITE_OTHER): Payer: Self-pay | Admitting: Family Medicine

## 2022-08-04 ENCOUNTER — Other Ambulatory Visit (HOSPITAL_COMMUNITY): Payer: Self-pay

## 2022-08-04 DIAGNOSIS — F3289 Other specified depressive episodes: Secondary | ICD-10-CM

## 2022-08-04 DIAGNOSIS — I10 Essential (primary) hypertension: Secondary | ICD-10-CM

## 2022-08-15 NOTE — Progress Notes (Signed)
Chief Complaint:   OBESITY Leslie Mason is here to discuss her progress with her obesity treatment plan along with follow-up of her obesity related diagnoses. Leslie Mason is on the Category 2 Plan and states she is following her eating plan approximately 75% of the time. Leslie Mason states she is walking for 45 minutes 7 times per week.  Today's visit was #: 11 Starting weight: 254 lbs Starting date: 05/06/2020 Today's weight: 207 lbs Today's date: 08/01/2022 Total lbs lost to date: 43 Total lbs lost since last in-office visit: 1  Interim History: Leslie Mason continues to do well with weight loss. She traveled recently and she had extra challenges. She is working on increasing her water intake.   Subjective:   1. Essential hypertension Leslie Mason's blood pressure is stable on her medications with no side effects noted. She has no signs of hypotension.   2. Other constipation Leslie Mason notes BM every 3-5 days, with no significant improvement with Colace. She hasn't tried MiraLAX yet. She is showing signs that she has some encopresis.   Assessment/Plan:   1. Essential hypertension Leslie Mason will continue metoprolol and chlorthalidone, and we will refill as needed.   2. Other constipation Leslie Mason agreed to start OTC MiraLAX 17 grams daily, and increase her water intake. We will follow-up in 1 month.   3. BMI 31.0-31.9,adult  - Liraglutide -Weight Management (SAXENDA) 18 MG/3ML SOPN; Inject 3 mg into the skin daily.  Dispense: 15 mL; Refill: 0  4. Obesity, Beginning BMI 38.62 We will refill Saxenda for 1 month.   - Liraglutide -Weight Management (SAXENDA) 18 MG/3ML SOPN; Inject 3 mg into the skin daily.  Dispense: 15 mL; Refill: 0  Leslie Mason is currently in the action stage of change. As such, her goal is to continue with weight loss efforts. She has agreed to the Category 2 Plan.   Exercise goals: As is.   Behavioral modification strategies: increasing water intake.  Leslie Mason has agreed to  follow-up with our clinic in 4 weeks. She was informed of the importance of frequent follow-up visits to maximize her success with intensive lifestyle modifications for her multiple health conditions.   Objective:   Blood pressure 115/78, pulse 78, temperature 98.3 F (36.8 C), height 5' 8"$  (1.727 m), weight 207 lb (93.9 kg), SpO2 100 %. Body mass index is 31.47 kg/m.  General: Cooperative, alert, well developed, in no acute distress. HEENT: Conjunctivae and lids unremarkable. Cardiovascular: Regular rhythm.  Lungs: Normal work of breathing. Neurologic: No focal deficits.   Lab Results  Component Value Date   CREATININE 1.13 (H) 12/05/2021   BUN 17 12/05/2021   NA 142 12/05/2021   K 4.0 12/05/2021   CL 100 12/05/2021   CO2 27 12/05/2021   Lab Results  Component Value Date   ALT 9 12/05/2021   AST 17 12/05/2021   ALKPHOS 100 12/05/2021   BILITOT 0.5 12/05/2021   Lab Results  Component Value Date   HGBA1C 5.4 12/05/2021   HGBA1C 5.5 08/29/2021   HGBA1C 5.7 (H) 04/14/2021   HGBA1C 5.8 (H) 11/08/2020   HGBA1C 5.6 05/06/2020   Lab Results  Component Value Date   INSULIN 9.6 12/05/2021   INSULIN 6.2 08/29/2021   INSULIN 12.4 04/14/2021   INSULIN 7.1 11/08/2020   INSULIN 9.1 05/06/2020   Lab Results  Component Value Date   TSH 2.040 04/14/2021   Lab Results  Component Value Date   CHOL 200 (H) 12/05/2021   HDL 64 12/05/2021   LDLCALC 126 (H)  12/05/2021   TRIG 56 12/05/2021   CHOLHDL 3.3 11/08/2020   Lab Results  Component Value Date   VD25OH 85.4 12/05/2021   VD25OH 93.5 08/29/2021   VD25OH 106.0 (H) 04/14/2021   Lab Results  Component Value Date   WBC 4.8 05/06/2020   HGB 13.6 05/06/2020   HCT 42.5 05/06/2020   MCV 84 05/06/2020   PLT 209 05/06/2020   No results found for: "IRON", "TIBC", "FERRITIN"  Attestation Statements:   Reviewed by clinician on day of visit: allergies, medications, problem list, medical history, surgical history, family  history, social history, and previous encounter notes.  I have personally spent 45 minutes total time today in preparation, patient care, and documentation for this visit, including the following: review of clinical lab tests; review of medical tests/procedures/services.  I, Trixie Dredge, am acting as transcriptionist for Dennard Nip, MD.  I have reviewed the above documentation for accuracy and completeness, and I agree with the above. -  Dennard Nip, MD

## 2022-08-17 ENCOUNTER — Other Ambulatory Visit (HOSPITAL_COMMUNITY): Payer: Self-pay

## 2022-08-18 ENCOUNTER — Other Ambulatory Visit (HOSPITAL_COMMUNITY): Payer: Self-pay

## 2022-08-22 ENCOUNTER — Ambulatory Visit (INDEPENDENT_AMBULATORY_CARE_PROVIDER_SITE_OTHER): Payer: 59 | Admitting: Family Medicine

## 2022-08-29 ENCOUNTER — Ambulatory Visit (INDEPENDENT_AMBULATORY_CARE_PROVIDER_SITE_OTHER): Payer: 59 | Admitting: Family Medicine

## 2022-08-29 ENCOUNTER — Encounter (INDEPENDENT_AMBULATORY_CARE_PROVIDER_SITE_OTHER): Payer: Self-pay | Admitting: Family Medicine

## 2022-08-29 ENCOUNTER — Other Ambulatory Visit (HOSPITAL_COMMUNITY): Payer: Self-pay

## 2022-08-29 ENCOUNTER — Other Ambulatory Visit (INDEPENDENT_AMBULATORY_CARE_PROVIDER_SITE_OTHER): Payer: Self-pay | Admitting: Physician Assistant

## 2022-08-29 ENCOUNTER — Other Ambulatory Visit (INDEPENDENT_AMBULATORY_CARE_PROVIDER_SITE_OTHER): Payer: Self-pay | Admitting: Family Medicine

## 2022-08-29 VITALS — BP 115/77 | HR 63 | Temp 98.0°F | Ht 68.0 in | Wt 205.0 lb

## 2022-08-29 DIAGNOSIS — R7303 Prediabetes: Secondary | ICD-10-CM

## 2022-08-29 DIAGNOSIS — E7849 Other hyperlipidemia: Secondary | ICD-10-CM | POA: Insufficient documentation

## 2022-08-29 DIAGNOSIS — E559 Vitamin D deficiency, unspecified: Secondary | ICD-10-CM | POA: Diagnosis not present

## 2022-08-29 DIAGNOSIS — D509 Iron deficiency anemia, unspecified: Secondary | ICD-10-CM | POA: Insufficient documentation

## 2022-08-29 DIAGNOSIS — F3289 Other specified depressive episodes: Secondary | ICD-10-CM

## 2022-08-29 DIAGNOSIS — I10 Essential (primary) hypertension: Secondary | ICD-10-CM

## 2022-08-29 DIAGNOSIS — Z6831 Body mass index (BMI) 31.0-31.9, adult: Secondary | ICD-10-CM

## 2022-08-29 DIAGNOSIS — E669 Obesity, unspecified: Secondary | ICD-10-CM

## 2022-08-29 MED ORDER — METOPROLOL SUCCINATE ER 25 MG PO TB24: 25.0000 mg | ORAL_TABLET | Freq: Every day | ORAL | 0 refills | Status: DC

## 2022-08-29 MED ORDER — SAXENDA 18 MG/3ML ~~LOC~~ SOPN: 3.0000 mg | PEN_INJECTOR | Freq: Every day | SUBCUTANEOUS | 0 refills | Status: DC

## 2022-08-29 MED ORDER — METOPROLOL SUCCINATE ER 25 MG PO TB24
25.0000 mg | ORAL_TABLET | Freq: Every day | ORAL | 0 refills | Status: DC
  Filled 2022-08-29: qty 34, 34d supply, fill #0
  Filled 2022-10-06 – 2022-11-06 (×2): qty 34, 34d supply, fill #1
  Filled 2023-01-05 – 2023-03-22 (×2): qty 22, 22d supply, fill #2

## 2022-08-29 MED ORDER — BUPROPION HCL ER (SR) 200 MG PO TB12: 200.0000 mg | ORAL_TABLET | Freq: Every day | ORAL | 0 refills | Status: DC

## 2022-08-29 MED ORDER — CHLORTHALIDONE 25 MG PO TABS: 25.0000 mg | ORAL_TABLET | Freq: Every day | ORAL | 0 refills | Status: DC

## 2022-08-30 ENCOUNTER — Other Ambulatory Visit (HOSPITAL_COMMUNITY): Payer: Self-pay

## 2022-08-30 LAB — IRON AND TIBC
Iron Saturation: 27 % (ref 15–55)
Iron: 81 ug/dL (ref 27–159)
Total Iron Binding Capacity: 295 ug/dL (ref 250–450)
UIBC: 214 ug/dL (ref 131–425)

## 2022-08-30 LAB — CMP14+EGFR
ALT: 10 IU/L (ref 0–32)
AST: 16 IU/L (ref 0–40)
Albumin/Globulin Ratio: 1.3 (ref 1.2–2.2)
Albumin: 4.2 g/dL (ref 3.8–4.9)
Alkaline Phosphatase: 110 IU/L (ref 44–121)
BUN/Creatinine Ratio: 14 (ref 9–23)
BUN: 16 mg/dL (ref 6–24)
Bilirubin Total: 0.4 mg/dL (ref 0.0–1.2)
CO2: 27 mmol/L (ref 20–29)
Calcium: 10.5 mg/dL — ABNORMAL HIGH (ref 8.7–10.2)
Chloride: 98 mmol/L (ref 96–106)
Creatinine, Ser: 1.16 mg/dL — ABNORMAL HIGH (ref 0.57–1.00)
Globulin, Total: 3.2 g/dL (ref 1.5–4.5)
Glucose: 75 mg/dL (ref 70–99)
Potassium: 4.1 mmol/L (ref 3.5–5.2)
Sodium: 140 mmol/L (ref 134–144)
Total Protein: 7.4 g/dL (ref 6.0–8.5)
eGFR: 57 mL/min/{1.73_m2} — ABNORMAL LOW (ref >59)

## 2022-08-30 LAB — CBC WITH DIFFERENTIAL/PLATELET
Basophils Absolute: 0 10*3/uL (ref 0.0–0.2)
Basos: 1 %
EOS (ABSOLUTE): 0.3 10*3/uL (ref 0.0–0.4)
Eos: 4 %
Hematocrit: 41.7 % (ref 34.0–46.6)
Hemoglobin: 12.7 g/dL (ref 11.1–15.9)
Immature Grans (Abs): 0.1 10*3/uL (ref 0.0–0.1)
Immature Granulocytes: 1 %
Lymphocytes Absolute: 1.7 10*3/uL (ref 0.7–3.1)
Lymphs: 23 %
MCH: 25.5 pg — ABNORMAL LOW (ref 26.6–33.0)
MCHC: 30.5 g/dL — ABNORMAL LOW (ref 31.5–35.7)
MCV: 84 fL (ref 79–97)
Monocytes Absolute: 0.5 10*3/uL (ref 0.1–0.9)
Monocytes: 7 %
Neutrophils Absolute: 4.8 10*3/uL (ref 1.4–7.0)
Neutrophils: 64 %
Platelets: 268 10*3/uL (ref 150–450)
RBC: 4.99 x10E6/uL (ref 3.77–5.28)
RDW: 13.1 % (ref 11.7–15.4)
WBC: 7.3 10*3/uL (ref 3.4–10.8)

## 2022-08-30 LAB — LIPID PANEL WITH LDL/HDL RATIO
Cholesterol, Total: 198 mg/dL (ref 100–199)
HDL: 59 mg/dL (ref >39)
LDL Chol Calc (NIH): 124 mg/dL — ABNORMAL HIGH (ref 0–99)
LDL/HDL Ratio: 2.1 ratio (ref 0.0–3.2)
Triglycerides: 81 mg/dL (ref 0–149)
VLDL Cholesterol Cal: 15 mg/dL (ref 5–40)

## 2022-08-30 LAB — HEMOGLOBIN A1C
Est. average glucose Bld gHb Est-mCnc: 108 mg/dL
Hgb A1c MFr Bld: 5.4 % (ref 4.8–5.6)

## 2022-08-30 LAB — INSULIN, RANDOM: INSULIN: 24.2 u[IU]/mL (ref 2.6–24.9)

## 2022-08-30 LAB — FOLATE: Folate: 4.4 ng/mL (ref >3.0)

## 2022-08-30 LAB — VITAMIN D 25 HYDROXY (VIT D DEFICIENCY, FRACTURES): Vit D, 25-Hydroxy: 64.8 ng/mL (ref 30.0–100.0)

## 2022-08-30 LAB — FERRITIN: Ferritin: 312 ng/mL — ABNORMAL HIGH (ref 15–150)

## 2022-08-31 ENCOUNTER — Other Ambulatory Visit (HOSPITAL_COMMUNITY): Payer: Self-pay

## 2022-09-14 ENCOUNTER — Other Ambulatory Visit (INDEPENDENT_AMBULATORY_CARE_PROVIDER_SITE_OTHER): Payer: Self-pay | Admitting: Family Medicine

## 2022-09-14 DIAGNOSIS — D509 Iron deficiency anemia, unspecified: Secondary | ICD-10-CM

## 2022-09-14 DIAGNOSIS — Z6831 Body mass index (BMI) 31.0-31.9, adult: Secondary | ICD-10-CM

## 2022-09-18 ENCOUNTER — Other Ambulatory Visit (INDEPENDENT_AMBULATORY_CARE_PROVIDER_SITE_OTHER): Payer: Self-pay | Admitting: Family Medicine

## 2022-09-18 DIAGNOSIS — D509 Iron deficiency anemia, unspecified: Secondary | ICD-10-CM

## 2022-09-18 DIAGNOSIS — Z6831 Body mass index (BMI) 31.0-31.9, adult: Secondary | ICD-10-CM

## 2022-09-18 NOTE — Progress Notes (Signed)
Chief Complaint:   OBESITY Leslie Mason is here to discuss her progress with her obesity treatment plan along with follow-up of her obesity related diagnoses. Leslie Mason is on the Category 2 Plan and states she is following her eating plan approximately 50% of the time. Leslie Mason states she is walking for 45 minutes 5 times per week.  Today's visit was #: 34 Starting weight: 254 lbs Starting date: 05/06/2020 Today's weight: 205 lbs Today's date: 08/29/2022 Total lbs lost to date: 58 Total lbs lost since last in-office visit: 2  Interim History: Leslie Mason continues to work on her diet and weight loss.  She is working on increasing protein and not skipping meals.  Her hunger is controlled but there is questions about if her [place] is covered under her insurance this year.  Subjective:   1. Prediabetes Leslie Mason is doing well with her diet, exercise, and weight loss.  She is due for labs today.  2. Essential hypertension Leslie Mason's blood pressure is well-controlled on her medications and with her diet.  No headaches or signs of hypotension were noted.  3. Other hyperlipidemia Leslie Mason is working on decreasing cholesterol in her diet, and she is not on statin.  4. Vitamin D deficiency Leslie Mason's last vitamin D level was close to over replaced, and she is due to have labs rechecked.  She is not on vitamin D currently.  5. Iron deficiency anemia, unspecified iron deficiency anemia type Leslie Mason has a history of iron deficiency anemia, and she asked to have an anemia panel drawn per her PCP's request.  6. Emotional Eating Behavior Leslie Mason is doing well with decreasing emotional eating behaviors.  No side effects were noted on Wellbutrin.  Assessment/Plan:   1. Prediabetes We will check labs today, and we will follow-up at [place] next visit.  - CMP14+EGFR - Insulin, random - Hemoglobin A1c  2. Essential hypertension We will check labs today.  Dawnel will continue her medications, and we  will refill chlorthalidone and Toprol XL for 1 month.  - chlorthalidone (HYGROTON) 25 MG tablet; Take 1 tablet (25 mg total) by mouth daily.  Dispense: 30 tablet; Refill: 0 - metoprolol succinate (TOPROL-XL) 25 MG 24 hr tablet; Take 1 tablet (25 mg total) by mouth daily.  Dispense: 30 tablet; Refill: 0  3. Other hyperlipidemia We will check labs today, and we will follow-up at [place] next visit.  - Lipid Panel With LDL/HDL Ratio  4. Vitamin D deficiency We will check labs today, and we will follow-up at [place] next visit.  - VITAMIN D 25 Hydroxy (Vit-D Deficiency, Fractures)  5. Iron deficiency anemia, unspecified iron deficiency anemia type We will check anemia panel today, and Zendayah is to follow-up with her PCP.  - CBC with Differential/Platelet - Folate - Iron and TIBC - Ferritin  6. Emotional Eating Behavior Leslie Mason will continue Wellbutrin SR, and we will refill for 1 month.  - buPROPion (WELLBUTRIN SR) 200 MG 12 hr tablet; Take 1 tablet (200 mg total) by mouth daily.  Dispense: 30 tablet; Refill: 0  7. BMI 31.0-31.9,adult  8. Obesity, Beginning BMI 38.62 Leslie Mason is currently in the action stage of change. As such, her goal is to continue with weight loss efforts. She has agreed to the Category 2 Plan.   We will refill Saxenda 3 mg once daily.   Exercise goals: As is.   Behavioral modification strategies: increasing lean protein intake.  Leslie Mason has agreed to follow-up with our clinic in 4 weeks. She was informed of  the importance of frequent follow-up visits to maximize her success with intensive lifestyle modifications for her multiple health conditions.   Leslie Mason was informed we would discuss her lab results at her next visit unless there is a critical issue that needs to be addressed sooner. Leslie Mason agreed to keep her next visit at the agreed upon time to discuss these results.  Objective:   Blood pressure 115/77, pulse 63, temperature 98 F (36.7 C),  height 5\' 8"  (1.727 m), weight 205 lb (93 kg), SpO2 96 %. Body mass index is 31.17 kg/m.  Lab Results  Component Value Date   CREATININE 1.16 (H) 08/29/2022   BUN 16 08/29/2022   NA 140 08/29/2022   K 4.1 08/29/2022   CL 98 08/29/2022   CO2 27 08/29/2022   Lab Results  Component Value Date   ALT 10 08/29/2022   AST 16 08/29/2022   ALKPHOS 110 08/29/2022   BILITOT 0.4 08/29/2022   Lab Results  Component Value Date   HGBA1C 5.4 08/29/2022   HGBA1C 5.4 12/05/2021   HGBA1C 5.5 08/29/2021   HGBA1C 5.7 (H) 04/14/2021   HGBA1C 5.8 (H) 11/08/2020   Lab Results  Component Value Date   INSULIN 24.2 08/29/2022   INSULIN 9.6 12/05/2021   INSULIN 6.2 08/29/2021   INSULIN 12.4 04/14/2021   INSULIN 7.1 11/08/2020   Lab Results  Component Value Date   TSH 2.040 04/14/2021   Lab Results  Component Value Date   CHOL 198 08/29/2022   HDL 59 08/29/2022   LDLCALC 124 (H) 08/29/2022   TRIG 81 08/29/2022   CHOLHDL 3.3 11/08/2020   Lab Results  Component Value Date   VD25OH 64.8 08/29/2022   VD25OH 85.4 12/05/2021   VD25OH 93.5 08/29/2021   Lab Results  Component Value Date   WBC 7.3 08/29/2022   HGB 12.7 08/29/2022   HCT 41.7 08/29/2022   MCV 84 08/29/2022   PLT 268 08/29/2022   Lab Results  Component Value Date   IRON 81 08/29/2022   TIBC 295 08/29/2022   FERRITIN 312 (H) 08/29/2022   Attestation Statements:   Reviewed by clinician on day of visit: allergies, medications, problem list, medical history, surgical history, family history, social history, and previous encounter notes.  Time spent on visit including pre-visit chart review and post-visit care and charting was 40 minutes.   I, Trixie Dredge, am acting as transcriptionist for Dennard Nip, MD.  I have reviewed the above documentation for accuracy and completeness, and I agree with the above. -  Dennard Nip, MD

## 2022-09-28 ENCOUNTER — Other Ambulatory Visit (HOSPITAL_COMMUNITY): Payer: Self-pay

## 2022-09-28 ENCOUNTER — Other Ambulatory Visit (INDEPENDENT_AMBULATORY_CARE_PROVIDER_SITE_OTHER): Payer: Self-pay | Admitting: Family Medicine

## 2022-09-28 ENCOUNTER — Ambulatory Visit (INDEPENDENT_AMBULATORY_CARE_PROVIDER_SITE_OTHER): Payer: 59 | Admitting: Family Medicine

## 2022-09-28 ENCOUNTER — Encounter (INDEPENDENT_AMBULATORY_CARE_PROVIDER_SITE_OTHER): Payer: Self-pay | Admitting: Family Medicine

## 2022-09-28 VITALS — BP 106/70 | HR 69 | Temp 97.6°F | Ht 68.0 in | Wt 209.0 lb

## 2022-09-28 DIAGNOSIS — K5909 Other constipation: Secondary | ICD-10-CM | POA: Diagnosis not present

## 2022-09-28 DIAGNOSIS — R632 Polyphagia: Secondary | ICD-10-CM | POA: Diagnosis not present

## 2022-09-28 DIAGNOSIS — Z6831 Body mass index (BMI) 31.0-31.9, adult: Secondary | ICD-10-CM

## 2022-09-28 DIAGNOSIS — E669 Obesity, unspecified: Secondary | ICD-10-CM | POA: Diagnosis not present

## 2022-09-28 DIAGNOSIS — D509 Iron deficiency anemia, unspecified: Secondary | ICD-10-CM

## 2022-09-28 MED ORDER — SAXENDA 18 MG/3ML ~~LOC~~ SOPN
3.0000 mg | PEN_INJECTOR | Freq: Every day | SUBCUTANEOUS | 0 refills | Status: DC
  Filled 2022-09-28: qty 3, 6d supply, fill #0

## 2022-09-28 MED ORDER — POLYETHYLENE GLYCOL 3350 17 GM/SCOOP PO POWD
17.0000 g | Freq: Every day | ORAL | 0 refills | Status: AC
  Filled 2022-09-28: qty 510, 30d supply, fill #0

## 2022-09-29 ENCOUNTER — Other Ambulatory Visit (HOSPITAL_COMMUNITY): Payer: Self-pay

## 2022-10-02 NOTE — Progress Notes (Unsigned)
Chief Complaint:   OBESITY Leslie Mason is here to discuss her progress with her obesity treatment plan along with follow-up of her obesity related diagnoses. Leslie Mason is on the Category 2 Plan and states she is following her eating plan approximately 75% of the time. Mikeshia states she is walking for 40-45 minutes 7 times per week.  Today's visit was #: 36 Starting weight: 254 lbs Starting date: 05/06/2020 Today's weight: 209 lbs Today's date: 09/28/2022 Total lbs lost to date: 45 Total lbs lost since last in-office visit: 0  Interim History: Leslie Mason has been dealing with in creased stress and some increased comfort eating. She has been off her plan more recently, but she is getting back on track.   Subjective:   1. Other constipation Leslie Mason notes significant constipation in last 2 weeks.   2. Polyphagia Leslie Mason on Saxenda at 1.2 mg. She notes some increase in cravings recently.   Assessment/Plan:   1. Other constipation Leslie Mason agreed to start MiraLAX 17 grams with no refills, and increase her water intake.   - polyethylene glycol powder (GLYCOLAX/MIRALAX) 17 GM/SCOOP powder; Take 17 g by mouth daily for 30 doses.  Dispense: 510 g; Refill: 0  2. Polyphagia We will refill Saxenda 3 mg for 1 month (patient is to increase to 1.8 mg). We will follow-up in 4 weeks.   - Liraglutide -Weight Management (SAXENDA) 18 MG/3ML SOPN; Inject 3 mg into the skin daily.  Dispense: 3 mL; Refill: 0  3. BMI 31.0-31.9,adult  4. Obesity, Beginning BMI 38.62 Leslie Mason is currently in the action stage of change. As such, her goal is to continue with weight loss efforts. She has agreed to the Category 2 Plan.   Exercise goals: As is.   Behavioral modification strategies: increasing water intake.  Leslie Mason has agreed to follow-up with our clinic in 4 weeks. She was informed of the importance of frequent follow-up visits to maximize her success with intensive lifestyle modifications for her multiple  health conditions.   Objective:   Blood pressure 106/70, pulse 69, temperature 97.6 F (36.4 C), height 5\' 8"  (1.727 m), weight 209 lb (94.8 kg), SpO2 96 %. Body mass index is 31.78 kg/m.  Lab Results  Component Value Date   CREATININE 1.16 (H) 08/29/2022   BUN 16 08/29/2022   NA 140 08/29/2022   K 4.1 08/29/2022   CL 98 08/29/2022   CO2 27 08/29/2022   Lab Results  Component Value Date   ALT 10 08/29/2022   AST 16 08/29/2022   ALKPHOS 110 08/29/2022   BILITOT 0.4 08/29/2022   Lab Results  Component Value Date   HGBA1C 5.4 08/29/2022   HGBA1C 5.4 12/05/2021   HGBA1C 5.5 08/29/2021   HGBA1C 5.7 (H) 04/14/2021   HGBA1C 5.8 (H) 11/08/2020   Lab Results  Component Value Date   INSULIN 24.2 08/29/2022   INSULIN 9.6 12/05/2021   INSULIN 6.2 08/29/2021   INSULIN 12.4 04/14/2021   INSULIN 7.1 11/08/2020   Lab Results  Component Value Date   TSH 2.040 04/14/2021   Lab Results  Component Value Date   CHOL 198 08/29/2022   HDL 59 08/29/2022   LDLCALC 124 (H) 08/29/2022   TRIG 81 08/29/2022   CHOLHDL 3.3 11/08/2020   Lab Results  Component Value Date   VD25OH 64.8 08/29/2022   VD25OH 85.4 12/05/2021   VD25OH 93.5 08/29/2021   Lab Results  Component Value Date   WBC 7.3 08/29/2022   HGB 12.7 08/29/2022  HCT 41.7 08/29/2022   MCV 84 08/29/2022   PLT 268 08/29/2022   Lab Results  Component Value Date   IRON 81 08/29/2022   TIBC 295 08/29/2022   FERRITIN 312 (H) 08/29/2022   Attestation Statements:   Reviewed by clinician on day of visit: allergies, medications, problem list, medical history, surgical history, family history, social history, and previous encounter notes.    I, Trixie Dredge, am acting as transcriptionist for Dennard Nip, MD.  I have reviewed the above documentation for accuracy and completeness, and I agree with the above. -  Dennard Nip, MD

## 2022-10-06 ENCOUNTER — Other Ambulatory Visit (HOSPITAL_COMMUNITY): Payer: Self-pay

## 2022-10-06 ENCOUNTER — Encounter (INDEPENDENT_AMBULATORY_CARE_PROVIDER_SITE_OTHER): Payer: Self-pay | Admitting: Family Medicine

## 2022-10-26 ENCOUNTER — Ambulatory Visit (INDEPENDENT_AMBULATORY_CARE_PROVIDER_SITE_OTHER): Payer: 59 | Admitting: Family Medicine

## 2022-10-26 ENCOUNTER — Encounter (INDEPENDENT_AMBULATORY_CARE_PROVIDER_SITE_OTHER): Payer: Self-pay | Admitting: Family Medicine

## 2022-10-26 ENCOUNTER — Other Ambulatory Visit (HOSPITAL_COMMUNITY): Payer: Self-pay

## 2022-10-26 ENCOUNTER — Encounter (HOSPITAL_COMMUNITY): Payer: Self-pay

## 2022-10-26 VITALS — BP 119/84 | HR 73 | Temp 98.3°F | Ht 68.0 in | Wt 213.0 lb

## 2022-10-26 DIAGNOSIS — I10 Essential (primary) hypertension: Secondary | ICD-10-CM | POA: Diagnosis not present

## 2022-10-26 DIAGNOSIS — E669 Obesity, unspecified: Secondary | ICD-10-CM

## 2022-10-26 DIAGNOSIS — Z6832 Body mass index (BMI) 32.0-32.9, adult: Secondary | ICD-10-CM

## 2022-10-26 DIAGNOSIS — R632 Polyphagia: Secondary | ICD-10-CM

## 2022-10-26 DIAGNOSIS — R7989 Other specified abnormal findings of blood chemistry: Secondary | ICD-10-CM

## 2022-10-26 DIAGNOSIS — E7800 Pure hypercholesterolemia, unspecified: Secondary | ICD-10-CM

## 2022-10-26 MED ORDER — ZEPBOUND 7.5 MG/0.5ML ~~LOC~~ SOAJ
7.5000 mg | SUBCUTANEOUS | 0 refills | Status: DC
  Filled 2022-10-26: qty 2, 28d supply, fill #0

## 2022-10-27 ENCOUNTER — Other Ambulatory Visit (HOSPITAL_COMMUNITY): Payer: Self-pay

## 2022-10-28 ENCOUNTER — Other Ambulatory Visit (INDEPENDENT_AMBULATORY_CARE_PROVIDER_SITE_OTHER): Payer: Self-pay | Admitting: Family Medicine

## 2022-10-28 DIAGNOSIS — I10 Essential (primary) hypertension: Secondary | ICD-10-CM

## 2022-10-31 NOTE — Progress Notes (Signed)
Chief Complaint:   OBESITY Leslie Mason is here to discuss her progress with her obesity treatment plan along with follow-up of her obesity related diagnoses. Leslie Mason is on the Category 2 Plan and states she is following her eating plan approximately 65% of the time. Leslie Mason states she is walking for 45 minutes 1-2 times per week.  Today's visit was #: 41 Starting weight: 254 lbs Starting date: 05/06/2020 Today's weight: 212 lbs Today's date: 10/26/2022 Total lbs lost to date: 42 Total lbs lost since last in-office visit: 0  Interim History: Leslie Mason notes increased stress at work.  She is struggling to eat all of the protein on her plan.  She has stopped some meals and her RMR may be decreasing.  Subjective:   1. Polyphagia Leslie Mason increased Saxenda and she notes decreased hunger control.  She is unable to find [place] and she is interested in starting Zepbound.  2. Essential hypertension Leslie Mason's blood pressure is well-controlled on her medications, and she denies chest pain or lightheadedness.  I discussed labs with the patient today.  3. Elevated serum creatinine Leslie Mason's creatinine has been elevated for the last few labs.  She denies taking more NSAIDs, and her blood pressure is well-controlled.  I discussed labs with the patient today.  4. Pure hypercholesterolemia Leslie Mason's LDL is elevated but improving from 1 to 2 years ago.  She is not on a statin, and she denies chest pain.  Her HDL and triglycerides were within normal limits.  I discussed labs with the patient today.  Assessment/Plan:   1. Polyphagia Leslie Mason agreed to discontinue Saxenda, and start Zepbound at 7.5 mg once weekly with no refills.  - tirzepatide (ZEPBOUND) 7.5 MG/0.5ML Pen; Inject 7.5 mg into the skin once a week.  Dispense: 2 mL; Refill: 0  2. Essential hypertension Leslie Mason will continue Toprol-XL at 25 mg once daily, and will continue to work on her diet, exercise, and weight loss to improve her  blood pressure.  3. Elevated serum creatinine Leslie Mason is to discuss this at her upcoming PCP visit.  4. Pure hypercholesterolemia Leslie Mason will continue to follow a low-cholesterol diet and increase her exercise.  She was advised that she may need a statin as she ages.  5. BMI 32.0-32.9,adult  6. Obesity, Beginning BMI 38.62 Leslie Mason is currently in the action stage of change. As such, her goal is to continue with weight loss efforts. She has agreed to the Category 2 Plan.   Exercise goals: For substantial health benefits, adults should do at least 150 minutes (2 hours and 30 minutes) a week of moderate-intensity, or 75 minutes (1 hour and 15 minutes) a week of vigorous-intensity aerobic physical activity, or an equivalent combination of moderate- and vigorous-intensity aerobic activity. Aerobic activity should be performed in episodes of at least 10 minutes, and preferably, it should be spread throughout the week.  Behavioral modification strategies: increasing lean protein intake and no skipping meals.  Leslie Mason has agreed to follow-up with our clinic in 4 weeks. She was informed of the importance of frequent follow-up visits to maximize her success with intensive lifestyle modifications for her multiple health conditions.   Objective:   Blood pressure 119/84, pulse 73, temperature 98.3 F (36.8 C), height 5\' 8"  (1.727 m), weight 213 lb (96.6 kg), SpO2 99 %. Body mass index is 32.39 kg/m.  Lab Results  Component Value Date   CREATININE 1.16 (H) 08/29/2022   BUN 16 08/29/2022   NA 140 08/29/2022   K 4.1 08/29/2022  CL 98 08/29/2022   CO2 27 08/29/2022   Lab Results  Component Value Date   ALT 10 08/29/2022   AST 16 08/29/2022   ALKPHOS 110 08/29/2022   BILITOT 0.4 08/29/2022   Lab Results  Component Value Date   HGBA1C 5.4 08/29/2022   HGBA1C 5.4 12/05/2021   HGBA1C 5.5 08/29/2021   HGBA1C 5.7 (H) 04/14/2021   HGBA1C 5.8 (H) 11/08/2020   Lab Results  Component Value  Date   INSULIN 24.2 08/29/2022   INSULIN 9.6 12/05/2021   INSULIN 6.2 08/29/2021   INSULIN 12.4 04/14/2021   INSULIN 7.1 11/08/2020   Lab Results  Component Value Date   TSH 2.040 04/14/2021   Lab Results  Component Value Date   CHOL 198 08/29/2022   HDL 59 08/29/2022   LDLCALC 124 (H) 08/29/2022   TRIG 81 08/29/2022   CHOLHDL 3.3 11/08/2020   Lab Results  Component Value Date   VD25OH 64.8 08/29/2022   VD25OH 85.4 12/05/2021   VD25OH 93.5 08/29/2021   Lab Results  Component Value Date   WBC 7.3 08/29/2022   HGB 12.7 08/29/2022   HCT 41.7 08/29/2022   MCV 84 08/29/2022   PLT 268 08/29/2022   Lab Results  Component Value Date   IRON 81 08/29/2022   TIBC 295 08/29/2022   FERRITIN 312 (H) 08/29/2022   Attestation Statements:   Reviewed by clinician on day of visit: allergies, medications, problem list, medical history, surgical history, family history, social history, and previous encounter notes.   I, Burt Knack, am acting as transcriptionist for Quillian Quince, MD.  I have reviewed the above documentation for accuracy and completeness, and I agree with the above. -  Quillian Quince, MD

## 2022-11-02 ENCOUNTER — Encounter (INDEPENDENT_AMBULATORY_CARE_PROVIDER_SITE_OTHER): Payer: Self-pay | Admitting: Family Medicine

## 2022-11-02 ENCOUNTER — Telehealth (INDEPENDENT_AMBULATORY_CARE_PROVIDER_SITE_OTHER): Payer: Self-pay | Admitting: *Deleted

## 2022-11-02 NOTE — Telephone Encounter (Signed)
PA FOR ZEPBOUND SUBMITTED VIA [employer].  Leslie Mason (Key: [id number])   Your information has been submitted to [employer]. To check for an updated outcome later, reopen this PA request from your dashboard.  If [employer] has not responded to your request within 24 hours, contact [employer] at [phone]. If you think there may be a problem with your PA request, use our live chat feature at the bottom right.

## 2022-11-07 ENCOUNTER — Ambulatory Visit: Payer: 59 | Admitting: Plastic Surgery

## 2022-11-23 ENCOUNTER — Encounter (INDEPENDENT_AMBULATORY_CARE_PROVIDER_SITE_OTHER): Payer: Self-pay | Admitting: Family Medicine

## 2022-11-23 ENCOUNTER — Ambulatory Visit (INDEPENDENT_AMBULATORY_CARE_PROVIDER_SITE_OTHER): Payer: 59 | Admitting: Family Medicine

## 2022-11-23 VITALS — BP 109/72 | HR 63 | Temp 97.4°F | Ht 68.0 in | Wt 212.0 lb

## 2022-11-23 DIAGNOSIS — F439 Reaction to severe stress, unspecified: Secondary | ICD-10-CM | POA: Diagnosis not present

## 2022-11-23 DIAGNOSIS — Z6832 Body mass index (BMI) 32.0-32.9, adult: Secondary | ICD-10-CM | POA: Diagnosis not present

## 2022-11-23 DIAGNOSIS — G4709 Other insomnia: Secondary | ICD-10-CM

## 2022-11-23 DIAGNOSIS — E6609 Other obesity due to excess calories: Secondary | ICD-10-CM | POA: Diagnosis not present

## 2022-11-26 ENCOUNTER — Other Ambulatory Visit (INDEPENDENT_AMBULATORY_CARE_PROVIDER_SITE_OTHER): Payer: Self-pay | Admitting: Family Medicine

## 2022-11-26 DIAGNOSIS — F3289 Other specified depressive episodes: Secondary | ICD-10-CM

## 2022-11-28 NOTE — Progress Notes (Unsigned)
Chief Complaint:   OBESITY Leslie Mason is here to discuss her progress with her obesity treatment plan along with follow-up of her obesity related diagnoses. Leslie Mason is on the Category 2 Plan and states she is following her eating plan approximately 65% of the time. Leslie Mason states she is walking and doing abstract for 35-40 minutes 3 times per week.  Today's visit was #: 42 Starting weight: 254 lbs Starting date: 05/06/2020 Today's weight: 212 lbs Today's date: 11/23/2022 Total lbs lost to date: 42 Total lbs lost since last in-office visit: 1  Interim History: Patient continues to work on her weight loss.  She has had extra challenges, but she is still doing well with meeting her protein goals.  Subjective:   1. Other insomnia Patient notes insomnia has been worse in the last 2 weeks.  She wakes frequently and wakes unrefreshed.  2. Stress Patient has had multiple stressors lately, and this has also affected her sleep.  Assessment/Plan:   1. Other insomnia Patient is to start melatonin OTC 2 to 3 hours before bedtime.  2. Stress Stress reduction techniques were discussed.  Patient will continue Wellbutrin, and we will continue to monitor.  3. BMI 32.0-32.9,adult  4. Obesity, Beginning BMI 38.62 Leslie Mason is currently in the action stage of change. As such, her goal is to continue with weight loss efforts. She has agreed to the Category 2 Plan.   Exercise goals: As is.   Behavioral modification strategies: increasing lean protein intake and increasing vegetables.  Leslie Mason has agreed to follow-up with our clinic in 4 weeks. She was informed of the importance of frequent follow-up visits to maximize her success with intensive lifestyle modifications for her multiple health conditions.   Objective:   Blood pressure 109/72, pulse 63, temperature (!) 97.4 F (36.3 C), height 5\' 8"  (1.727 m), weight 212 lb (96.2 kg), SpO2 97 %. Body mass index is 32.23 kg/m.  Lab Results   Component Value Date   CREATININE 1.16 (H) 08/29/2022   BUN 16 08/29/2022   NA 140 08/29/2022   K 4.1 08/29/2022   CL 98 08/29/2022   CO2 27 08/29/2022   Lab Results  Component Value Date   ALT 10 08/29/2022   AST 16 08/29/2022   ALKPHOS 110 08/29/2022   BILITOT 0.4 08/29/2022   Lab Results  Component Value Date   HGBA1C 5.4 08/29/2022   HGBA1C 5.4 12/05/2021   HGBA1C 5.5 08/29/2021   HGBA1C 5.7 (H) 04/14/2021   HGBA1C 5.8 (H) 11/08/2020   Lab Results  Component Value Date   INSULIN 24.2 08/29/2022   INSULIN 9.6 12/05/2021   INSULIN 6.2 08/29/2021   INSULIN 12.4 04/14/2021   INSULIN 7.1 11/08/2020   Lab Results  Component Value Date   TSH 2.040 04/14/2021   Lab Results  Component Value Date   CHOL 198 08/29/2022   HDL 59 08/29/2022   LDLCALC 124 (H) 08/29/2022   TRIG 81 08/29/2022   CHOLHDL 3.3 11/08/2020   Lab Results  Component Value Date   VD25OH 64.8 08/29/2022   VD25OH 85.4 12/05/2021   VD25OH 93.5 08/29/2021   Lab Results  Component Value Date   WBC 7.3 08/29/2022   HGB 12.7 08/29/2022   HCT 41.7 08/29/2022   MCV 84 08/29/2022   PLT 268 08/29/2022   Lab Results  Component Value Date   IRON 81 08/29/2022   TIBC 295 08/29/2022   FERRITIN 312 (H) 08/29/2022   Attestation Statements:   Reviewed by  clinician on day of visit: allergies, medications, problem list, medical history, surgical history, family history, social history, and previous encounter notes.  Time spent on visit including pre-visit chart review and post-visit care and charting was 30 minutes.   I, Burt Knack, am acting as transcriptionist for Quillian Quince, MD.  I have reviewed the above documentation for accuracy and completeness, and I agree with the above. -  Quillian Quince, MD

## 2022-12-06 ENCOUNTER — Other Ambulatory Visit (INDEPENDENT_AMBULATORY_CARE_PROVIDER_SITE_OTHER): Payer: Self-pay | Admitting: Family Medicine

## 2022-12-06 DIAGNOSIS — F3289 Other specified depressive episodes: Secondary | ICD-10-CM

## 2022-12-07 ENCOUNTER — Other Ambulatory Visit (HOSPITAL_COMMUNITY): Payer: Self-pay

## 2022-12-07 MED ORDER — ALBUTEROL SULFATE HFA 108 (90 BASE) MCG/ACT IN AERS
2.0000 | INHALATION_SPRAY | Freq: Four times a day (QID) | RESPIRATORY_TRACT | 3 refills | Status: AC | PRN
  Filled 2022-12-07: qty 6.7, 25d supply, fill #0

## 2022-12-07 MED ORDER — METOPROLOL SUCCINATE ER 25 MG PO TB24
25.0000 mg | ORAL_TABLET | Freq: Every day | ORAL | 5 refills | Status: DC
  Filled 2022-12-07: qty 30, 30d supply, fill #0
  Filled 2023-01-05: qty 30, 30d supply, fill #1
  Filled 2023-02-04: qty 30, 30d supply, fill #2

## 2022-12-12 ENCOUNTER — Encounter (INDEPENDENT_AMBULATORY_CARE_PROVIDER_SITE_OTHER): Payer: Self-pay | Admitting: Family Medicine

## 2022-12-12 ENCOUNTER — Other Ambulatory Visit (INDEPENDENT_AMBULATORY_CARE_PROVIDER_SITE_OTHER): Payer: Self-pay | Admitting: Family Medicine

## 2022-12-12 DIAGNOSIS — F3289 Other specified depressive episodes: Secondary | ICD-10-CM

## 2022-12-13 ENCOUNTER — Other Ambulatory Visit (INDEPENDENT_AMBULATORY_CARE_PROVIDER_SITE_OTHER): Payer: Self-pay | Admitting: Family Medicine

## 2022-12-13 ENCOUNTER — Other Ambulatory Visit (HOSPITAL_COMMUNITY): Payer: Self-pay

## 2022-12-13 DIAGNOSIS — R632 Polyphagia: Secondary | ICD-10-CM

## 2022-12-14 ENCOUNTER — Other Ambulatory Visit (HOSPITAL_COMMUNITY): Payer: Self-pay

## 2022-12-21 ENCOUNTER — Ambulatory Visit (INDEPENDENT_AMBULATORY_CARE_PROVIDER_SITE_OTHER): Payer: 59 | Admitting: Family Medicine

## 2022-12-21 ENCOUNTER — Other Ambulatory Visit (HOSPITAL_COMMUNITY): Payer: Self-pay

## 2022-12-21 ENCOUNTER — Encounter (INDEPENDENT_AMBULATORY_CARE_PROVIDER_SITE_OTHER): Payer: Self-pay | Admitting: Family Medicine

## 2022-12-21 VITALS — BP 119/85 | HR 68 | Temp 97.5°F | Ht 68.0 in | Wt 210.0 lb

## 2022-12-21 DIAGNOSIS — Z6831 Body mass index (BMI) 31.0-31.9, adult: Secondary | ICD-10-CM

## 2022-12-21 DIAGNOSIS — I10 Essential (primary) hypertension: Secondary | ICD-10-CM | POA: Diagnosis not present

## 2022-12-21 DIAGNOSIS — R632 Polyphagia: Secondary | ICD-10-CM

## 2022-12-21 DIAGNOSIS — F3289 Other specified depressive episodes: Secondary | ICD-10-CM

## 2022-12-21 DIAGNOSIS — E669 Obesity, unspecified: Secondary | ICD-10-CM

## 2022-12-21 MED ORDER — BUPROPION HCL ER (SR) 200 MG PO TB12
200.0000 mg | ORAL_TABLET | Freq: Every day | ORAL | 0 refills | Status: DC
Start: 2022-12-21 — End: 2023-04-09
  Filled 2022-12-21: qty 30, 30d supply, fill #0
  Filled 2023-02-04: qty 30, 30d supply, fill #1
  Filled 2023-03-04: qty 30, 30d supply, fill #2

## 2022-12-21 MED ORDER — WEGOVY 1 MG/0.5ML ~~LOC~~ SOAJ
1.0000 mg | SUBCUTANEOUS | 0 refills | Status: DC
Start: 2022-12-21 — End: 2023-02-08
  Filled 2022-12-21 – 2022-12-26 (×2): qty 2, 28d supply, fill #0

## 2022-12-26 ENCOUNTER — Telehealth (INDEPENDENT_AMBULATORY_CARE_PROVIDER_SITE_OTHER): Payer: Self-pay

## 2022-12-26 ENCOUNTER — Other Ambulatory Visit (HOSPITAL_COMMUNITY): Payer: Self-pay

## 2022-12-26 NOTE — Telephone Encounter (Signed)
Message from plan: Your PA request has been approved. Additional information will be provided in the approval communication. (Message 1145). Authorization Expiration Date: December 26, 2023.

## 2022-12-26 NOTE — Telephone Encounter (Signed)
Prior Auth submitted via cover my meds for [facility].  Waiting for response.  Your information has been submitted to [employer]. To check for an updated outcome later, reopen this PA request from your dashboard.  If [employer] has not responded to your request within 24 hours, contact [employer] at [phone]. If you think there may be a problem with your PA request, use our live chat feature at the bottom right.

## 2022-12-26 NOTE — Progress Notes (Signed)
Chief Complaint:   OBESITY Leslie Mason is here to discuss her progress with her obesity treatment plan along with follow-up of her obesity related diagnoses. Leslie Mason is on the Category 2 Plan and states she is following her eating plan approximately 45% of the time. Leslie Mason states she is doing 0 minutes 0 times per week.  Today's visit was #: 43 Starting weight: 254 lbs Starting date: 05/06/2020 Today's weight: 210 lbs Today's date: 12/21/2022 Total lbs lost to date: 44 Total lbs lost since last in-office visit: 2  Interim History: Patient is doing well with her weight loss.  She is working long hours at work.  Subjective:   1. Essential hypertension Patient's blood pressure is controlled despite increased stress.  She denies chest pain or headache.  2. Polyphagia Patient is unable to get Zepbound, and Saxenda is now not in production.  She would like to try [facility].  3. Emotional Eating Behavior Patient notes increased stress especially at work.  She is mindful of her temptations.  Assessment/Plan:   1. Essential hypertension Patient will continue with her diet and will continue to monitor to watch for hypotension.  2. Polyphagia Patient agreed to discontinue Zepbound, and start Wegovy 1 mg once weekly with no refills.  We will follow-up at her next visit in 1 month.   - Semaglutide-Weight Management (WEGOVY) 1 MG/0.5ML SOAJ; Inject 1 mg into the skin once a week.  Dispense: 2 mL; Refill: 0  3. Emotional Eating Behavior Patient will continue Wellbutrin SR, and we will refill for 90 days.  - buPROPion (WELLBUTRIN SR) 200 MG 12 hr tablet; Take 1 tablet (200 mg total) by mouth daily.  Dispense: 90 tablet; Refill: 0  4. BMI 31.0-31.9,adult  5. Obesity, Beginning BMI 38.62 Leslie Mason is currently in the action stage of change. As such, her goal is to continue with weight loss efforts. She has agreed to the Category 2 Plan.   Behavioral modification strategies: increasing lean  protein intake and increasing water intake.  Leslie Mason has agreed to follow-up with our clinic in 4 weeks. She was informed of the importance of frequent follow-up visits to maximize her success with intensive lifestyle modifications for her multiple health conditions.   Objective:   Blood pressure 119/85, pulse 68, temperature (!) 97.5 F (36.4 C), height 5\' 8"  (1.727 m), weight 210 lb (95.3 kg), SpO2 100 %. Body mass index is 31.93 kg/m.  Lab Results  Component Value Date   CREATININE 1.16 (H) 08/29/2022   BUN 16 08/29/2022   NA 140 08/29/2022   K 4.1 08/29/2022   CL 98 08/29/2022   CO2 27 08/29/2022   Lab Results  Component Value Date   ALT 10 08/29/2022   AST 16 08/29/2022   ALKPHOS 110 08/29/2022   BILITOT 0.4 08/29/2022   Lab Results  Component Value Date   HGBA1C 5.4 08/29/2022   HGBA1C 5.4 12/05/2021   HGBA1C 5.5 08/29/2021   HGBA1C 5.7 (H) 04/14/2021   HGBA1C 5.8 (H) 11/08/2020   Lab Results  Component Value Date   INSULIN 24.2 08/29/2022   INSULIN 9.6 12/05/2021   INSULIN 6.2 08/29/2021   INSULIN 12.4 04/14/2021   INSULIN 7.1 11/08/2020   Lab Results  Component Value Date   TSH 2.040 04/14/2021   Lab Results  Component Value Date   CHOL 198 08/29/2022   HDL 59 08/29/2022   LDLCALC 124 (H) 08/29/2022   TRIG 81 08/29/2022   CHOLHDL 3.3 11/08/2020   Lab Results  Component Value Date   VD25OH 64.8 08/29/2022   VD25OH 85.4 12/05/2021   VD25OH 93.5 08/29/2021   Lab Results  Component Value Date   WBC 7.3 08/29/2022   HGB 12.7 08/29/2022   HCT 41.7 08/29/2022   MCV 84 08/29/2022   PLT 268 08/29/2022   Lab Results  Component Value Date   IRON 81 08/29/2022   TIBC 295 08/29/2022   FERRITIN 312 (H) 08/29/2022   Attestation Statements:   Reviewed by clinician on day of visit: allergies, medications, problem list, medical history, surgical history, family history, social history, and previous encounter notes.   I, Burt Knack, am acting as  transcriptionist for Quillian Quince, MD.  I have reviewed the above documentation for accuracy and completeness, and I agree with the above. -  Quillian Quince, MD

## 2022-12-27 ENCOUNTER — Other Ambulatory Visit (HOSPITAL_COMMUNITY): Payer: Self-pay

## 2022-12-28 ENCOUNTER — Other Ambulatory Visit (HOSPITAL_COMMUNITY): Payer: Self-pay

## 2022-12-28 ENCOUNTER — Other Ambulatory Visit: Payer: Self-pay

## 2023-01-01 ENCOUNTER — Other Ambulatory Visit (INDEPENDENT_AMBULATORY_CARE_PROVIDER_SITE_OTHER): Payer: Self-pay | Admitting: Family Medicine

## 2023-01-01 DIAGNOSIS — I10 Essential (primary) hypertension: Secondary | ICD-10-CM

## 2023-01-05 ENCOUNTER — Other Ambulatory Visit (HOSPITAL_COMMUNITY): Payer: Self-pay

## 2023-01-05 ENCOUNTER — Other Ambulatory Visit: Payer: Self-pay | Admitting: Obstetrics and Gynecology

## 2023-01-05 DIAGNOSIS — Z1231 Encounter for screening mammogram for malignant neoplasm of breast: Secondary | ICD-10-CM

## 2023-01-08 ENCOUNTER — Other Ambulatory Visit (HOSPITAL_COMMUNITY): Payer: Self-pay

## 2023-01-10 ENCOUNTER — Other Ambulatory Visit (HOSPITAL_COMMUNITY): Payer: Self-pay

## 2023-01-17 ENCOUNTER — Ambulatory Visit (INDEPENDENT_AMBULATORY_CARE_PROVIDER_SITE_OTHER): Payer: 59 | Admitting: Family Medicine

## 2023-01-23 ENCOUNTER — Ambulatory Visit (INDEPENDENT_AMBULATORY_CARE_PROVIDER_SITE_OTHER): Payer: 59 | Admitting: Family Medicine

## 2023-02-04 ENCOUNTER — Other Ambulatory Visit (INDEPENDENT_AMBULATORY_CARE_PROVIDER_SITE_OTHER): Payer: Self-pay | Admitting: Family Medicine

## 2023-02-04 DIAGNOSIS — R632 Polyphagia: Secondary | ICD-10-CM

## 2023-02-04 DIAGNOSIS — I10 Essential (primary) hypertension: Secondary | ICD-10-CM

## 2023-02-05 ENCOUNTER — Other Ambulatory Visit (HOSPITAL_COMMUNITY): Payer: Self-pay

## 2023-02-05 ENCOUNTER — Other Ambulatory Visit: Payer: Self-pay

## 2023-02-06 ENCOUNTER — Ambulatory Visit: Payer: 59 | Admitting: Plastic Surgery

## 2023-02-07 ENCOUNTER — Ambulatory Visit: Admission: RE | Admit: 2023-02-07 | Payer: 59 | Source: Ambulatory Visit

## 2023-02-07 DIAGNOSIS — Z1231 Encounter for screening mammogram for malignant neoplasm of breast: Secondary | ICD-10-CM

## 2023-02-08 ENCOUNTER — Other Ambulatory Visit (HOSPITAL_COMMUNITY): Payer: Self-pay

## 2023-02-08 ENCOUNTER — Encounter (INDEPENDENT_AMBULATORY_CARE_PROVIDER_SITE_OTHER): Payer: Self-pay | Admitting: Family Medicine

## 2023-02-08 ENCOUNTER — Ambulatory Visit (INDEPENDENT_AMBULATORY_CARE_PROVIDER_SITE_OTHER): Payer: 59 | Admitting: Family Medicine

## 2023-02-08 VITALS — BP 119/84 | HR 60 | Temp 97.8°F | Ht 68.0 in | Wt 215.0 lb

## 2023-02-08 DIAGNOSIS — I10 Essential (primary) hypertension: Secondary | ICD-10-CM

## 2023-02-08 DIAGNOSIS — E669 Obesity, unspecified: Secondary | ICD-10-CM

## 2023-02-08 DIAGNOSIS — Z6832 Body mass index (BMI) 32.0-32.9, adult: Secondary | ICD-10-CM | POA: Diagnosis not present

## 2023-02-08 DIAGNOSIS — R632 Polyphagia: Secondary | ICD-10-CM

## 2023-02-08 MED ORDER — WEGOVY 1 MG/0.5ML ~~LOC~~ SOAJ
1.0000 mg | SUBCUTANEOUS | 0 refills | Status: DC
  Filled 2023-02-08: qty 2, 28d supply, fill #0

## 2023-02-08 MED ORDER — CHLORTHALIDONE 25 MG PO TABS
25.0000 mg | ORAL_TABLET | Freq: Every day | ORAL | 0 refills | Status: DC
Start: 2023-02-08 — End: 2023-03-08
  Filled 2023-02-08: qty 30, 30d supply, fill #0

## 2023-02-08 NOTE — Progress Notes (Signed)
.smr  Office: [phone]  /  Fax: [phone]  WEIGHT SUMMARY AND BIOMETRICS  Anthropometric Measurements Height: 5\' 8"  (1.727 m) Weight: 215 lb (97.5 kg) BMI (Calculated): 32.7 Weight at Last Visit: 210 lb Weight Lost Since Last Visit: 0 lb Weight Gained Since Last Visit: 5 lb Starting Weight: 254 lb Total Weight Loss (lbs): 39 lb (17.7 kg)   Body Composition  Body Fat %: 44 % Fat Mass (lbs): 94.6 lbs Muscle Mass (lbs): 114.6 lbs Total Body Water (lbs): 82 lbs Visceral Fat Rating : 11   Other Clinical Data Fasting: no Labs: no Today's Visit #: 44    Chief Complaint: OBESITY   History of Present Illness   Leslie Mason, a 52 year old individual with obesity and hypertension, reports a weight gain of five pounds over the past six weeks. She admits to adhering to the category two dietary plan approximately 50% of the time. Despite this, she maintains an active lifestyle, walking for exercise approximately 45 minutes five times per week.  She recently had to travel for work, which disrupted her dietary routine due to the necessity of grab-and-go meals. She suspects that some of the weight gain may be due to fluid retention, as she has noticed swelling in her left leg. However, she has not been able to eliminate this fluid through urination, but rather by elevating her feet.  Leslie Mason is also on medication for hypertension, which is well-controlled with her current regimen. She is also on Saxenda for obesity, which she plans to switch to [facility] once her current supply is exhausted. She has also been prescribed chlorthalidone, which she will need a refill of soon.  She expresses a desire to incorporate more varied exercises into her routine, but struggles with finding the time. She also expresses dissatisfaction with the category two plan for breakfast, finding it difficult to adhere to due to her busy schedule.          PHYSICAL EXAM:  Blood pressure 119/84, pulse 60,  temperature 97.8 F (36.6 C), height 5\' 8"  (1.727 m), weight 215 lb (97.5 kg), last menstrual period 04/01/2017, SpO2 100%. Body mass index is 32.69 kg/m.  DIAGNOSTIC DATA REVIEWED:  BMET    Component Value Date/Time   NA 140 08/29/2022 1455   K 4.1 08/29/2022 1455   CL 98 08/29/2022 1455   CO2 27 08/29/2022 1455   GLUCOSE 75 08/29/2022 1455   GLUCOSE 126 (H) 06/05/2017 0515   BUN 16 08/29/2022 1455   CREATININE 1.16 (H) 08/29/2022 1455   CALCIUM 10.5 (H) 08/29/2022 1455   GFRNONAA 59 (L) 05/06/2020 1033   GFRAA 68 05/06/2020 1033   Lab Results  Component Value Date   HGBA1C 5.4 08/29/2022   HGBA1C 5.7 (H) 06/01/2017   Lab Results  Component Value Date   INSULIN 24.2 08/29/2022   INSULIN 9.1 05/06/2020   Lab Results  Component Value Date   TSH 2.040 04/14/2021   CBC    Component Value Date/Time   WBC 7.3 08/29/2022 1455   WBC 8.3 06/06/2017 0528   RBC 4.99 08/29/2022 1455   RBC 4.73 06/06/2017 0528   HGB 12.7 08/29/2022 1455   HCT 41.7 08/29/2022 1455   PLT 268 08/29/2022 1455   MCV 84 08/29/2022 1455   MCH 25.5 (L) 08/29/2022 1455   MCH 25.6 (L) 06/06/2017 0528   MCHC 30.5 (L) 08/29/2022 1455   MCHC 31.3 06/06/2017 0528   RDW 13.1 08/29/2022 1455   Iron Studies    Component Value Date/Time  IRON 81 08/29/2022 1455   TIBC 295 08/29/2022 1455   FERRITIN 312 (H) 08/29/2022 1455   IRONPCTSAT 27 08/29/2022 1455   Lipid Panel     Component Value Date/Time   CHOL 198 08/29/2022 1455   TRIG 81 08/29/2022 1455   HDL 59 08/29/2022 1455   CHOLHDL 3.3 11/08/2020 1037   LDLCALC 124 (H) 08/29/2022 1455   Hepatic Function Panel     Component Value Date/Time   PROT 7.4 08/29/2022 1455   ALBUMIN 4.2 08/29/2022 1455   AST 16 08/29/2022 1455   ALT 10 08/29/2022 1455   ALKPHOS 110 08/29/2022 1455   BILITOT 0.4 08/29/2022 1455      Component Value Date/Time   TSH 2.040 04/14/2021 1014   Nutritional Lab Results  Component Value Date   VD25OH 64.8  08/29/2022   VD25OH 85.4 12/05/2021   VD25OH 93.5 08/29/2021     Assessment and Plan    Obesity Weight gain of 5 pounds in the last 6 weeks. Difficulty adhering to category two plan, particularly for breakfast. Patient is exercising regularly. Half of the weight gain is water, likely secondary to increased sodium intake during recent travel. -Continue category two plan for lunch and dinner. -Modify breakfast plan to aim for 300 calories with at least 20 grams of protein. Patient has flexibility to choose breakfast options that meet these criteria. -Continue Wegovy 1mg  once weekly. Refill prescription. -Encourage addition of strength training exercises as time allows. -Return visit in 4 weeks.  Hypertension Well controlled on current medication regimen. -Continue Chlorthalidone. Refill prescription.       She was informed of the importance of frequent follow up visits to maximize her success with intensive lifestyle modifications for her multiple health conditions.    Quillian Quince, MD

## 2023-03-04 ENCOUNTER — Other Ambulatory Visit (INDEPENDENT_AMBULATORY_CARE_PROVIDER_SITE_OTHER): Payer: Self-pay | Admitting: Family Medicine

## 2023-03-04 DIAGNOSIS — I10 Essential (primary) hypertension: Secondary | ICD-10-CM

## 2023-03-05 ENCOUNTER — Other Ambulatory Visit: Payer: Self-pay

## 2023-03-06 ENCOUNTER — Other Ambulatory Visit (HOSPITAL_COMMUNITY): Payer: Self-pay

## 2023-03-08 ENCOUNTER — Other Ambulatory Visit (HOSPITAL_COMMUNITY): Payer: Self-pay

## 2023-03-08 ENCOUNTER — Ambulatory Visit (INDEPENDENT_AMBULATORY_CARE_PROVIDER_SITE_OTHER): Payer: 59 | Admitting: Family Medicine

## 2023-03-08 ENCOUNTER — Encounter (INDEPENDENT_AMBULATORY_CARE_PROVIDER_SITE_OTHER): Payer: Self-pay | Admitting: Family Medicine

## 2023-03-08 VITALS — BP 121/79 | HR 62 | Temp 97.8°F | Ht 68.0 in | Wt 215.0 lb

## 2023-03-08 DIAGNOSIS — I10 Essential (primary) hypertension: Secondary | ICD-10-CM

## 2023-03-08 DIAGNOSIS — R632 Polyphagia: Secondary | ICD-10-CM | POA: Diagnosis not present

## 2023-03-08 DIAGNOSIS — E669 Obesity, unspecified: Secondary | ICD-10-CM

## 2023-03-08 DIAGNOSIS — Z6832 Body mass index (BMI) 32.0-32.9, adult: Secondary | ICD-10-CM | POA: Diagnosis not present

## 2023-03-08 MED ORDER — CHLORTHALIDONE 25 MG PO TABS
25.0000 mg | ORAL_TABLET | Freq: Every day | ORAL | 0 refills | Status: DC
Start: 2023-03-08 — End: 2023-04-09
  Filled 2023-03-08: qty 30, 30d supply, fill #0

## 2023-03-08 MED ORDER — WEGOVY 1 MG/0.5ML ~~LOC~~ SOAJ
1.0000 mg | SUBCUTANEOUS | 0 refills | Status: DC
  Filled 2023-03-08: qty 2, 28d supply, fill #0

## 2023-03-08 NOTE — Progress Notes (Signed)
Chief Complaint:   OBESITY Leslie Mason is here to discuss her progress with her obesity treatment plan along with follow-up of her obesity related diagnoses. Leslie Mason is on the Category 2 Plan and states she is following her eating plan approximately 75% of the time. Leslie Mason states she is walking and weight lifting for 45 minutes 2 times per week.  Today's visit was #: 45 Starting weight: 254 lbs Starting date: 05/06/2020 Today's weight: 215 lbs Today's date: 03/08/2023 Total lbs lost to date: 39 Total lbs lost since last in-office visit: 0  Interim History: Patient has had a lot of stressors and she has not been concentrating on her own health.  She has been trying to meal plan using Chat GPT.  Subjective:   1. Polyphagia Patient is finishing up Saxenda and she is ready to change to [facility].  She notes decreased hunger and she has been skipping some meals.  2. Essential hypertension Patient's blood pressure is stable on her medications even with increased stress.  She denies chest pain or headache.  Assessment/Plan:   1. Polyphagia We will refill Wegovy 1 mg once weekly for 1 month.  Patient is to make strategies to help her not skip meals.  - Semaglutide-Weight Management (WEGOVY) 1 MG/0.5ML SOAJ; Inject 1 mg into the skin once a week.  Dispense: 2 mL; Refill: 0  2. Essential hypertension Patient will continue chlorthalidone 25 mg once daily, and we will refill for 1 month.  - chlorthalidone (HYGROTON) 25 MG tablet; Take 1 tablet (25 mg total) by mouth daily.  Dispense: 30 tablet; Refill: 0  3. BMI 32.0-32.9,adult  4. Obesity, Beginning BMI 38.62 Leslie Mason is currently in the action stage of change. As such, her goal is to continue with weight loss efforts. She has agreed to the Category 2 Plan.   Exercise goals: As is.   Behavioral modification strategies: keeping healthy foods in the home.  Leslie Mason has agreed to follow-up with our clinic in 4 weeks. She was informed of  the importance of frequent follow-up visits to maximize her success with intensive lifestyle modifications for her multiple health conditions.   Objective:   Blood pressure 121/79, pulse 62, temperature 97.8 F (36.6 C), height 5\' 8"  (1.727 m), weight 215 lb (97.5 kg), last menstrual period 04/01/2017. Body mass index is 32.69 kg/m.  Lab Results  Component Value Date   CREATININE 1.16 (H) 08/29/2022   BUN 16 08/29/2022   NA 140 08/29/2022   K 4.1 08/29/2022   CL 98 08/29/2022   CO2 27 08/29/2022   Lab Results  Component Value Date   ALT 10 08/29/2022   AST 16 08/29/2022   ALKPHOS 110 08/29/2022   BILITOT 0.4 08/29/2022   Lab Results  Component Value Date   HGBA1C 5.4 08/29/2022   HGBA1C 5.4 12/05/2021   HGBA1C 5.5 08/29/2021   HGBA1C 5.7 (H) 04/14/2021   HGBA1C 5.8 (H) 11/08/2020   Lab Results  Component Value Date   INSULIN 24.2 08/29/2022   INSULIN 9.6 12/05/2021   INSULIN 6.2 08/29/2021   INSULIN 12.4 04/14/2021   INSULIN 7.1 11/08/2020   Lab Results  Component Value Date   TSH 2.040 04/14/2021   Lab Results  Component Value Date   CHOL 198 08/29/2022   HDL 59 08/29/2022   LDLCALC 124 (H) 08/29/2022   TRIG 81 08/29/2022   CHOLHDL 3.3 11/08/2020   Lab Results  Component Value Date   VD25OH 64.8 08/29/2022   VD25OH 85.4 12/05/2021  VD25OH 93.5 08/29/2021   Lab Results  Component Value Date   WBC 7.3 08/29/2022   HGB 12.7 08/29/2022   HCT 41.7 08/29/2022   MCV 84 08/29/2022   PLT 268 08/29/2022   Lab Results  Component Value Date   IRON 81 08/29/2022   TIBC 295 08/29/2022   FERRITIN 312 (H) 08/29/2022   Attestation Statements:   Reviewed by clinician on day of visit: allergies, medications, problem list, medical history, surgical history, family history, social history, and previous encounter notes.   I, Burt Knack, am acting as transcriptionist for Quillian Quince, MD.  I have reviewed the above documentation for accuracy and  completeness, and I agree with the above. -  Quillian Quince, MD

## 2023-03-22 ENCOUNTER — Other Ambulatory Visit (HOSPITAL_COMMUNITY): Payer: Self-pay

## 2023-04-09 ENCOUNTER — Encounter (INDEPENDENT_AMBULATORY_CARE_PROVIDER_SITE_OTHER): Payer: Self-pay | Admitting: Family Medicine

## 2023-04-09 ENCOUNTER — Ambulatory Visit (INDEPENDENT_AMBULATORY_CARE_PROVIDER_SITE_OTHER): Payer: 59 | Admitting: Family Medicine

## 2023-04-09 ENCOUNTER — Other Ambulatory Visit: Payer: Self-pay

## 2023-04-09 ENCOUNTER — Other Ambulatory Visit (HOSPITAL_COMMUNITY): Payer: Self-pay

## 2023-04-09 VITALS — BP 113/78 | HR 59 | Temp 97.5°F | Ht 68.0 in | Wt 208.0 lb

## 2023-04-09 DIAGNOSIS — F3289 Other specified depressive episodes: Secondary | ICD-10-CM | POA: Diagnosis not present

## 2023-04-09 DIAGNOSIS — Z6831 Body mass index (BMI) 31.0-31.9, adult: Secondary | ICD-10-CM

## 2023-04-09 DIAGNOSIS — I10 Essential (primary) hypertension: Secondary | ICD-10-CM | POA: Diagnosis not present

## 2023-04-09 DIAGNOSIS — E669 Obesity, unspecified: Secondary | ICD-10-CM | POA: Diagnosis not present

## 2023-04-09 DIAGNOSIS — R632 Polyphagia: Secondary | ICD-10-CM

## 2023-04-09 MED ORDER — WEGOVY 1 MG/0.5ML ~~LOC~~ SOAJ
1.0000 mg | SUBCUTANEOUS | 0 refills | Status: DC
  Filled 2023-04-09: qty 2, 28d supply, fill #0

## 2023-04-09 MED ORDER — BUPROPION HCL ER (SR) 200 MG PO TB12
200.0000 mg | ORAL_TABLET | Freq: Every day | ORAL | 0 refills | Status: DC
Start: 2023-04-09 — End: 2023-05-08
  Filled 2023-04-09: qty 30, 30d supply, fill #0

## 2023-04-09 MED ORDER — METOPROLOL SUCCINATE ER 25 MG PO TB24
25.0000 mg | ORAL_TABLET | Freq: Every day | ORAL | 0 refills | Status: DC
Start: 2023-04-09 — End: 2023-05-08
  Filled 2023-04-09: qty 30, 30d supply, fill #0

## 2023-04-09 MED ORDER — CHLORTHALIDONE 25 MG PO TABS
25.0000 mg | ORAL_TABLET | Freq: Every day | ORAL | 0 refills | Status: DC
Start: 2023-04-09 — End: 2023-06-12
  Filled 2023-04-09: qty 30, 30d supply, fill #0
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
## 2023-04-10 ENCOUNTER — Other Ambulatory Visit (HOSPITAL_COMMUNITY): Payer: Self-pay

## 2023-05-07 NOTE — Telephone Encounter (Signed)
Date: 07/18/2022 [facility] [address] Jana Hakim [place], [place] [address] RE: Denial of initial request for coverage of Saxenda (liraglutide) Dear [name] Davis: You or your doctor recently requested prescription coverage for Saxenda (liraglutide). After careful consideration and review of your prescription plan's drug list and the information sent to [place], this request was not approved. We understand that this decision may not be what you and your doctor expected. This letter and the enclosed information will explain your options and help you decide what to do next. Why your request was denied: Your plan only covers this drug when you experience benefits from taking the drug. We have denied your request because you did not have good outcomes from the drug. We reviewed the information we had. Your request has been denied. Your doctor can send [place] any new or missing information for [place] to review. For this drug, you may have to meet other criteria. You can request the drug policy for more details. You can also request other plan documents for your review. We reviewed all the supporting information sent to [place] and used your plan's guidelines to make our decision. If you'd like [place] to send you a free copy of the guidelines (requirements, criteria, or protocols) we used, call the number on your prescription ID card. For more information about your prescription benefit, please refer to the prescription benefit section in your benefit plan materials. We've let your doctor know about this denial, so they will be ready to work with you on your next steps or an alternative treatment plan. [facility] En[facility] cc: Dr. Quillian Quince _________________________________________________________

## 2023-05-08 ENCOUNTER — Other Ambulatory Visit (HOSPITAL_COMMUNITY): Payer: Self-pay

## 2023-05-08 ENCOUNTER — Ambulatory Visit (INDEPENDENT_AMBULATORY_CARE_PROVIDER_SITE_OTHER): Payer: 59 | Admitting: Family Medicine

## 2023-05-08 ENCOUNTER — Encounter (INDEPENDENT_AMBULATORY_CARE_PROVIDER_SITE_OTHER): Payer: Self-pay | Admitting: Family Medicine

## 2023-05-08 VITALS — BP 114/74 | HR 59 | Temp 97.6°F | Ht 68.0 in | Wt 198.0 lb

## 2023-05-08 DIAGNOSIS — E441 Mild protein-calorie malnutrition: Secondary | ICD-10-CM | POA: Diagnosis not present

## 2023-05-08 DIAGNOSIS — R632 Polyphagia: Secondary | ICD-10-CM | POA: Diagnosis not present

## 2023-05-08 DIAGNOSIS — E669 Obesity, unspecified: Secondary | ICD-10-CM

## 2023-05-08 DIAGNOSIS — F3289 Other specified depressive episodes: Secondary | ICD-10-CM

## 2023-05-08 DIAGNOSIS — Z6830 Body mass index (BMI) 30.0-30.9, adult: Secondary | ICD-10-CM

## 2023-05-08 DIAGNOSIS — I10 Essential (primary) hypertension: Secondary | ICD-10-CM | POA: Diagnosis not present

## 2023-05-08 MED ORDER — METOPROLOL SUCCINATE ER 25 MG PO TB24
25.0000 mg | ORAL_TABLET | Freq: Every day | ORAL | 0 refills | Status: DC
  Filled 2023-05-08: qty 30, 30d supply, fill #0

## 2023-05-08 MED ORDER — WEGOVY 0.5 MG/0.5ML ~~LOC~~ SOAJ
0.5000 mg | SUBCUTANEOUS | 0 refills | Status: DC
  Filled 2023-05-08: qty 2, 28d supply, fill #0

## 2023-05-08 MED ORDER — BUPROPION HCL ER (SR) 200 MG PO TB12
200.0000 mg | ORAL_TABLET | Freq: Every day | ORAL | 0 refills | Status: DC
  Filled 2023-05-08: qty 30, 30d supply, fill #0

## 2023-05-08 NOTE — Progress Notes (Signed)
.smr  Office: [phone]  /  Fax: [phone]  WEIGHT SUMMARY AND BIOMETRICS  Anthropometric Measurements Height: 5\' 8"  (1.727 m) Weight: 198 lb (89.8 kg) BMI (Calculated): 30.11 Weight at Last Visit: 208 lb Weight Lost Since Last Visit: 10 lb Weight Gained Since Last Visit: 0 Starting Weight: 254 lb Total Weight Loss (lbs): 56 lb (25.4 kg)   Body Composition  Body Fat %: 41.7 % Fat Mass (lbs): 82.8 lbs Muscle Mass (lbs): 110.2 lbs Total Body Water (lbs): 74.8 lbs Visceral Fat Rating : 10   Other Clinical Data Fasting: No Labs: No Today's Visit #: 48 Starting Date: 05/06/20    Chief Complaint: OBESITY   History of Present Illness   The patient, diagnosed with obesity, hypertension, and depression with emotional eating behaviors, presents for a follow-up visit. She reports a weight loss of ten pounds since the last visit a month ago, attributing this to adherence to the category two eating plan 75% of the time and daily 30-minute walks. Despite these positive changes, the patient reports difficulty consuming the full recommended diet due to a lack of hunger, which she attributes to her obesity medication, Wegovy.  The patient also shares her experience of a recent trip to [place], during which she noticed a significant decrease in her food intake. She reports that a single restaurant meal lasted her three meals, and she often found herself unable to finish her food. This pattern has continued since her return, with the patient often eating only a single piece of chicken or a few spoonfuls of pasta in a meal.  In terms of her other conditions, the patient's hypertension is well-controlled on Toprol XL 25 mg daily, with a blood pressure reading of 114/74 at the current visit. She is also on Wellbutrin SR 200 mg daily for depression, which she reports no issues with.  The patient's sleep has improved recently, with her reporting that she "passes out" as soon as she hits the  pillow. She does not currently use a CPAP machine for sleep apnea. 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  Lastly, the patient discusses her plans for the upcoming holiday season and her concerns about managing her diet during this time. She expresses a desire to maintain her current progress and continue her weight loss journey.          PHYSICAL EXAM:  Blood pressure 114/74, pulse (!) 59, temperature 97.6 F (36.4 C), height 5\' 8"  (1.727 m), weight 198 lb (89.8 kg), last menstrual period 04/01/2017, SpO2 97%. Body mass index is 30.11 kg/m.  DIAGNOSTIC DATA REVIEWED:  BMET    Component Value Date/Time   NA 140 08/29/2022 1455   K 4.1 08/29/2022 1455   CL 98 08/29/2022 1455   CO2 27 08/29/2022 1455   GLUCOSE 75 08/29/2022 1455   GLUCOSE 126 (H) 06/05/2017 0515   BUN 16 08/29/2022 1455   CREATININE 1.16 (H) 08/29/2022 1455   CALCIUM 10.5 (H) 08/29/2022 1455   GFRNONAA 59 (L) 05/06/2020 1033   GFRAA 68 05/06/2020 1033   Lab Results  Component Value Date   HGBA1C 5.4 08/29/2022   HGBA1C 5.7 (H) 06/01/2017   Lab Results  Component Value Date   INSULIN 24.2 08/29/2022   INSULIN 9.1 05/06/2020   Lab Results  Component Value Date   TSH 2.040 04/14/2021   CBC    Component Value Date/Time   WBC 7.3 08/29/2022 1455   WBC  8.3 06/06/2017 0528   RBC 4.99 08/29/2022 1455   RBC 4.73 06/06/2017 0528   HGB 12.7 08/29/2022 1455   HCT 41.7 08/29/2022 1455   PLT 268 08/29/2022 1455   MCV 84 08/29/2022 1455   MCH 25.5 (L) 08/29/2022 1455   MCH 25.6 (L) 06/06/2017 0528   MCHC 30.5 (L) 08/29/2022 1455   MCHC 31.3 06/06/2017 0528   RDW 13.1 08/29/2022 1455   Iron Studies    Component Value Date/Time   IRON 81 08/29/2022 1455   TIBC 295 08/29/2022 1455   FERRITIN 312 (H) 08/29/2022 1455   IRONPCTSAT 27 08/29/2022 1455   Lipid Panel     Component Value Date/Time   CHOL 198 08/29/2022 1455   TRIG 81  08/29/2022 1455   HDL 59 08/29/2022 1455   CHOLHDL 3.3 11/08/2020 1037   LDLCALC 124 (H) 08/29/2022 1455   Hepatic Function Panel     Component Value Date/Time   PROT 7.4 08/29/2022 1455   ALBUMIN 4.2 08/29/2022 1455   AST 16 08/29/2022 1455   ALT 10 08/29/2022 1455   ALKPHOS 110 08/29/2022 1455   BILITOT 0.4 08/29/2022 1455      Component Value Date/Time   TSH 2.040 04/14/2021 1014   Nutritional Lab Results  Component Value Date   VD25OH 64.8 08/29/2022   VD25OH 85.4 12/05/2021   VD25OH 93.5 08/29/2021     Assessment and Plan    Obesity Significant progress with 10 lbs weight loss in the past month. Adherence to category two eating plan 75% of the time. Currently on Wegovy 1mg  weekly, but experiencing reduced appetite leading to inadequate nutritional intake. -Reduce Wegovy to 0.5mg  weekly to balance appetite control and nutritional needs. -Continue Catagory 2 eating plan  Hypertension Well controlled with Toprol XL 25mg  daily, as evidenced by BP 114/74. Also managing with diet and exercise. -Continue Toprol XL 25mg  daily. -Refill Toprol XL prescription.  Depression with Emotional Eating Behaviors On Wellbutrin SR 200mg  daily. No specific concerns raised during the visit. -Continue Wellbutrin SR 200mg  daily. -Refill Wellbutrin SR prescription.  Calorie-Protein Malnutrition Mild, secondary to reduced appetite from [facility] Patient is not consuming full category two eating plan. She is losing muscle mass already. -Encourage patient to consume everything on the category two eating plan. -Reduce Wegovy to 0.5mg  weekly to help improve nutritional intake.  Follow-up in 4 weeks to monitor progress and adjust treatment plan as necessary. Schedule January appointment.        She was informed of the importance of frequent follow up visits to maximize her success with intensive lifestyle modifications for her multiple health conditions.    Quillian Quince, MD

## 2023-05-10 ENCOUNTER — Other Ambulatory Visit (HOSPITAL_COMMUNITY): Payer: Self-pay

## 2023-06-08 ENCOUNTER — Other Ambulatory Visit (HOSPITAL_COMMUNITY): Payer: Self-pay

## 2023-06-08 MED ORDER — METOPROLOL SUCCINATE ER 25 MG PO TB24
25.0000 mg | ORAL_TABLET | Freq: Every day | ORAL | 5 refills | Status: DC
  Filled 2023-06-08: qty 30, 30d supply, fill #0
  Filled 2023-07-20: qty 30, 30d supply, fill #1

## 2023-06-12 ENCOUNTER — Other Ambulatory Visit (HOSPITAL_COMMUNITY): Payer: Self-pay

## 2023-06-12 ENCOUNTER — Other Ambulatory Visit: Payer: Self-pay

## 2023-06-12 ENCOUNTER — Encounter (INDEPENDENT_AMBULATORY_CARE_PROVIDER_SITE_OTHER): Payer: Self-pay | Admitting: Family Medicine

## 2023-06-12 ENCOUNTER — Ambulatory Visit (INDEPENDENT_AMBULATORY_CARE_PROVIDER_SITE_OTHER): Payer: 59 | Admitting: Family Medicine

## 2023-06-12 VITALS — BP 109/74 | HR 71 | Temp 98.1°F | Ht 68.0 in | Wt 195.0 lb

## 2023-06-12 DIAGNOSIS — E559 Vitamin D deficiency, unspecified: Secondary | ICD-10-CM | POA: Diagnosis not present

## 2023-06-12 DIAGNOSIS — I10 Essential (primary) hypertension: Secondary | ICD-10-CM

## 2023-06-12 DIAGNOSIS — E66812 Obesity, class 2: Secondary | ICD-10-CM

## 2023-06-12 DIAGNOSIS — E785 Hyperlipidemia, unspecified: Secondary | ICD-10-CM

## 2023-06-12 DIAGNOSIS — Z6829 Body mass index (BMI) 29.0-29.9, adult: Secondary | ICD-10-CM

## 2023-06-12 DIAGNOSIS — E7800 Pure hypercholesterolemia, unspecified: Secondary | ICD-10-CM

## 2023-06-12 DIAGNOSIS — F3289 Other specified depressive episodes: Secondary | ICD-10-CM

## 2023-06-12 DIAGNOSIS — F5089 Other specified eating disorder: Secondary | ICD-10-CM

## 2023-06-12 DIAGNOSIS — E88819 Insulin resistance, unspecified: Secondary | ICD-10-CM

## 2023-06-12 MED ORDER — CHLORTHALIDONE 25 MG PO TABS
25.0000 mg | ORAL_TABLET | Freq: Every day | ORAL | 0 refills | Status: DC
  Filled 2023-06-12: qty 30, 30d supply, fill #0

## 2023-06-12 MED ORDER — WEGOVY 0.5 MG/0.5ML ~~LOC~~ SOAJ
0.5000 mg | SUBCUTANEOUS | 0 refills | Status: DC
Start: 2023-06-12 — End: 2023-07-23
  Filled 2023-06-12: qty 2, 28d supply, fill #0
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
## 2023-06-14 ENCOUNTER — Encounter (INDEPENDENT_AMBULATORY_CARE_PROVIDER_SITE_OTHER): Payer: Self-pay | Admitting: Family Medicine

## 2023-06-14 ENCOUNTER — Other Ambulatory Visit: Payer: Self-pay

## 2023-06-14 ENCOUNTER — Observation Stay (HOSPITAL_COMMUNITY)
Admission: EM | Admit: 2023-06-14 | Discharge: 2023-06-17 | Disposition: A | Discharge status: Home / Self Care | Payer: 59 | Attending: Internal Medicine | Admitting: Internal Medicine

## 2023-06-14 ENCOUNTER — Encounter (HOSPITAL_COMMUNITY): Payer: Self-pay | Admitting: Emergency Medicine

## 2023-06-14 DIAGNOSIS — I10 Essential (primary) hypertension: Secondary | ICD-10-CM | POA: Diagnosis not present

## 2023-06-14 DIAGNOSIS — E876 Hypokalemia: Secondary | ICD-10-CM | POA: Diagnosis not present

## 2023-06-14 DIAGNOSIS — R112 Nausea with vomiting, unspecified: Secondary | ICD-10-CM | POA: Diagnosis present

## 2023-06-14 DIAGNOSIS — Z794 Long term (current) use of insulin: Secondary | ICD-10-CM | POA: Insufficient documentation

## 2023-06-14 DIAGNOSIS — Q5039 Other congenital malformation of ovary: Secondary | ICD-10-CM | POA: Diagnosis not present

## 2023-06-14 DIAGNOSIS — E8729 Other acidosis: Secondary | ICD-10-CM | POA: Diagnosis not present

## 2023-06-14 DIAGNOSIS — Z79899 Other long term (current) drug therapy: Secondary | ICD-10-CM | POA: Diagnosis not present

## 2023-06-14 DIAGNOSIS — R1110 Vomiting, unspecified: Secondary | ICD-10-CM | POA: Diagnosis present

## 2023-06-14 DIAGNOSIS — R7303 Prediabetes: Secondary | ICD-10-CM | POA: Diagnosis present

## 2023-06-14 LAB — CBC WITH DIFFERENTIAL/PLATELET
Abs Immature Granulocytes: 0.02 10*3/uL (ref 0.00–0.07)
Basophils Absolute: 0 10*3/uL (ref 0.0–0.1)
Basophils Relative: 0 %
Eosinophils Absolute: 0 10*3/uL (ref 0.0–0.5)
Eosinophils Relative: 1 %
HCT: 42 % (ref 36.0–46.0)
Hemoglobin: 13.3 g/dL (ref 12.0–15.0)
Immature Granulocytes: 0 %
Lymphocytes Relative: 26 %
Lymphs Abs: 1.8 10*3/uL (ref 0.7–4.0)
MCH: 25.9 pg — ABNORMAL LOW (ref 26.0–34.0)
MCHC: 31.7 g/dL (ref 30.0–36.0)
MCV: 81.7 fL (ref 80.0–100.0)
Monocytes Absolute: 0.2 10*3/uL (ref 0.1–1.0)
Monocytes Relative: 3 %
Neutro Abs: 4.7 10*3/uL (ref 1.7–7.7)
Neutrophils Relative %: 70 %
Platelets: 284 10*3/uL (ref 150–400)
RBC: 5.14 MIL/uL — ABNORMAL HIGH (ref 3.87–5.11)
RDW: 14.1 % (ref 11.5–15.5)
WBC: 6.9 10*3/uL (ref 4.0–10.5)
nRBC: 0 % (ref 0.0–0.2)

## 2023-06-14 LAB — CMP14+EGFR
ALT: 9 [IU]/L (ref 0–32)
AST: 17 [IU]/L (ref 0–40)
Albumin: 4.3 g/dL (ref 3.8–4.9)
Alkaline Phosphatase: 90 [IU]/L (ref 44–121)
BUN/Creatinine Ratio: 13 (ref 9–23)
BUN: 13 mg/dL (ref 6–24)
Bilirubin Total: 0.8 mg/dL (ref 0.0–1.2)
CO2: 26 mmol/L (ref 20–29)
Calcium: 10.2 mg/dL (ref 8.7–10.2)
Chloride: 99 mmol/L (ref 96–106)
Creatinine, Ser: 0.99 mg/dL (ref 0.57–1.00)
Globulin, Total: 3 g/dL (ref 1.5–4.5)
Glucose: 75 mg/dL (ref 70–99)
Potassium: 3.7 mmol/L (ref 3.5–5.2)
Sodium: 140 mmol/L (ref 134–144)
Total Protein: 7.3 g/dL (ref 6.0–8.5)
eGFR: 68 mL/min/{1.73_m2} (ref >59)

## 2023-06-14 LAB — COMPREHENSIVE METABOLIC PANEL
ALT: 11 U/L (ref 0–44)
AST: 20 U/L (ref 15–41)
Albumin: 4.4 g/dL (ref 3.5–5.0)
Alkaline Phosphatase: 73 U/L (ref 38–126)
Anion gap: 17 — ABNORMAL HIGH (ref 5–15)
BUN: 16 mg/dL (ref 6–20)
CO2: 21 mmol/L — ABNORMAL LOW (ref 22–32)
Calcium: 10.8 mg/dL — ABNORMAL HIGH (ref 8.9–10.3)
Chloride: 100 mmol/L (ref 98–111)
Creatinine, Ser: 0.98 mg/dL (ref 0.44–1.00)
GFR, Estimated: >60 mL/min (ref >60)
Glucose, Bld: 126 mg/dL — ABNORMAL HIGH (ref 70–99)
Potassium: 3 mmol/L — ABNORMAL LOW (ref 3.5–5.1)
Sodium: 138 mmol/L (ref 135–145)
Total Bilirubin: 1.7 mg/dL — ABNORMAL HIGH (ref <1.2)
Total Protein: 8.5 g/dL — ABNORMAL HIGH (ref 6.5–8.1)

## 2023-06-14 LAB — LIPID PANEL WITH LDL/HDL RATIO
Cholesterol, Total: 203 mg/dL — ABNORMAL HIGH (ref 100–199)
HDL: 58 mg/dL (ref >39)
LDL Chol Calc (NIH): 134 mg/dL — ABNORMAL HIGH (ref 0–99)
LDL/HDL Ratio: 2.3 {ratio} (ref 0.0–3.2)
Triglycerides: 62 mg/dL (ref 0–149)
VLDL Cholesterol Cal: 11 mg/dL (ref 5–40)

## 2023-06-14 LAB — VITAMIN D 25 HYDROXY (VIT D DEFICIENCY, FRACTURES): Vit D, 25-Hydroxy: 71.7 ng/mL (ref 30.0–100.0)

## 2023-06-14 LAB — HEMOGLOBIN A1C
Est. average glucose Bld gHb Est-mCnc: 117 mg/dL
Hgb A1c MFr Bld: 5.7 % — ABNORMAL HIGH (ref 4.8–5.6)

## 2023-06-14 LAB — INSULIN, RANDOM: INSULIN: 9.4 u[IU]/mL (ref 2.6–24.9)

## 2023-06-14 LAB — LIPASE, BLOOD: Lipase: 28 U/L (ref 11–51)

## 2023-06-14 LAB — VITAMIN B12: Vitamin B-12: 281 pg/mL (ref 232–1245)

## 2023-06-14 MED ORDER — SODIUM CHLORIDE 0.9 % IV BOLUS
1000.0000 mL | Freq: Once | INTRAVENOUS | Status: AC
  Administered 2023-06-14: 1000 mL via INTRAVENOUS

## 2023-06-14 MED ORDER — ONDANSETRON HCL 4 MG/2ML IJ SOLN
4.0000 mg | Freq: Once | INTRAMUSCULAR | Status: AC
  Administered 2023-06-14: 4 mg via INTRAVENOUS
  Filled 2023-06-14: qty 2

## 2023-06-14 MED ORDER — MAGNESIUM SULFATE 2 GM/50ML IV SOLN
2.0000 g | Freq: Once | INTRAVENOUS | Status: AC
  Administered 2023-06-15: 2 g via INTRAVENOUS
  Filled 2023-06-14: qty 50

## 2023-06-14 MED ORDER — METOCLOPRAMIDE HCL 5 MG/ML IJ SOLN
10.0000 mg | Freq: Once | INTRAMUSCULAR | Status: AC
  Administered 2023-06-14: 10 mg via INTRAVENOUS
  Filled 2023-06-14: qty 2

## 2023-06-14 MED ORDER — POTASSIUM CHLORIDE 10 MEQ/100ML IV SOLN
10.0000 meq | Freq: Once | INTRAVENOUS | Status: AC
  Administered 2023-06-15: 10 meq via INTRAVENOUS
  Filled 2023-06-14: qty 100

## 2023-06-14 MED ORDER — FAMOTIDINE IN NACL 20-0.9 MG/50ML-% IV SOLN
20.0000 mg | Freq: Once | INTRAVENOUS | Status: AC
  Administered 2023-06-14: 20 mg via INTRAVENOUS
  Filled 2023-06-14: qty 50

## 2023-06-14 NOTE — ED Triage Notes (Signed)
Pt arrives POV w/ c/o nausea, vomiting since Tuesday due to medication. Pt also c/o dizziness. Took wegovy Saturday. Pt reports she has been on the medication x 2 months.

## 2023-06-14 NOTE — ED Notes (Signed)
Pt unable to obtain urine specimen.

## 2023-06-14 NOTE — ED Notes (Signed)
Pt ambulated to restroom. 

## 2023-06-14 NOTE — ED Provider Triage Note (Signed)
Emergency Medicine Provider Triage Evaluation Note  Leslie Mason , a 52 y.o. female  was evaluated in triage.  Pt complains of N/V.  Review of Systems  Positive: N/V, been on wygovy x 1 mo, chills Negative: Abd pain, diarrhea, fever, cough, congestion  Physical Exam  BP (!) 122/100   Pulse 87   Temp 98 F (36.7 C) (Axillary)   Resp (!) 26   LMP 04/01/2017 (Approximate)   SpO2 100%  Gen:   Awake, ill-appearing Resp:  Normal effort  MSK:   Moves extremities without difficulty  Other:    Medical Decision Making  Medically screening exam initiated at 10:39 PM.  Appropriate orders placed.  Leslie Mason was informed that the remainder of the evaluation will be completed by another provider, this initial triage assessment does not replace that evaluation, and the importance of remaining in the ED until their evaluation is complete.     Dolphus Jenny, PA-C 06/14/23 2240

## 2023-06-14 NOTE — Telephone Encounter (Signed)
Called patient and she stated she took the 1 mg injection on Saturday and the N&V began on Tuesday. She was finishing up the 1 mg and this was the last dose. At her last OV, you went down to 0.5 mg but she has not began taking it. Please advise.

## 2023-06-15 ENCOUNTER — Emergency Department (HOSPITAL_COMMUNITY): Payer: 59

## 2023-06-15 ENCOUNTER — Inpatient Hospital Stay (HOSPITAL_COMMUNITY): Payer: 59

## 2023-06-15 ENCOUNTER — Other Ambulatory Visit: Payer: Self-pay

## 2023-06-15 ENCOUNTER — Encounter (HOSPITAL_COMMUNITY): Payer: Self-pay | Admitting: Internal Medicine

## 2023-06-15 DIAGNOSIS — E876 Hypokalemia: Secondary | ICD-10-CM | POA: Insufficient documentation

## 2023-06-15 DIAGNOSIS — R112 Nausea with vomiting, unspecified: Secondary | ICD-10-CM | POA: Diagnosis not present

## 2023-06-15 DIAGNOSIS — R1110 Vomiting, unspecified: Secondary | ICD-10-CM | POA: Diagnosis present

## 2023-06-15 DIAGNOSIS — Q5039 Other congenital malformation of ovary: Secondary | ICD-10-CM | POA: Diagnosis not present

## 2023-06-15 HISTORY — DX: Nausea with vomiting, unspecified: R11.2

## 2023-06-15 LAB — URINALYSIS, ROUTINE W REFLEX MICROSCOPIC
Bilirubin Urine: NEGATIVE
Glucose, UA: NEGATIVE mg/dL
Hgb urine dipstick: NEGATIVE
Ketones, ur: 80 mg/dL — AB
Leukocytes,Ua: NEGATIVE
Nitrite: NEGATIVE
Protein, ur: NEGATIVE mg/dL
Specific Gravity, Urine: 1.013 (ref 1.005–1.030)
pH: 7 (ref 5.0–8.0)

## 2023-06-15 LAB — BETA-HYDROXYBUTYRIC ACID: Beta-Hydroxybutyric Acid: 2.67 mmol/L — ABNORMAL HIGH (ref 0.05–0.27)

## 2023-06-15 LAB — MAGNESIUM: Magnesium: 1.9 mg/dL (ref 1.7–2.4)

## 2023-06-15 LAB — BILIRUBIN, FRACTIONATED(TOT/DIR/INDIR)
Bilirubin, Direct: 0.2 mg/dL (ref 0.0–0.2)
Indirect Bilirubin: 1 mg/dL — ABNORMAL HIGH (ref 0.3–0.9)
Total Bilirubin: 1.2 mg/dL — ABNORMAL HIGH (ref <1.2)

## 2023-06-15 LAB — HIV ANTIBODY (ROUTINE TESTING W REFLEX): HIV Screen 4th Generation wRfx: NONREACTIVE

## 2023-06-15 MED ORDER — ACETAMINOPHEN 325 MG PO TABS
650.0000 mg | ORAL_TABLET | Freq: Four times a day (QID) | ORAL | Status: DC | PRN
  Administered 2023-06-16: 650 mg via ORAL
  Filled 2023-06-15: qty 2

## 2023-06-15 MED ORDER — PROCHLORPERAZINE EDISYLATE 10 MG/2ML IJ SOLN
5.0000 mg | Freq: Once | INTRAMUSCULAR | Status: AC
  Administered 2023-06-15: 5 mg via INTRAVENOUS
  Filled 2023-06-15: qty 2

## 2023-06-15 MED ORDER — DEXTROSE-SODIUM CHLORIDE 5-0.45 % IV SOLN: INTRAVENOUS | Status: AC

## 2023-06-15 MED ORDER — ONDANSETRON HCL 4 MG/2ML IJ SOLN
4.0000 mg | Freq: Four times a day (QID) | INTRAMUSCULAR | Status: DC
  Administered 2023-06-15 – 2023-06-17 (×7): 4 mg via INTRAVENOUS
  Filled 2023-06-15 (×8): qty 2

## 2023-06-15 MED ORDER — PROMETHAZINE HCL 25 MG/ML IJ SOLN
INTRAMUSCULAR | Status: AC
  Filled 2023-06-15: qty 1

## 2023-06-15 MED ORDER — METOPROLOL SUCCINATE ER 25 MG PO TB24
25.0000 mg | ORAL_TABLET | Freq: Every day | ORAL | Status: DC
  Administered 2023-06-15 – 2023-06-17 (×3): 25 mg via ORAL
  Filled 2023-06-15 (×3): qty 1

## 2023-06-15 MED ORDER — SODIUM CHLORIDE 0.9 % IV BOLUS
1000.0000 mL | Freq: Once | INTRAVENOUS | Status: AC
Start: 2023-06-15 — End: 2023-06-15
  Administered 2023-06-15: 1000 mL via INTRAVENOUS

## 2023-06-15 MED ORDER — POTASSIUM CHLORIDE 10 MEQ/100ML IV SOLN
10.0000 meq | INTRAVENOUS | Status: AC
  Administered 2023-06-15: 10 meq via INTRAVENOUS
  Filled 2023-06-15: qty 100

## 2023-06-15 MED ORDER — ENOXAPARIN SODIUM 40 MG/0.4ML IJ SOSY: 40.0000 mg | PREFILLED_SYRINGE | INTRAMUSCULAR | Status: DC

## 2023-06-15 MED ORDER — SODIUM CHLORIDE 0.9 % IV SOLN
12.5000 mg | Freq: Four times a day (QID) | INTRAVENOUS | Status: DC | PRN
  Administered 2023-06-15: 12.5 mg via INTRAVENOUS
  Filled 2023-06-15: qty 0.5

## 2023-06-15 MED ORDER — ACETAMINOPHEN 650 MG RE SUPP: 650.0000 mg | Freq: Four times a day (QID) | RECTAL | Status: DC | PRN

## 2023-06-15 MED ORDER — ENOXAPARIN SODIUM 40 MG/0.4ML IJ SOSY
40.0000 mg | PREFILLED_SYRINGE | INTRAMUSCULAR | Status: DC
  Administered 2023-06-15 – 2023-06-16 (×2): 40 mg via SUBCUTANEOUS
  Filled 2023-06-15 (×2): qty 0.4

## 2023-06-15 MED ORDER — IOHEXOL 300 MG/ML  SOLN
100.0000 mL | Freq: Once | INTRAMUSCULAR | Status: AC | PRN
  Administered 2023-06-15: 100 mL via INTRAVENOUS

## 2023-06-15 MED ORDER — POTASSIUM CHLORIDE IN NACL 20-0.9 MEQ/L-% IV SOLN: INTRAVENOUS | Status: AC

## 2023-06-15 MED ORDER — PANTOPRAZOLE SODIUM 40 MG IV SOLR
40.0000 mg | Freq: Two times a day (BID) | INTRAVENOUS | Status: DC
  Administered 2023-06-15 – 2023-06-17 (×5): 40 mg via INTRAVENOUS
  Filled 2023-06-15 (×5): qty 10

## 2023-06-15 NOTE — ED Notes (Signed)
Attempted IV without success.

## 2023-06-15 NOTE — ED Notes (Signed)
Pt transported to Ultrasound.  

## 2023-06-15 NOTE — ED Provider Notes (Addendum)
[place] EMERGENCY DEPARTMENT AT [facility] Provider Note   CSN: [id number] Arrival date & time: 06/14/23  2159     History  Chief Complaint  Patient presents with   Emesis   Nausea    Leslie Mason is a 52 y.o. female.  Here with nausea and vomiting for a few days. Had an episode of nbnb emesis on Sunday but vomiting got worse on Tuesday through today multiple times a day, no diarrhea. Has h/o constipation but no significant changes. Been on wegovy for about a month. Started at 1mg /week, reduced to 0.5/week recently. Had nausea with it previously but not this. No recent alcohol. Denies otc meds, tobacco, drugs.    Emesis      Home Medications Prior to Admission medications   Medication Sig Start Date End Date Taking? Authorizing Provider  albuterol (PROVENTIL HFA;VENTOLIN HFA) 108 (90 Base) MCG/ACT inhaler Inhale 2 puffs into the lungs every 6 (six) hours as needed for wheezing or shortness of breath.    [provider]  albuterol (VENTOLIN HFA) 108 (90 Base) MCG/ACT inhaler Inhale 2 puffs into the lungs every 6 (six) hours as needed. 12/07/22     Calcium Carbonate (CALCIUM 600 PO) Take 1 tablet by mouth in the morning and at bedtime.    [provider]  chlorthalidone (HYGROTON) 25 MG tablet Take 1 tablet (25 mg total) by mouth daily. 06/12/23   Quillian Quince D, MD  clobetasol cream (TEMOVATE) 0.05 % Apply 1 application topically daily as needed.    [provider]  docusate sodium (COLACE) 100 MG capsule Take 1 capsule (100 mg total) by mouth 2 (two) times daily. 11/10/21   Quillian Quince D, MD  Insulin Pen Needle (BD PEN NEEDLE NANO U/F) 32G X 4 MM MISC Use as directed 07/04/22   Quillian Quince D, MD  metoprolol succinate (TOPROL-XL) 25 MG 24 hr tablet Take 1 tablet (25 mg total) by mouth daily. 08/29/22   Quillian Quince D, MD  metoprolol succinate (TOPROL-XL) 25 MG 24 hr tablet Take 1 tablet (25 mg total) by mouth daily. 06/08/23      Semaglutide-Weight Management (WEGOVY) 0.5 MG/0.5ML SOAJ Inject 0.5 mg into the skin once a week. 06/12/23   Wilder Glade, MD      Allergies    Contrave [naltrexone-bupropion hcl er]    Review of Systems   Review of Systems  Gastrointestinal:  Positive for vomiting.    Physical Exam Updated Vital Signs BP (!) 147/94   Pulse 80   Temp 98 F (36.7 C)   Resp 20   LMP 04/01/2017 (Approximate)   SpO2 97%  Physical Exam Vitals and nursing note reviewed.  Constitutional:      Appearance: She is well-developed.  HENT:     Head: Normocephalic and atraumatic.     Mouth/Throat:     Mouth: Mucous membranes are dry.  Cardiovascular:     Rate and Rhythm: Normal rate and regular rhythm.  Pulmonary:     Effort: No respiratory distress.     Breath sounds: No stridor.  Abdominal:     General: There is no distension.  Musculoskeletal:     Cervical back: Normal range of motion.  Neurological:     Mental Status: She is alert.     ED Results / Procedures / Treatments   Labs (all labs ordered are listed, but only abnormal results are displayed) Labs Reviewed  COMPREHENSIVE METABOLIC PANEL - Abnormal; Notable for the following components:  Result Value   Potassium 3.0 (*)    CO2 21 (*)    Glucose, Bld 126 (*)    Calcium 10.8 (*)    Total Protein 8.5 (*)    Total Bilirubin 1.7 (*)    Anion gap 17 (*)    All other components within normal limits  URINALYSIS, ROUTINE W REFLEX MICROSCOPIC - Abnormal; Notable for the following components:   Color, Urine STRAW (*)    Ketones, ur 80 (*)    All other components within normal limits  CBC WITH DIFFERENTIAL/PLATELET - Abnormal; Notable for the following components:   RBC 5.14 (*)    MCH 25.9 (*)    All other components within normal limits  BETA-HYDROXYBUTYRIC ACID - Abnormal; Notable for the following components:   Beta-Hydroxybutyric Acid 2.67 (*)    All other components within normal limits  LIPASE, BLOOD  MAGNESIUM     EKG None  Radiology CT ABDOMEN PELVIS W CONTRAST Result Date: 06/15/2023 CLINICAL DATA:  52 year old female with history of nausea and vomiting. Acute onset of nonlocalized abdominal pain. EXAM: CT ABDOMEN AND PELVIS WITH CONTRAST TECHNIQUE: Multidetector CT imaging of the abdomen and pelvis was performed using the standard protocol following bolus administration of intravenous contrast. RADIATION DOSE REDUCTION: This exam was performed according to the departmental dose-optimization program which includes automated exposure control, adjustment of the mA and/or kV according to patient size and/or use of iterative reconstruction technique. CONTRAST:  OMNIPAQUE IOHEXOL 300 MG/ML  SOLN COMPARISON:  No priors. FINDINGS: Lower chest: Unremarkable. Hepatobiliary: Subcentimeter low-attenuation lesion in the posterior aspect of segment 7 of the liver, too small to characterize, but statistically likely tiny cysts (no imaging follow-up recommended). No suspicious cystic or solid hepatic lesions. No intra or extrahepatic biliary ductal dilatation. Gallbladder is unremarkable in appearance. Pancreas: No pancreatic mass. No pancreatic ductal dilatation. No pancreatic or peripancreatic fluid collections or inflammatory changes. Spleen: Unremarkable. Adrenals/Urinary Tract: Bilateral kidneys and adrenal glands are normal in appearance. No hydroureteronephrosis. Urinary bladder is unremarkable in appearance. Stomach/Bowel: Postoperative changes associated with the stomach suggesting partial gastrectomy. No pathologic dilatation of small bowel or colon. Normal appendix. Vascular/Lymphatic: Atherosclerotic calcifications are noted in the abdominal aorta and pelvic vasculature. No lymphadenopathy noted in the abdomen or pelvis. Reproductive: Uterus and right ovary are unremarkable in appearance. In the left adnexal region (axial image 75 of series 2 and coronal image 50 of series 4) there is a 6.1 x 4.2 x 3.4 cm  intermediate attenuation lesion which is likely ovarian in origin. Other: No significant volume of ascites.  No pneumoperitoneum. Musculoskeletal: There are no aggressive appearing lytic or blastic lesions noted in the visualized portions of the skeleton. IMPRESSION: 1. Intermediate attenuation mass-like area in the left adnexa suspicious for probable ovarian lesion. Further evaluation with pelvic ultrasound is strongly recommended to better evaluate this finding and exclude the possibility of neoplasm. 2. No other potential acute findings are noted elsewhere in the abdomen or pelvis to account for the patient's symptoms. 3. Aortic atherosclerosis. Electronically Signed   By: Trudie Reed M.D.   On: 06/15/2023 06:17    Procedures Procedures    Medications Ordered in ED Medications  promethazine (PHENERGAN) 12.5 mg in sodium chloride 0.9 % 50 mL IVPB (0 mg Intravenous Stopped 06/15/23 0419)  dextrose 5 % and 0.45 % NaCl infusion ( Intravenous New Bag/Given 06/15/23 0609)  potassium chloride 10 mEq in 100 mL IVPB (has no administration in time range)  sodium chloride 0.9 % bolus  1,000 mL (0 mLs Intravenous Stopped 06/15/23 0049)  ondansetron (ZOFRAN) injection 4 mg (4 mg Intravenous Given 06/14/23 2232)  famotidine (PEPCID) IVPB 20 mg premix (0 mg Intravenous Stopped 06/14/23 2259)  metoCLOPramide (REGLAN) injection 10 mg (10 mg Intravenous Given 06/14/23 2349)  potassium chloride 10 mEq in 100 mL IVPB (0 mEq Intravenous Stopped 06/15/23 0101)  magnesium sulfate IVPB 2 g 50 mL (0 g Intravenous Stopped 06/15/23 0102)  sodium chloride 0.9 % bolus 1,000 mL (1,000 mLs Intravenous Bolus 06/15/23 0059)  iohexol (OMNIPAQUE) 300 MG/ML solution 100 mL (100 mLs Intravenous Contrast Given 06/15/23 0544)  prochlorperazine (COMPAZINE) injection 5 mg (5 mg Intravenous Given 06/15/23 0645)    ED Course/ Medical Decision Making/ A&P                                 Medical Decision Making Amount and/or  Complexity of Data Reviewed Labs: ordered. Radiology: ordered. ECG/medicine tests: ordered.  Risk Prescription drug management. Decision regarding hospitalization.   Abdominal exam benign. VS reassuring. Labs c/w hypokalemia and dehydration. Possibly med related vs viral vs gastritis. Will treat for same with symptomatic care. No indication for imaging at this time.   Repleted K. Fluids given. Ketones in urine and mild gap. Likely starvation related. Doubt DKA.   BHB slightly elevated. No h/o DM. Will start d5 1/2. Also more runs of potassium.   Persistently vomiting after 3 doses of antiemetics, fluids and other interventions. In the setting of emesis will ct and admit.   Ct shows ovarian lesion, will add on [place]. No other indication for etiology of emesis, will consult hospitalist.   Discssed ovarian lesion with patient and father and that we would do the ultrasound but not likely any further workup here.  She needed some type of oncologic workup it would probably be done as an outpatient if it was normal that is all she really needed done.  Discussed with Dr. Thomes Dinning and bed request placed day team to follow-up on patient.  Final Clinical Impression(s) / ED Diagnoses Final diagnoses:  Nausea and vomiting, unspecified vomiting type  Hypokalemia  Ovary anomaly  Increased anion gap metabolic acidosis    Rx / DC Orders ED Discharge Orders     None         Andee Chivers, Barbara Cower, MD 06/15/23 4098    Marily Memos, MD 06/15/23 [phone]

## 2023-06-15 NOTE — ED Notes (Signed)
Pt given water and saltine crackers for PO Challenge. Pt tolerated challenge well, no complaints of nausea, and no episodes of vomiting.

## 2023-06-15 NOTE — Progress Notes (Signed)
   06/15/23 1020  TOC Brief Assessment  Insurance and Status Reviewed  Patient has primary care physician Yes  Home environment has been reviewed from home  Prior level of function: independent  Prior/Current Home Services No current home services  Social Drivers of Health Review SDOH reviewed no interventions necessary  Readmission risk has been reviewed Yes  Transition of care needs no transition of care needs at this time    Transition of Care Department [facility]) has reviewed patient and no TOC needs have been identified at this time. We will continue to monitor patient advancement through interdisciplinary progression rounds. If new patient transition needs arise, please place a TOC consult.

## 2023-06-15 NOTE — H&P (Signed)
History and Physical    Patient: Leslie Mason ZOX:[id number] DOB: 1971/03/06 DOA: 06/14/2023 DOS: the patient was seen and examined on 06/15/2023 PCP: Georgann Housekeeper, MD  Patient coming from: Home  Chief Complaint:  Chief Complaint  Patient presents with   Emesis   Nausea   HPI: Leslie Mason is a 52 year old female with a history of hypertension, obesity, laparoscopic sleeve gastrectomy (Dr. Gaynelle Adu 06/04/17) and OSA presenting with intractable nausea and vomiting.  The patient states that she began having nausea and vomiting on 06/10/2023 which has gradually worsened on subsequent days.  Is significantly worsened since 06/12/2023 with emesis over 5 times per day.  She denies any hematemesis.  She has had some subjective fever and chills.  She states that she has been on [facility] for about 1 and 1/2 months.  She stated that she had some nausea and decreased appetite since starting [facility].  However has not had any emesis until 06/10/2023.  She has been taking 1 mg/week.  She had been instructed to decrease the dose to 0.5 mg/week, but had not yet received that dose.  Her last dose of Wegovy was 1 mg on 06/09/2023. She denies any frank abdominal pain but states that she does have some abdominal discomfort in general.  She denies any diarrhea, hematochezia, melena.  She denies any dysuria, hematuria.  She denies any headache, neck pain. She denies any new medications.  She has had her chlorthalidone decreased to every other day dosing.  There have been no other medication changes. In the ED, the patient was afebrile hemodynamically stable with oxygen saturation 96-97% on room air.  WBC 6.8, hemoglobin 13.3, platelets 284.  Sodium 130, potassium 3.0, bicarbonate 21, serum creatinine 0.98.  AST 20, ALT 11, alk phosphatase 23, total bili 1.7.  CT abdomen and pelvis showed an indeterminate masslike area in the left adnexa suspicious for an ovarian lesion.  There is no other potential acute findings  elsewhere to account for the patient's symptoms. The patient continued to have nausea and vomiting in the emergency department.  She was started IV fluids.  Admission was requested for her intractable vomiting.  Review of Systems: As mentioned in the history of present illness. All other systems reviewed and are negative. Past Medical History:  Diagnosis Date   Asthma    Blood glucose elevated    Chewing difficulty    Chronic bronchitis (HCC)    Chronic low back pain without sciatica    Eczema    Family history of adverse reaction to anesthesia    mother has hx of seizures, anesthesia made seizures more freuqeunt, had to be monitored inhospital longer ; procedure wss to remove a" polyp from her stomach or something"    Fatigue    Hay fever    Hypertension    Nausea & vomiting 06/15/2023   Pre-diabetes    Shortness of breath on exertion    Sleep apnea    cpap use regular    Swelling of lower extremity    TMJ (dislocation of temporomandibular joint)    Past Surgical History:  Procedure Laterality Date   ANKLE FRACTURE SURGERY Right    x3 , also achilles heel problems    GINGIVECTOMY     early 2000s   LAPAROSCOPIC GASTRIC SLEEVE RESECTION N/A 06/04/2017   Procedure: LAPAROSCOPIC GASTRIC SLEEVE RESECTION WITH UPPER ENDO, HIATAL HERNIA REPAIR;  Surgeon: Gaynelle Adu, MD;  Location: [facility] ORS;  Service: General;  Laterality: N/A;   SHOULDER SURGERY Right 2002  rotator cuff repair    Social History:  reports that she has never smoked. She has never used smokeless tobacco. She reports current alcohol use. She reports that she does not use drugs.  Allergies  Allergen Reactions   Contrave [Naltrexone-Bupropion Hcl Er] Other (See Comments)    constipation    Family History  Problem Relation Age of Onset   Breast cancer Maternal Aunt    Asthma Other    Cancer Other    Hypertension Other    Prostate cancer Father    Diabetes Father    Cancer Mother     Prior to Admission  medications   Medication Sig Start Date End Date Taking? Authorizing Provider  albuterol (PROVENTIL HFA;VENTOLIN HFA) 108 (90 Base) MCG/ACT inhaler Inhale 2 puffs into the lungs every 6 (six) hours as needed for wheezing or shortness of breath.    [provider]  albuterol (VENTOLIN HFA) 108 (90 Base) MCG/ACT inhaler Inhale 2 puffs into the lungs every 6 (six) hours as needed. 12/07/22     Calcium Carbonate (CALCIUM 600 PO) Take 1 tablet by mouth in the morning and at bedtime.    [provider]  chlorthalidone (HYGROTON) 25 MG tablet Take 1 tablet (25 mg total) by mouth daily. 06/12/23   Quillian Quince D, MD  clobetasol cream (TEMOVATE) 0.05 % Apply 1 application topically daily as needed.    [provider]  docusate sodium (COLACE) 100 MG capsule Take 1 capsule (100 mg total) by mouth 2 (two) times daily. 11/10/21   Quillian Quince D, MD  Insulin Pen Needle (BD PEN NEEDLE NANO U/F) 32G X 4 MM MISC Use as directed 07/04/22   Quillian Quince D, MD  metoprolol succinate (TOPROL-XL) 25 MG 24 hr tablet Take 1 tablet (25 mg total) by mouth daily. 08/29/22   Quillian Quince D, MD  metoprolol succinate (TOPROL-XL) 25 MG 24 hr tablet Take 1 tablet (25 mg total) by mouth daily. 06/08/23     Semaglutide-Weight Management (WEGOVY) 0.5 MG/0.5ML SOAJ Inject 0.5 mg into the skin once a week. 06/12/23   Quillian Quince D, MD    Physical Exam: Vitals:   06/15/23 0345 06/15/23 0430 06/15/23 0500 06/15/23 0530  BP: (!) 144/101 (!) 151/97 (!) 142/102 (!) 147/94  Pulse: 78 75 85 80  Resp: (!) 22 19 (!) 23 20  Temp:      TempSrc:      SpO2: 97% 96% 97% 97%   GENERAL:  A&O x 3, NAD, well developed, cooperative, follows commands HEENT: [place]/AT, No thrush, No icterus, No oral ulcers Neck:  No neck mass, No meningismus, soft, supple CV: RRR, no S3, no S4, no rub, no JVD Lungs:  CTA, no wheeze, no rhonchi, good air movement Abd: soft/NT +BS, nondistended Ext: No edema, no lymphangitis, no  cyanosis, no rashes Neuro:  CN II-XII intact, strength 4/5 in RUE, RLE, strength 4/5 LUE, LLE; sensation intact bilateral; no dysmetria; babinski equivocal  Data Reviewed: Data reviewed above in history   Assessment and Plan: Intractable nausea and vomiting -Suspect this may be due to the patient's Wegovy -Start pantoprazole -Start Zofran around-the-clock -Continue IV fluids -Lipase 28 -06/15/2023 CT abdomen and pelvis unremarkable with regard to etiology for her vomiting and abdominal pain  Essential hypertension -Holding chlorthalidone temporarily -Restart metoprolol succinate  Hypercalcemia -Corrected calcium 10.8 -Check intact PTH -May be related to the patient's chlorthalidone  Ovarian lesion -Pelvic ultrasound  Hypokalemia -Replete -Check magnesium  Impaired glucose tolerance -06/12/2023 hemoglobin A1c  5.7    Advance Care Planning: FULL CODE  Consults: none  Family Communication: father updated 12/13  Severity of Illness: The appropriate patient status for this patient is INPATIENT. Inpatient status is judged to be reasonable and necessary in order to provide the required intensity of service to ensure the patient's safety. The patient's presenting symptoms, physical exam findings, and initial radiographic and laboratory data in the context of their chronic comorbidities is felt to place them at high risk for further clinical deterioration. Furthermore, it is not anticipated that the patient will be medically stable for discharge from the hospital within 2 midnights of admission.   * I certify that at the point of admission it is my clinical judgment that the patient will require inpatient hospital care spanning beyond 2 midnights from the point of admission due to high intensity of service, high risk for further deterioration and high frequency of surveillance required.*  Author: Catarina Hartshorn, MD 06/15/2023 7:42 AM  For on call review [email or web address].

## 2023-06-15 NOTE — ED Notes (Signed)
Pt vomited crackers and water; AC notified of need for phenergan

## 2023-06-15 NOTE — ED Notes (Signed)
Pt gone to CT and back to room

## 2023-06-15 NOTE — ED Notes (Signed)
Pt ambulated to restroom. Trying to obtain urine specimen.

## 2023-06-15 NOTE — ED Notes (Signed)
Pt given a cup of water 

## 2023-06-15 NOTE — Hospital Course (Signed)
52 year old female with a history of hypertension, obesity, laparoscopic sleeve gastrectomy (Dr. Gaynelle Adu 06/04/17) and OSA presenting with intractable nausea and vomiting.  The patient states that she began having nausea and vomiting on 06/10/2023 which has gradually worsened on subsequent days.  Is significantly worsened since 06/12/2023 with emesis over 5 times per day.  She denies any hematemesis.  She has had some subjective fever and chills.  She states that she has been on [facility] for about 1 and 1/2 months.  She stated that she had some nausea and decreased appetite since starting [facility].  However has not had any emesis until 06/10/2023.  She has been taking 1 mg/week.  She had been instructed to decrease the dose to 0.5 mg/week, but had not yet received that dose.  Her last dose of Wegovy was 1 mg on 06/09/2023. She denies any frank abdominal pain but states that she does have some abdominal discomfort in general.  She denies any diarrhea, hematochezia, melena.  She denies any dysuria, hematuria.  She denies any headache, neck pain. She denies any new medications.  She has had her chlorthalidone decreased to every other day dosing.  There have been no other medication changes. In the ED, the patient was afebrile hemodynamically stable with oxygen saturation 96-97% on room air.  WBC 6.8, hemoglobin 13.3, platelets 284.  Sodium 130, potassium 3.0, bicarbonate 21, serum creatinine 0.98.  AST 20, ALT 11, alk phosphatase 23, total bili 1.7.  CT abdomen and pelvis showed an indeterminate masslike area in the left adnexa suspicious for an ovarian lesion.  There is no other potential acute findings elsewhere to account for the patient's symptoms. The patient continued to have nausea and vomiting in the emergency department.  She was started IV fluids.  Admission was requested for her intractable vomiting.

## 2023-06-16 DIAGNOSIS — R1110 Vomiting, unspecified: Secondary | ICD-10-CM

## 2023-06-16 DIAGNOSIS — R112 Nausea with vomiting, unspecified: Secondary | ICD-10-CM | POA: Diagnosis not present

## 2023-06-16 DIAGNOSIS — E876 Hypokalemia: Secondary | ICD-10-CM | POA: Diagnosis not present

## 2023-06-16 DIAGNOSIS — Q5039 Other congenital malformation of ovary: Secondary | ICD-10-CM | POA: Diagnosis not present

## 2023-06-16 LAB — CBC
HCT: 37.3 % (ref 36.0–46.0)
Hemoglobin: 11.9 g/dL — ABNORMAL LOW (ref 12.0–15.0)
MCH: 26.4 pg (ref 26.0–34.0)
MCHC: 31.9 g/dL (ref 30.0–36.0)
MCV: 82.9 fL (ref 80.0–100.0)
Platelets: 241 10*3/uL (ref 150–400)
RBC: 4.5 MIL/uL (ref 3.87–5.11)
RDW: 14.4 % (ref 11.5–15.5)
WBC: 8 10*3/uL (ref 4.0–10.5)
nRBC: 0 % (ref 0.0–0.2)

## 2023-06-16 LAB — MAGNESIUM: Magnesium: 2 mg/dL (ref 1.7–2.4)

## 2023-06-16 LAB — COMPREHENSIVE METABOLIC PANEL
ALT: 10 U/L (ref 0–44)
AST: 17 U/L (ref 15–41)
Albumin: 3.7 g/dL (ref 3.5–5.0)
Alkaline Phosphatase: 64 U/L (ref 38–126)
Anion gap: 11 (ref 5–15)
BUN: 6 mg/dL (ref 6–20)
CO2: 23 mmol/L (ref 22–32)
Calcium: 9.3 mg/dL (ref 8.9–10.3)
Chloride: 98 mmol/L (ref 98–111)
Creatinine, Ser: 0.75 mg/dL (ref 0.44–1.00)
GFR, Estimated: >60 mL/min (ref >60)
Glucose, Bld: 88 mg/dL (ref 70–99)
Potassium: 3.6 mmol/L (ref 3.5–5.1)
Sodium: 132 mmol/L — ABNORMAL LOW (ref 135–145)
Total Bilirubin: 1.4 mg/dL — ABNORMAL HIGH (ref <1.2)
Total Protein: 7.1 g/dL (ref 6.5–8.1)

## 2023-06-16 MED ORDER — POTASSIUM CHLORIDE IN NACL 20-0.9 MEQ/L-% IV SOLN: INTRAVENOUS | Status: DC

## 2023-06-16 NOTE — Plan of Care (Signed)
  Problem: Education: Goal: Knowledge of General Education information will improve Description: Including pain rating scale, medication(s)/side effects and non-pharmacologic comfort measures Outcome: Progressing   Problem: Clinical Measurements: Goal: Ability to maintain clinical measurements within normal limits will improve Outcome: Progressing   Problem: Activity: Goal: Risk for activity intolerance will decrease Outcome: Progressing   Problem: Coping: Goal: Level of anxiety will decrease Outcome: Progressing   Problem: Elimination: Goal: Will not experience complications related to bowel motility Outcome: Progressing   Problem: Pain Management: Goal: General experience of comfort will improve Outcome: Progressing

## 2023-06-16 NOTE — Plan of Care (Signed)

## 2023-06-16 NOTE — Progress Notes (Signed)
PROGRESS NOTE  Leslie Mason WUJ:[id number] DOB: October 17, 1970 DOA: 06/14/2023 PCP: Georgann Housekeeper, MD  Brief History:  52 year old female with a history of hypertension, obesity, laparoscopic sleeve gastrectomy (Dr. Gaynelle Adu 06/04/17) and OSA presenting with intractable nausea and vomiting.  The patient states that she began having nausea and vomiting on 06/10/2023 which has gradually worsened on subsequent days.  Is significantly worsened since 06/12/2023 with emesis over 5 times per day.  She denies any hematemesis.  She has had some subjective fever and chills.  She states that she has been on [facility] for about 1 and 1/2 months.  She stated that she had some nausea and decreased appetite since starting [facility].  However has not had any emesis until 06/10/2023.  She has been taking 1 mg/week.  She had been instructed to decrease the dose to 0.5 mg/week, but had not yet received that dose.  Her last dose of Wegovy was 1 mg on 06/09/2023. She denies any frank abdominal pain but states that she does have some abdominal discomfort in general.  She denies any diarrhea, hematochezia, melena.  She denies any dysuria, hematuria.  She denies any headache, neck pain. She denies any new medications.  She has had her chlorthalidone decreased to every other day dosing.  There have been no other medication changes. In the ED, the patient was afebrile hemodynamically stable with oxygen saturation 96-97% on room air.  WBC 6.8, hemoglobin 13.3, platelets 284.  Sodium 130, potassium 3.0, bicarbonate 21, serum creatinine 0.98.  AST 20, ALT 11, alk phosphatase 23, total bili 1.7.  CT abdomen and pelvis showed an indeterminate masslike area in the left adnexa suspicious for an ovarian lesion.  There is no other potential acute findings elsewhere to account for the patient's symptoms. The patient continued to have nausea and vomiting in the emergency department.  She was started IV fluids.  Admission was requested for  her intractable vomiting.   Assessment/Plan: Intractable nausea and vomiting -Suspect this may be due to the patient's Wegovy -continue pantoprazole -Started Zofran around-the-clock -Continue IV fluids -Lipase 28 -06/15/2023 CT abdomen and pelvis unremarkable with regard to etiology for her vomiting and abdominal pain -advance to full liquids   Essential hypertension -Holding chlorthalidone temporarily -Restart metoprolol succinate   Hypercalcemia -Corrected calcium 10.8 -Check intact PTH--pending -May be related to the patient's chlorthalidone -improved with IVF   Ovarian lesion -seen on CT abd/pelvis -Pelvic ultrasound--The endometrium and left ovary not visualized due to the fibroiduterus. It is possible that the left ovarian abnormality noted on CT scan of same day may actually represent a fibroid. MRI may be considered for further evaluation. -order MR pelvis   Hypokalemia -Repleted -Check magnesium 2.0   Impaired glucose tolerance -06/12/2023 hemoglobin A1c 5.7          Family Communication:   father updated 12/14  Consultants:  none  Code Status:  FULL   DVT Prophylaxis:  [place] Lovenox   Procedures: As Listed in Progress Note Above  Antibiotics: None        Subjective: Pt states emesis is improving.  Nausea is improving.  Denies f/c, cp, sob, abd pain, diarrhea  Objective: Vitals:   06/15/23 2048 06/16/23 0007 06/16/23 0424 06/16/23 1500  BP: 132/88 136/88 136/89 (!) 139/92  Pulse: 80 80 82 77  Resp: 20 16 16 16   Temp: 98.8 F (37.1 C) 98.8 F (37.1 C) 98.9 F (37.2 C) 99.4 F (37.4 C)  TempSrc: Oral   Oral  SpO2: 100% 100% 100% 100%  Weight:      Height:        Intake/Output Summary (Last 24 hours) at 06/16/2023 1706 Last data filed at 06/16/2023 1300 Gross per 24 hour  Intake 480 ml  Output --  Net 480 ml   Weight change:  Exam:  General:  Pt is alert, follows commands appropriately, not in acute distress HEENT: No  icterus, No thrush, No neck mass, [place]/AT Cardiovascular: RRR, S1/S2, no rubs, no gallops Respiratory: CTA bilaterally, no wheezing, no crackles, no rhonchi Abdomen: Soft/+BS, non tender, non distended, no guarding Extremities: No edema, No lymphangitis, No petechiae, No rashes, no synovitis   Data Reviewed: I have personally reviewed following labs and imaging studies Basic Metabolic Panel: Recent Labs  Lab 06/12/23 1001 06/14/23 2223 06/16/23 0440  NA 140 138 132*  K 3.7 3.0* 3.6  CL 99 100 98  CO2 26 21* 23  GLUCOSE 75 126* 88  BUN 13 16 6   CREATININE 0.99 0.98 0.75  CALCIUM 10.2 10.8* 9.3  MG  --  1.9 2.0   Liver Function Tests: Recent Labs  Lab 06/12/23 1001 06/14/23 2223 06/15/23 0933 06/16/23 0440  AST 17 20  --  17  ALT 9 11  --  10  ALKPHOS 90 73  --  64  BILITOT 0.8 1.7* 1.2* 1.4*  PROT 7.3 8.5*  --  7.1  ALBUMIN 4.3 4.4  --  3.7   Recent Labs  Lab 06/14/23 2223  LIPASE 28   No results for input(s): "AMMONIA" in the last 168 hours. Coagulation Profile: No results for input(s): "INR", "PROTIME" in the last 168 hours. CBC: Recent Labs  Lab 06/14/23 2223 06/16/23 0440  WBC 6.9 8.0  NEUTROABS 4.7  --   HGB 13.3 11.9*  HCT 42.0 37.3  MCV 81.7 82.9  PLT 284 241   Cardiac Enzymes: No results for input(s): "CKTOTAL", "CKMB", "CKMBINDEX", "TROPONINI" in the last 168 hours. BNP: Invalid input(s): "POCBNP" CBG: No results for input(s): "GLUCAP" in the last 168 hours. HbA1C: No results for input(s): "HGBA1C" in the last 72 hours. Urine analysis:    Component Value Date/Time   COLORURINE STRAW (A) 06/15/2023 0127   APPEARANCEUR CLEAR 06/15/2023 0127   LABSPEC 1.013 06/15/2023 0127   PHURINE 7.0 06/15/2023 0127   GLUCOSEU NEGATIVE 06/15/2023 0127   HGBUR NEGATIVE 06/15/2023 0127   BILIRUBINUR NEGATIVE 06/15/2023 0127   KETONESUR 80 (A) 06/15/2023 0127   PROTEINUR NEGATIVE 06/15/2023 0127   NITRITE NEGATIVE 06/15/2023 0127   LEUKOCYTESUR  NEGATIVE 06/15/2023 0127   Sepsis Labs: @LABRCNTIP (procalcitonin:4,lacticidven:4) )No results found for this or any previous visit (from the past 240 hours).   Scheduled Meds:  enoxaparin (LOVENOX) injection  40 mg Subcutaneous Q24H   metoprolol succinate  25 mg Oral Daily   ondansetron (ZOFRAN) IV  4 mg Intravenous Q6H   pantoprazole (PROTONIX) IV  40 mg Intravenous Q12H   Continuous Infusions:  promethazine (PHENERGAN) injection (IM or IVPB) Stopped (06/15/23 0419)    Procedures/Studies: US PELVIC COMPLETE WITH TRANSVAGINAL Result Date: 06/15/2023 CLINICAL DATA:  Possible left ovarian cyst. EXAM: TRANSABDOMINAL AND TRANSVAGINAL ULTRASOUND OF PELVIS TECHNIQUE: Both transabdominal and transvaginal ultrasound examinations of the pelvis were performed. Transabdominal technique was performed for global imaging of the pelvis including uterus, ovaries, adnexal regions, and pelvic cul-de-sac. It was necessary to proceed with endovaginal exam following the transabdominal exam to visualize the endometrium and ovaries. COMPARISON:  CT scan of  same day. FINDINGS: Uterus Measurements: 8.7 x 8.2 x 6.8 cm = volume: 254 mL. Two uterine fibroids are noted, the largest measuring 6.4 x 3.9 x 4.4 cm. The other measures 3.4 x 3.3 x 3.0 cm. Endometrium Not visualized due to fibroids obscuring evaluation. Right ovary Measurements: 2.2 x 1.9 x 1.1 cm = volume: 2 mL. Normal appearance/no adnexal mass. Left ovary Not visualized due to fibroids. Other findings No abnormal free fluid. IMPRESSION: Two large uterine fibroids are noted, the largest measuring 6.4 cm. The endometrium and left ovary not visualized due to the fibroid uterus. It is possible that the left ovarian abnormality noted on CT scan of same day may actually represent a fibroid. MRI may be considered for further evaluation. Electronically Signed   By: Lupita Raider M.D.   On: 06/15/2023 09:33   CT ABDOMEN PELVIS W CONTRAST Result Date:  06/15/2023 CLINICAL DATA:  52 year old female with history of nausea and vomiting. Acute onset of nonlocalized abdominal pain. EXAM: CT ABDOMEN AND PELVIS WITH CONTRAST TECHNIQUE: Multidetector CT imaging of the abdomen and pelvis was performed using the standard protocol following bolus administration of intravenous contrast. RADIATION DOSE REDUCTION: This exam was performed according to the departmental dose-optimization program which includes automated exposure control, adjustment of the mA and/or kV according to patient size and/or use of iterative reconstruction technique. CONTRAST:  OMNIPAQUE IOHEXOL 300 MG/ML  SOLN COMPARISON:  No priors. FINDINGS: Lower chest: Unremarkable. Hepatobiliary: Subcentimeter low-attenuation lesion in the posterior aspect of segment 7 of the liver, too small to characterize, but statistically likely tiny cysts (no imaging follow-up recommended). No suspicious cystic or solid hepatic lesions. No intra or extrahepatic biliary ductal dilatation. Gallbladder is unremarkable in appearance. Pancreas: No pancreatic mass. No pancreatic ductal dilatation. No pancreatic or peripancreatic fluid collections or inflammatory changes. Spleen: Unremarkable. Adrenals/Urinary Tract: Bilateral kidneys and adrenal glands are normal in appearance. No hydroureteronephrosis. Urinary bladder is unremarkable in appearance. Stomach/Bowel: Postoperative changes associated with the stomach suggesting partial gastrectomy. No pathologic dilatation of small bowel or colon. Normal appendix. Vascular/Lymphatic: Atherosclerotic calcifications are noted in the abdominal aorta and pelvic vasculature. No lymphadenopathy noted in the abdomen or pelvis. Reproductive: Uterus and right ovary are unremarkable in appearance. In the left adnexal region (axial image 75 of series 2 and coronal image 50 of series 4) there is a 6.1 x 4.2 x 3.4 cm intermediate attenuation lesion which is likely ovarian in origin. Other: No  significant volume of ascites.  No pneumoperitoneum. Musculoskeletal: There are no aggressive appearing lytic or blastic lesions noted in the visualized portions of the skeleton. IMPRESSION: 1. Intermediate attenuation mass-like area in the left adnexa suspicious for probable ovarian lesion. Further evaluation with pelvic ultrasound is strongly recommended to better evaluate this finding and exclude the possibility of neoplasm. 2. No other potential acute findings are noted elsewhere in the abdomen or pelvis to account for the patient's symptoms. 3. Aortic atherosclerosis. Electronically Signed   By: Trudie Reed M.D.   On: 06/15/2023 06:17    Catarina Hartshorn, DO  [facility]  If 7PM-7AM, please contact night-coverage [email or web address] Password [email or web address] 06/16/2023, 5:06 PM   LOS: 1 day

## 2023-06-17 ENCOUNTER — Inpatient Hospital Stay (HOSPITAL_COMMUNITY): Payer: 59

## 2023-06-17 DIAGNOSIS — Q5039 Other congenital malformation of ovary: Secondary | ICD-10-CM | POA: Diagnosis not present

## 2023-06-17 DIAGNOSIS — R1110 Vomiting, unspecified: Secondary | ICD-10-CM | POA: Diagnosis not present

## 2023-06-17 DIAGNOSIS — E876 Hypokalemia: Secondary | ICD-10-CM | POA: Diagnosis not present

## 2023-06-17 LAB — BASIC METABOLIC PANEL
Anion gap: 10 (ref 5–15)
BUN: 9 mg/dL (ref 6–20)
CO2: 24 mmol/L (ref 22–32)
Calcium: 9.4 mg/dL (ref 8.9–10.3)
Chloride: 96 mmol/L — ABNORMAL LOW (ref 98–111)
Creatinine, Ser: 0.82 mg/dL (ref 0.44–1.00)
GFR, Estimated: >60 mL/min (ref >60)
Glucose, Bld: 84 mg/dL (ref 70–99)
Potassium: 3.7 mmol/L (ref 3.5–5.1)
Sodium: 130 mmol/L — ABNORMAL LOW (ref 135–145)

## 2023-06-17 LAB — MAGNESIUM: Magnesium: 2 mg/dL (ref 1.7–2.4)

## 2023-06-17 LAB — PARATHYROID HORMONE, INTACT (NO CA): PTH: 17 pg/mL (ref 15–65)

## 2023-06-17 MED ORDER — GADOBUTROL 1 MMOL/ML IV SOLN
8.5000 mL | Freq: Once | INTRAVENOUS | Status: AC | PRN
  Administered 2023-06-17: 8.5 mL via INTRAVENOUS

## 2023-06-17 MED ORDER — AMLODIPINE BESYLATE 5 MG PO TABS
5.0000 mg | ORAL_TABLET | Freq: Every day | ORAL | 1 refills | Status: DC
  Filled 2023-06-17: qty 30, 30d supply, fill #0

## 2023-06-17 NOTE — Plan of Care (Signed)
  Problem: Health Behavior/Discharge Planning: Goal: Ability to manage health-related needs will improve Outcome: Progressing   Problem: Clinical Measurements: Goal: Ability to maintain clinical measurements within normal limits will improve Outcome: Progressing Goal: Will remain free from infection Outcome: Progressing Goal: Diagnostic test results will improve Outcome: Progressing   Problem: Pain Management: Goal: General experience of comfort will improve Outcome: Progressing    Problem: Safety: Goal: Ability to remain free from injury will improve Outcome: Progressing

## 2023-06-17 NOTE — Discharge Summary (Signed)
Physician Discharge Summary   Patient: Leslie Mason MRN: [id number] DOB: 06-23-1971  Admit date:     06/14/2023  Discharge date: 06/17/23  Discharge Physician: Onalee Hua Shirelle Tootle   PCP: Georgann Housekeeper, MD   Recommendations at discharge:   Please follow up with primary care provider within 1-2 weeks  Please repeat BMP and CBC in one week     Hospital Course: 52 year old female with a history of hypertension, obesity, laparoscopic sleeve gastrectomy (Dr. Gaynelle Adu 06/04/17) and OSA presenting with intractable nausea and vomiting.  The patient states that she began having nausea and vomiting on 06/10/2023 which has gradually worsened on subsequent days.  Is significantly worsened since 06/12/2023 with emesis over 5 times per day.  She denies any hematemesis.  She has had some subjective fever and chills.  She states that she has been on [facility] for about 1 and 1/2 months.  She stated that she had some nausea and decreased appetite since starting [facility].  However has not had any emesis until 06/10/2023.  She has been taking 1 mg/week.  She had been instructed to decrease the dose to 0.5 mg/week, but had not yet received that dose.  Her last dose of Wegovy was 1 mg on 06/09/2023. She denies any frank abdominal pain but states that she does have some abdominal discomfort in general.  She denies any diarrhea, hematochezia, melena.  She denies any dysuria, hematuria.  She denies any headache, neck pain. She denies any new medications.  She has had her chlorthalidone decreased to every other day dosing.  There have been no other medication changes. In the ED, the patient was afebrile hemodynamically stable with oxygen saturation 96-97% on room air.  WBC 6.8, hemoglobin 13.3, platelets 284.  Sodium 130, potassium 3.0, bicarbonate 21, serum creatinine 0.98.  AST 20, ALT 11, alk phosphatase 23, total bili 1.7.  CT abdomen and pelvis showed an indeterminate masslike area in the left adnexa suspicious for an ovarian  lesion.  There is no other potential acute findings elsewhere to account for the patient's symptoms. The patient continued to have nausea and vomiting in the emergency department.  She was started IV fluids.  Admission was requested for her intractable vomiting.  Clear liquid diet and around the clock anti-emetic and pantoprazole bid were started.  Assessment and Plan: Intractable nausea and vomiting -Suspect this may be due to the patient's Wegovy -continued pantoprazole -Started Zofran around-the-clock -Continued IV fluids -Lipase 28 -06/15/2023 CT abdomen and pelvis unremarkable with regard to etiology for her vomiting and abdominal pain -advance to full liquids>>soft diet which pt tolerated   Essential hypertension -Holding chlorthalidone temporarily -Restart metoprolol succinate -after discussion with patient, we mutually agreed to d/c chlorthalidone in the setting of her hyponatremia -she agreed to start amlodipine for her HTN   Hypercalcemia -Corrected calcium 10.8 -Check intact PTH--pending -May be related to the patient's chlorthalidone -improved with IVF   Ovarian lesion -seen on CT abd/pelvis -Pelvic ultrasound--The endometrium and left ovary not visualized due to the fibroiduterus. It is possible that the left ovarian abnormality noted on CT scan of same day may actually represent a fibroid. MRI may be considered for further evaluation. -12/15 MR pelvis--Evidence of uterine didelphys. Normal ovaries. No suspicious ovarian or adnexal masses    Hypokalemia -Repleted -Check magnesium 2.0   Impaired glucose tolerance -06/12/2023 hemoglobin A1c 5.7  Hyponatremia -due to poor solute intake, volume depletion and chlorthalidone -d/c chlorthalidone        Consultants: none Procedures performed: none  Disposition: Home Diet recommendation:  Carb modified diet DISCHARGE MEDICATION: Allergies as of 06/17/2023       Reactions   Contrave [naltrexone-bupropion Hcl  Er] Other (See Comments)   constipation        Medication List     STOP taking these medications    chlorthalidone 25 MG tablet Commonly known as: HYGROTON       TAKE these medications    albuterol 108 (90 Base) MCG/ACT inhaler Commonly known as: VENTOLIN HFA Inhale 2 puffs into the lungs every 6 (six) hours as needed.   amLODipine 5 MG tablet Commonly known as: NORVASC Take 1 tablet (5 mg total) by mouth daily.   CALCIUM 600 PO Take 1 tablet by mouth daily.   clobetasol cream 0.05 % Commonly known as: TEMOVATE Apply 1 application  topically daily as needed (Dermatitis).   metoprolol succinate 25 MG 24 hr tablet Commonly known as: TOPROL-XL Take 1 tablet (25 mg total) by mouth daily.   oxymetazoline 0.05 % nasal spray Commonly known as: AFRIN Place 1 spray into both nostrils 2 (two) times daily as needed for congestion.   Wegovy 0.5 MG/0.5ML Soaj Generic drug: Semaglutide-Weight Management Inject 0.5 mg into the skin once a week. What changed: how much to take        Discharge Exam: Filed Weights   06/15/23 0912  Weight: 87 kg   HEENT:  [place]/AT, No thrush, no icterus CV:  RRR, no rub, no S3, no S4 Lung:  CTA, no wheeze, no rhonchi Abd:  soft/+BS, NT Ext:  No edema, no lymphangitis, no synovitis, no rash   Condition at discharge: stable  The results of significant diagnostics from this hospitalization (including imaging, microbiology, ancillary and laboratory) are listed below for reference.   Imaging Studies: MR PELVIS W WO CONTRAST Result Date: 06/17/2023 CLINICAL DATA:  52 year old female inpatient with pelvic masses, indeterminate for uterine fibroids versus ovarian lesion. EXAM: MRI PELVIS WITHOUT AND WITH CONTRAST TECHNIQUE: Multiplanar multisequence MR imaging of the pelvis was performed both before and after administration of intravenous contrast. CONTRAST:  8.46mL GADAVIST GADOBUTROL 1 MMOL/ML IV SOLN COMPARISON:  06/15/2023 CT abdomen/pelvis  and pelvic ultrasound. FINDINGS: Urinary Tract:  Normal bladder.  Normal urethra. Bowel: Visualized small and large bowel are normal caliber with no bowel wall thickening. Vascular/Lymphatic: No pathologically enlarged lymph nodes in the pelvis. No acute vascular abnormality. Reproductive: Uterus: The enlarged myomatous uterus measures 6.5 x 6.9 x 7.8 cm. There is evidence of uterine didelphys with two distinct cervices on coronal T2 sequences (series 4/image 17). Cervical fibrous stroma appears intact in the uterine cervix. Uterine fibroids are viable and enhancing, as follows: Dominant transmural 6.5 x 4.4 x 4.1 cm right uterine horn fibroid. Several additional smaller uterine fibroids, largest an intramural posterior left uterine body 3.4 x 2.7 x 3.4 cm fibroid. Anterior left uterine body subserosal 2.0 x 1.7 x 2.0 cm fibroid. Endometrium is distorted by the surrounding fibroids, with an estimated 4 mm bilayer endometrial thickness, which is within normal limits. No endometrial cavity fluid or appreciable focal endometrial mass. Ovaries and Adnexa: The right ovary measures 2.1 x 1.4 x 0.8 cm and is normal. The left ovary measures 2.1 x 1.3 x 1.1 cm and is normal. There are no suspicious ovarian or adnexal masses. Other: No abnormal free fluid in the pelvis. No focal pelvic fluid collection. Musculoskeletal: No aggressive appearing focal osseous lesions. Mild degenerative disc disease in the visualized lower lumbar spine. IMPRESSION: 1. Evidence of uterine  didelphys. Enlarged myomatous uterus. Dominant transmural 6.5 cm right uterine horn fibroid. 2. Normal ovaries. No suspicious ovarian or adnexal masses. Electronically Signed   By: Delbert Phenix M.D.   On: 06/17/2023 16:17   US PELVIC COMPLETE WITH TRANSVAGINAL Result Date: 06/15/2023 CLINICAL DATA:  Possible left ovarian cyst. EXAM: TRANSABDOMINAL AND TRANSVAGINAL ULTRASOUND OF PELVIS TECHNIQUE: Both transabdominal and transvaginal ultrasound examinations of  the pelvis were performed. Transabdominal technique was performed for global imaging of the pelvis including uterus, ovaries, adnexal regions, and pelvic cul-de-sac. It was necessary to proceed with endovaginal exam following the transabdominal exam to visualize the endometrium and ovaries. COMPARISON:  CT scan of same day. FINDINGS: Uterus Measurements: 8.7 x 8.2 x 6.8 cm = volume: 254 mL. Two uterine fibroids are noted, the largest measuring 6.4 x 3.9 x 4.4 cm. The other measures 3.4 x 3.3 x 3.0 cm. Endometrium Not visualized due to fibroids obscuring evaluation. Right ovary Measurements: 2.2 x 1.9 x 1.1 cm = volume: 2 mL. Normal appearance/no adnexal mass. Left ovary Not visualized due to fibroids. Other findings No abnormal free fluid. IMPRESSION: Two large uterine fibroids are noted, the largest measuring 6.4 cm. The endometrium and left ovary not visualized due to the fibroid uterus. It is possible that the left ovarian abnormality noted on CT scan of same day may actually represent a fibroid. MRI may be considered for further evaluation. Electronically Signed   By: Lupita Raider M.D.   On: 06/15/2023 09:33   CT ABDOMEN PELVIS W CONTRAST Result Date: 06/15/2023 CLINICAL DATA:  52 year old female with history of nausea and vomiting. Acute onset of nonlocalized abdominal pain. EXAM: CT ABDOMEN AND PELVIS WITH CONTRAST TECHNIQUE: Multidetector CT imaging of the abdomen and pelvis was performed using the standard protocol following bolus administration of intravenous contrast. RADIATION DOSE REDUCTION: This exam was performed according to the departmental dose-optimization program which includes automated exposure control, adjustment of the mA and/or kV according to patient size and/or use of iterative reconstruction technique. CONTRAST:  OMNIPAQUE IOHEXOL 300 MG/ML  SOLN COMPARISON:  No priors. FINDINGS: Lower chest: Unremarkable. Hepatobiliary: Subcentimeter low-attenuation lesion in the posterior  aspect of segment 7 of the liver, too small to characterize, but statistically likely tiny cysts (no imaging follow-up recommended). No suspicious cystic or solid hepatic lesions. No intra or extrahepatic biliary ductal dilatation. Gallbladder is unremarkable in appearance. Pancreas: No pancreatic mass. No pancreatic ductal dilatation. No pancreatic or peripancreatic fluid collections or inflammatory changes. Spleen: Unremarkable. Adrenals/Urinary Tract: Bilateral kidneys and adrenal glands are normal in appearance. No hydroureteronephrosis. Urinary bladder is unremarkable in appearance. Stomach/Bowel: Postoperative changes associated with the stomach suggesting partial gastrectomy. No pathologic dilatation of small bowel or colon. Normal appendix. Vascular/Lymphatic: Atherosclerotic calcifications are noted in the abdominal aorta and pelvic vasculature. No lymphadenopathy noted in the abdomen or pelvis. Reproductive: Uterus and right ovary are unremarkable in appearance. In the left adnexal region (axial image 75 of series 2 and coronal image 50 of series 4) there is a 6.1 x 4.2 x 3.4 cm intermediate attenuation lesion which is likely ovarian in origin. Other: No significant volume of ascites.  No pneumoperitoneum. Musculoskeletal: There are no aggressive appearing lytic or blastic lesions noted in the visualized portions of the skeleton. IMPRESSION: 1. Intermediate attenuation mass-like area in the left adnexa suspicious for probable ovarian lesion. Further evaluation with pelvic ultrasound is strongly recommended to better evaluate this finding and exclude the possibility of neoplasm. 2. No other potential acute findings are noted  elsewhere in the abdomen or pelvis to account for the patient's symptoms. 3. Aortic atherosclerosis. Electronically Signed   By: Trudie Reed M.D.   On: 06/15/2023 06:17    Microbiology: No results found for this or any previous visit.  Labs: CBC: Recent Labs  Lab  06/14/23 2223 06/16/23 0440  WBC 6.9 8.0  NEUTROABS 4.7  --   HGB 13.3 11.9*  HCT 42.0 37.3  MCV 81.7 82.9  PLT 284 241   Basic Metabolic Panel: Recent Labs  Lab 06/12/23 1001 06/14/23 2223 06/16/23 0440 06/17/23 0443  NA 140 138 132* 130*  K 3.7 3.0* 3.6 3.7  CL 99 100 98 96*  CO2 26 21* 23 24  GLUCOSE 75 126* 88 84  BUN 13 16 6 9   CREATININE 0.99 0.98 0.75 0.82  CALCIUM 10.2 10.8* 9.3 9.4  MG  --  1.9 2.0 2.0   Liver Function Tests: Recent Labs  Lab 06/12/23 1001 06/14/23 2223 06/15/23 0933 06/16/23 0440  AST 17 20  --  17  ALT 9 11  --  10  ALKPHOS 90 73  --  64  BILITOT 0.8 1.7* 1.2* 1.4*  PROT 7.3 8.5*  --  7.1  ALBUMIN 4.3 4.4  --  3.7   CBG: No results for input(s): "GLUCAP" in the last 168 hours.  Discharge time spent: greater than 30 minutes.  Signed: Catarina Hartshorn, MD [facility] 06/17/2023

## 2023-06-17 NOTE — Plan of Care (Signed)
  Problem: Health Behavior/Discharge Planning: Goal: Ability to manage health-related needs will improve Outcome: Progressing   Problem: Clinical Measurements: Goal: Ability to maintain clinical measurements within normal limits will improve Outcome: Progressing Goal: Will remain free from infection Outcome: Progressing Goal: Diagnostic test results will improve Outcome: Progressing   Problem: Safety: Goal: Ability to remain free from injury will improve Outcome: Progressing

## 2023-06-18 ENCOUNTER — Other Ambulatory Visit: Payer: Self-pay

## 2023-06-18 ENCOUNTER — Other Ambulatory Visit (HOSPITAL_COMMUNITY): Payer: Self-pay

## 2023-06-19 ENCOUNTER — Other Ambulatory Visit (HOSPITAL_COMMUNITY): Payer: Self-pay

## 2023-06-19 MED ORDER — ONDANSETRON HCL 4 MG PO TABS
4.0000 mg | ORAL_TABLET | Freq: Three times a day (TID) | ORAL | 1 refills | Status: DC | PRN
  Filled 2023-06-19: qty 12, 15d supply, fill #0

## 2023-07-13 ENCOUNTER — Ambulatory Visit: Payer: 59 | Admitting: Plastic Surgery

## 2023-07-17 ENCOUNTER — Encounter (INDEPENDENT_AMBULATORY_CARE_PROVIDER_SITE_OTHER): Payer: Self-pay | Admitting: Family Medicine

## 2023-07-17 ENCOUNTER — Ambulatory Visit (INDEPENDENT_AMBULATORY_CARE_PROVIDER_SITE_OTHER): Payer: No Typology Code available for payment source | Admitting: Family Medicine

## 2023-07-17 VITALS — BP 131/82 | HR 60 | Temp 97.6°F | Ht 68.0 in | Wt 205.0 lb

## 2023-07-17 DIAGNOSIS — E878 Other disorders of electrolyte and fluid balance, not elsewhere classified: Secondary | ICD-10-CM | POA: Diagnosis not present

## 2023-07-17 DIAGNOSIS — E88819 Insulin resistance, unspecified: Secondary | ICD-10-CM

## 2023-07-17 DIAGNOSIS — D509 Iron deficiency anemia, unspecified: Secondary | ICD-10-CM | POA: Diagnosis not present

## 2023-07-17 DIAGNOSIS — E559 Vitamin D deficiency, unspecified: Secondary | ICD-10-CM | POA: Diagnosis not present

## 2023-07-17 DIAGNOSIS — I10 Essential (primary) hypertension: Secondary | ICD-10-CM

## 2023-07-17 DIAGNOSIS — E876 Hypokalemia: Secondary | ICD-10-CM

## 2023-07-17 DIAGNOSIS — E669 Obesity, unspecified: Secondary | ICD-10-CM

## 2023-07-17 DIAGNOSIS — Z6831 Body mass index (BMI) 31.0-31.9, adult: Secondary | ICD-10-CM

## 2023-07-17 NOTE — Progress Notes (Signed)
 .smr  Office: [phone]  /  Fax: [phone]  WEIGHT SUMMARY AND BIOMETRICS  Anthropometric Measurements Height: 5' 8 (1.727 m) Weight: 205 lb (93 kg) BMI (Calculated): 31.18 Weight at Last Visit: 195 lb Weight Lost Since Last Visit: 0 Weight Gained Since Last Visit: 10 lb Starting Weight: 254 lb Total Weight Loss (lbs): 49 lb (22.2 kg) Peak Weight: 340 lb   Body Composition  Body Fat %: 46.8 % Fat Mass (lbs): 96 lbs Muscle Mass (lbs): 103.6 lbs Total Body Water  (lbs): 86 lbs Visceral Fat Rating : 11   Other Clinical Data Fasting: No Labs: No Today's Visit #: 62 Starting Date: 05/26/20    Chief Complaint: OBESITY   History of Present Illness   The patient, with a history of hypertension, obesity, and insulin  resistance, presents for a follow-up visit after a recent hospitalization. The hospitalization was due to severe vomiting following the administration of a 1.0 dose of an unspecified medication. The patient reported an inability to stand up following the administration of the medication. The hospital stay lasted from a Thursday night to a Sunday night, during which the patient experienced fever, chills, and continued vomiting.  During the hospital stay, the patient's calcium levels were found to be increased, potentially due to the use of chlorthalidone . The patient's blood pressure at the current visit was 131/82 without the use of amlodipine .  The patient also reported a recent weight gain of 10 pounds over the past month, despite adhering to a category two eating plan 85% of the time. The patient has not been engaging in regular exercise, such as walking.  During the hospital stay, a CT scan revealed an ovarian lesion and the presence of two distinct uteruses and cervixes. The patient has not had any children and has been seeing the same OBGYN for 20 years without this being previously identified.  The patient's electrolyte levels were also found to be off  during the hospital stay, with low sodium and potassium levels and high calcium levels. The patient's hemoglobin was slightly low at 11.9, and the patient has a history of iron deficiency anemia.  The patient also reported a recent trip to [facility] during which she experienced leg swelling. The patient has been taking clopidogrel for this issue but has not taken it since returning from the trip. The patient also reported increased cravings and has been hydrating more frequently.  The patient has a supply of Zofran  from the hospital stay to manage any potential nausea.          PHYSICAL EXAM:  Blood pressure 131/82, pulse 60, temperature 97.6 F (36.4 C), height 5' 8 (1.727 m), weight 205 lb (93 kg), last menstrual period 04/01/2017, SpO2 100%. Body mass index is 31.17 kg/m.  DIAGNOSTIC DATA REVIEWED:  BMET    Component Value Date/Time   NA 130 (L) 06/17/2023 0443   NA 140 06/12/2023 1001   K 3.7 06/17/2023 0443   CL 96 (L) 06/17/2023 0443   CO2 24 06/17/2023 0443   GLUCOSE 84 06/17/2023 0443   BUN 9 06/17/2023 0443   BUN 13 06/12/2023 1001   CREATININE 0.82 06/17/2023 0443   CALCIUM 9.4 06/17/2023 0443   GFRNONAA >60 06/17/2023 0443   GFRAA 68 05/06/2020 1033   Lab Results  Component Value Date   HGBA1C 5.7 (H) 06/12/2023   HGBA1C 5.7 (H) 06/01/2017   Lab Results  Component Value Date   INSULIN  9.4 06/12/2023   INSULIN  9.1 05/06/2020   Lab Results  Component Value Date   TSH 2.040 04/14/2021   CBC    Component Value Date/Time   WBC 8.0 06/16/2023 0440   RBC 4.50 06/16/2023 0440   HGB 11.9 (L) 06/16/2023 0440   HGB 12.7 08/29/2022 1455   HCT 37.3 06/16/2023 0440   HCT 41.7 08/29/2022 1455   PLT 241 06/16/2023 0440   PLT 268 08/29/2022 1455   MCV 82.9 06/16/2023 0440   MCV 84 08/29/2022 1455   MCH 26.4 06/16/2023 0440   MCHC 31.9 06/16/2023 0440   RDW 14.4 06/16/2023 0440   RDW 13.1 08/29/2022 1455   Iron Studies    Component Value Date/Time   IRON  81 08/29/2022 1455   TIBC 295 08/29/2022 1455   FERRITIN 312 (H) 08/29/2022 1455   IRONPCTSAT 27 08/29/2022 1455   Lipid Panel     Component Value Date/Time   CHOL 203 (H) 06/12/2023 1001   TRIG 62 06/12/2023 1001   HDL 58 06/12/2023 1001   CHOLHDL 3.3 11/08/2020 1037   LDLCALC 134 (H) 06/12/2023 1001   Hepatic Function Panel     Component Value Date/Time   PROT 7.1 06/16/2023 0440   PROT 7.3 06/12/2023 1001   ALBUMIN 3.7 06/16/2023 0440   ALBUMIN 4.3 06/12/2023 1001   AST 17 06/16/2023 0440   ALT 10 06/16/2023 0440   ALKPHOS 64 06/16/2023 0440   BILITOT 1.4 (H) 06/16/2023 0440   BILITOT 0.8 06/12/2023 1001   BILIDIR 0.2 06/15/2023 0933   IBILI 1.0 (H) 06/15/2023 0933      Component Value Date/Time   TSH 2.040 04/14/2021 1014   Nutritional Lab Results  Component Value Date   VD25OH 71.7 06/12/2023   VD25OH 64.8 08/29/2022   VD25OH 85.4 12/05/2021     Assessment and Plan    Hypertension Hypertension is well-controlled with a blood pressure of 131/82 mmHg. Previously on chlorthalidone , held due to hypercalcemia. Restarted on metoprolol ; amlodipine  held. Discussed risks of hypercalcemia and benefits of metoprolol . - Discontinue amlodipine  - Continue metoprolol   Electrolyte Imbalance Recent hospitalization revealed hyponatremia, hypokalemia, and hypercalcemia, likely due to vomiting and dehydration. Discussed risks of electrolyte imbalances, including potential cardiac issues. - Order lab tests for sodium, potassium, calcium, chloride, and magnesium  - Check PTH and iron levels  Iron Deficiency Anemia Iron deficiency anemia with recent hemoglobin of 11.9 g/dL. Monitoring iron levels for adequate stores. Discussed importance of maintaining iron levels and potential need for supplementation. - Order iron studies - Monitor hemoglobin levels  Obesity Gained 10 pounds in the past month, not exercising. Considering restarting Wegovy  at 0.25 mg due to previous adverse  effects at higher doses. Discussed risks of nausea and benefits of weight loss and improved metabolic health. - Restart Wegovy  at 0.25 mg - Continue category two eating plan - Increase physical activity - Monitor weight  General Health Maintenance Requires routine monitoring and follow-up for chronic conditions and recent hospitalization. Discussed importance of regular follow-ups. - Schedule follow-up appointments in February and March.        She was informed of the importance of frequent follow up visits to maximize her success with intensive lifestyle modifications for her multiple health conditions.    Louann Penton, MD

## 2023-07-19 LAB — CBC WITH DIFFERENTIAL/PLATELET
Basophils Absolute: 0 x10E3/uL (ref 0.0–0.2)
Basos: 0 %
EOS (ABSOLUTE): 0.1 x10E3/uL (ref 0.0–0.4)
Eos: 2 %
Hematocrit: 37.2 % (ref 34.0–46.6)
Hemoglobin: 11.5 g/dL (ref 11.1–15.9)
Immature Grans (Abs): 0 x10E3/uL (ref 0.0–0.1)
Immature Granulocytes: 0 %
Lymphocytes Absolute: 1.5 x10E3/uL (ref 0.7–3.1)
Lymphs: 34 %
MCH: 26.4 pg — ABNORMAL LOW (ref 26.6–33.0)
MCHC: 30.9 g/dL — ABNORMAL LOW (ref 31.5–35.7)
MCV: 86 fL (ref 79–97)
Monocytes Absolute: 0.4 x10E3/uL (ref 0.1–0.9)
Monocytes: 8 %
Neutrophils Absolute: 2.5 x10E3/uL (ref 1.4–7.0)
Neutrophils: 56 %
Platelets: 223 x10E3/uL (ref 150–450)
RBC: 4.35 x10E6/uL (ref 3.77–5.28)
RDW: 14.5 % (ref 11.7–15.4)
WBC: 4.5 x10E3/uL (ref 3.4–10.8)

## 2023-07-19 LAB — CMP14+EGFR
ALT: 12 [IU]/L (ref 0–32)
AST: 19 [IU]/L (ref 0–40)
Albumin: 4.2 g/dL (ref 3.8–4.9)
Alkaline Phosphatase: 93 [IU]/L (ref 44–121)
BUN/Creatinine Ratio: 19 (ref 9–23)
BUN: 17 mg/dL (ref 6–24)
Bilirubin Total: 0.5 mg/dL (ref 0.0–1.2)
CO2: 27 mmol/L (ref 20–29)
Calcium: 9.6 mg/dL (ref 8.7–10.2)
Chloride: 101 mmol/L (ref 96–106)
Creatinine, Ser: 0.9 mg/dL (ref 0.57–1.00)
Globulin, Total: 2.4 g/dL (ref 1.5–4.5)
Glucose: 78 mg/dL (ref 70–99)
Potassium: 4.3 mmol/L (ref 3.5–5.2)
Sodium: 141 mmol/L (ref 134–144)
Total Protein: 6.6 g/dL (ref 6.0–8.5)
eGFR: 76 mL/min/{1.73_m2} (ref >59)

## 2023-07-19 LAB — VITAMIN B12: Vitamin B-12: 242 pg/mL (ref 232–1245)

## 2023-07-19 LAB — IRON AND TIBC
Iron Saturation: 28 % (ref 15–55)
Iron: 85 ug/dL (ref 27–159)
Total Iron Binding Capacity: 300 ug/dL (ref 250–450)
UIBC: 215 ug/dL (ref 131–425)

## 2023-07-19 LAB — FERRITIN: Ferritin: 239 ng/mL — ABNORMAL HIGH (ref 15–150)

## 2023-07-19 LAB — HEMOGLOBIN A1C
Est. average glucose Bld gHb Est-mCnc: 103 mg/dL
Hgb A1c MFr Bld: 5.2 % (ref 4.8–5.6)

## 2023-07-19 LAB — VITAMIN D 25 HYDROXY (VIT D DEFICIENCY, FRACTURES): Vit D, 25-Hydroxy: 54.1 ng/mL (ref 30.0–100.0)

## 2023-07-19 LAB — FOLATE: Folate: 5.3 ng/mL

## 2023-07-20 ENCOUNTER — Other Ambulatory Visit (INDEPENDENT_AMBULATORY_CARE_PROVIDER_SITE_OTHER): Payer: Self-pay | Admitting: Family Medicine

## 2023-07-20 ENCOUNTER — Encounter (INDEPENDENT_AMBULATORY_CARE_PROVIDER_SITE_OTHER): Payer: Self-pay | Admitting: Family Medicine

## 2023-07-20 ENCOUNTER — Other Ambulatory Visit (HOSPITAL_COMMUNITY): Payer: Self-pay

## 2023-07-20 DIAGNOSIS — E66812 Obesity, class 2: Secondary | ICD-10-CM

## 2023-07-23 ENCOUNTER — Other Ambulatory Visit (HOSPITAL_COMMUNITY): Payer: Self-pay

## 2023-07-23 MED ORDER — WEGOVY 0.25 MG/0.5ML ~~LOC~~ SOAJ
0.2500 mg | SUBCUTANEOUS | 0 refills | Status: DC
  Filled 2023-07-23 – 2023-07-25 (×2): qty 2, 28d supply, fill #0

## 2023-07-24 ENCOUNTER — Other Ambulatory Visit (HOSPITAL_COMMUNITY): Payer: Self-pay

## 2023-07-25 ENCOUNTER — Other Ambulatory Visit (HOSPITAL_COMMUNITY): Payer: Self-pay

## 2023-08-14 ENCOUNTER — Ambulatory Visit (INDEPENDENT_AMBULATORY_CARE_PROVIDER_SITE_OTHER): Payer: No Typology Code available for payment source | Admitting: Family Medicine

## 2023-08-14 ENCOUNTER — Encounter (INDEPENDENT_AMBULATORY_CARE_PROVIDER_SITE_OTHER): Payer: Self-pay | Admitting: Family Medicine

## 2023-08-14 ENCOUNTER — Other Ambulatory Visit (HOSPITAL_COMMUNITY): Payer: Self-pay

## 2023-08-14 VITALS — BP 146/83 | HR 60 | Temp 98.3°F | Ht 68.0 in | Wt 205.0 lb

## 2023-08-14 DIAGNOSIS — R632 Polyphagia: Secondary | ICD-10-CM

## 2023-08-14 DIAGNOSIS — I10 Essential (primary) hypertension: Secondary | ICD-10-CM

## 2023-08-14 DIAGNOSIS — E669 Obesity, unspecified: Secondary | ICD-10-CM

## 2023-08-14 DIAGNOSIS — Z6831 Body mass index (BMI) 31.0-31.9, adult: Secondary | ICD-10-CM | POA: Diagnosis not present

## 2023-08-14 MED ORDER — WEGOVY 0.5 MG/0.5ML ~~LOC~~ SOAJ
0.5000 mg | SUBCUTANEOUS | 0 refills | Status: DC
  Filled 2023-08-14: qty 2, 28d supply, fill #0

## 2023-08-14 MED ORDER — METOPROLOL SUCCINATE ER 25 MG PO TB24
25.0000 mg | ORAL_TABLET | Freq: Every day | ORAL | 0 refills | Status: DC
  Filled 2023-08-14: qty 30, 30d supply, fill #0

## 2023-08-14 NOTE — Progress Notes (Signed)
.smr  Office: [phone]  /  Fax: [phone]  WEIGHT SUMMARY AND BIOMETRICS  Anthropometric Measurements Height: 5\' 8"  (1.727 m) Weight: 205 lb (93 kg) BMI (Calculated): 31.18 Weight at Last Visit: 205 lb Weight Lost Since Last Visit: 0 Weight Gained Since Last Visit: 0 Starting Weight: 254 lb Total Weight Loss (lbs): 49 lb (22.2 kg) Peak Weight: 340 lb   Body Composition  Body Fat %: 44.4 % Fat Mass (lbs): 91.2 lbs Muscle Mass (lbs): 108.4 lbs Total Body Water (lbs): 83 lbs Visceral Fat Rating : 11   Other Clinical Data Fasting: Yes Labs: No Today's Visit #: 50 Starting Date: 05/26/20    Chief Complaint: OBESITY   History of Present Illness   Leslie Mason is a 53 year old female with obesity and hypertension who presents to discuss her obesity management.  She has been maintaining her weight over the last month and adheres to her category two eating plan approximately 65% of the time. She engages in physical activity by walking most days of the week for 35 to 40 minutes. She is currently on Wegovy for polyphagia and experiences no nausea or other adverse effects. She has a box of 0.5 mg Wegovy at home and one more 0.25 mg dose remaining.  Her blood pressure readings today are elevated. She is currently taking Toprol XL and Norvasc for her hypertension and requests a refill of her Toprol XL. She attributes the recent elevation in blood pressure to dietary choices, including consuming high sodium foods during a Super Bowl party. She also reports stress from work and family responsibilities, which may contribute to her blood pressure issues.  During a recent work trip, she experienced stress due to work-related issues and had limited food options, resulting in eating at [employer] every night. She managed her budget by choosing meals that fit within her allowance. She also reports stress related to managing her aunt's estate and dealing with Medicaid, which adds to  her overall stress levels.          PHYSICAL EXAM:  Blood pressure (!) 146/83, pulse 60, temperature 98.3 F (36.8 C), height 5\' 8"  (1.727 m), weight 205 lb (93 kg), last menstrual period 04/01/2017, SpO2 100%. Body mass index is 31.17 kg/m.  DIAGNOSTIC DATA REVIEWED:  BMET    Component Value Date/Time   NA 141 07/17/2023 0942   K 4.3 07/17/2023 0942   CL 101 07/17/2023 0942   CO2 27 07/17/2023 0942   GLUCOSE 78 07/17/2023 0942   GLUCOSE 84 06/17/2023 0443   BUN 17 07/17/2023 0942   CREATININE 0.90 07/17/2023 0942   CALCIUM 9.6 07/17/2023 0942   GFRNONAA >60 06/17/2023 0443   GFRAA 68 05/06/2020 1033   Lab Results  Component Value Date   HGBA1C 5.2 07/17/2023   HGBA1C 5.7 (H) 06/01/2017   Lab Results  Component Value Date   INSULIN 9.4 06/12/2023   INSULIN 9.1 05/06/2020   Lab Results  Component Value Date   TSH 2.040 04/14/2021   CBC    Component Value Date/Time   WBC 4.5 07/17/2023 0942   WBC 8.0 06/16/2023 0440   RBC 4.35 07/17/2023 0942   RBC 4.50 06/16/2023 0440   HGB 11.5 07/17/2023 0942   HCT 37.2 07/17/2023 0942   PLT 223 07/17/2023 0942   MCV 86 07/17/2023 0942   MCH 26.4 (L) 07/17/2023 0942   MCH 26.4 06/16/2023 0440   MCHC 30.9 (L) 07/17/2023 0942   MCHC 31.9 06/16/2023 0440   RDW  14.5 07/17/2023 0942   Iron Studies    Component Value Date/Time   IRON 85 07/17/2023 0942   TIBC 300 07/17/2023 0942   FERRITIN 239 (H) 07/17/2023 0942   IRONPCTSAT 28 07/17/2023 0942   Lipid Panel     Component Value Date/Time   CHOL 203 (H) 06/12/2023 1001   TRIG 62 06/12/2023 1001   HDL 58 06/12/2023 1001   CHOLHDL 3.3 11/08/2020 1037   LDLCALC 134 (H) 06/12/2023 1001   Hepatic Function Panel     Component Value Date/Time   PROT 6.6 07/17/2023 0942   ALBUMIN 4.2 07/17/2023 0942   AST 19 07/17/2023 0942   ALT 12 07/17/2023 0942   ALKPHOS 93 07/17/2023 0942   BILITOT 0.5 07/17/2023 0942   BILIDIR 0.2 06/15/2023 0933   IBILI 1.0 (H)  06/15/2023 0933      Component Value Date/Time   TSH 2.040 04/14/2021 1014   Nutritional Lab Results  Component Value Date   VD25OH 54.1 07/17/2023   VD25OH 71.7 06/12/2023   VD25OH 64.8 08/29/2022     Assessment and Plan    Hypertension Blood pressure elevated at 145/87 and 146/83 on recheck. Currently on Toprol XL and Norvasc. Attributed to recent high sodium intake and stress. Discussed importance of regular monitoring, stress management, and sodium intake control to reduce cardiovascular risk. - Refill Toprol XL - Monitor blood pressure and recheck at next visit  Obesity with Polyphagia Maintaining weight, following category two eating plan 65% of the time. Walking 35-40 minutes most days. On Wegovy 0.25 mg for polyphagia, tolerated without nausea. Plan to increase to 0.5 mg for better appetite control and weight loss. No adverse effects noted with 0.25 mg dose. - Increase Wegovy to 0.5 mg - Send prescription for Wegovy 0.5 mg -Eating out strategies discussed  General Health Maintenance Engaging in regular physical activity, following healthy eating plan, managing stress. Discussed importance of hydration, especially after high sodium intake, to manage blood pressure. - Continue regular physical activity - Continue category two eating plan - Hydrate adequately, especially after high sodium intake.        She was informed of the importance of frequent follow up visits to maximize her success with intensive lifestyle modifications for her multiple health conditions.    Quillian Quince, MD

## 2023-08-17 ENCOUNTER — Other Ambulatory Visit (HOSPITAL_COMMUNITY): Payer: Self-pay

## 2023-09-04 ENCOUNTER — Other Ambulatory Visit (INDEPENDENT_AMBULATORY_CARE_PROVIDER_SITE_OTHER): Payer: Self-pay | Admitting: Family Medicine

## 2023-09-04 DIAGNOSIS — R632 Polyphagia: Secondary | ICD-10-CM

## 2023-09-04 NOTE — Telephone Encounter (Signed)
 LAST APPOINTMENT DATE: 08/14/2023 NEXT APPOINTMENT DATE: 10/08/2023   [id number] - [place], [place] - [address] [place] [address] Phone: [phone] Fax: [phone]  [place] [facility] [address] [place] [address] Phone: [phone] Fax: [phone]  Patient is requesting a refill of the following medications: Requested Prescriptions   Pending Prescriptions Disp Refills   Semaglutide-Weight Management (WEGOVY) 0.5 MG/0.5ML SOAJ 2 mL 0    Sig: Inject 0.5 mg into the skin once a week.    Date last filled: 08/14/2023 Previously prescribed by Dr Dalbert Garnet  Lab Results  Component Value Date   HGBA1C 5.2 07/17/2023   HGBA1C 5.7 (H) 06/12/2023   HGBA1C 5.4 08/29/2022   Lab Results  Component Value Date   LDLCALC 134 (H) 06/12/2023   CREATININE 0.90 07/17/2023   Lab Results  Component Value Date   VD25OH 54.1 07/17/2023   VD25OH 71.7 06/12/2023   VD25OH 64.8 08/29/2022    BP Readings from Last 3 Encounters:  08/14/23 (!) 146/83  07/17/23 131/82  06/17/23 (!) 140/93

## 2023-09-05 ENCOUNTER — Other Ambulatory Visit (HOSPITAL_COMMUNITY): Payer: Self-pay

## 2023-09-05 MED ORDER — WEGOVY 0.5 MG/0.5ML ~~LOC~~ SOAJ
0.5000 mg | SUBCUTANEOUS | 0 refills | Status: DC
  Filled 2023-09-05: qty 2, 28d supply, fill #0

## 2023-09-13 ENCOUNTER — Other Ambulatory Visit (INDEPENDENT_AMBULATORY_CARE_PROVIDER_SITE_OTHER): Payer: Self-pay | Admitting: Family Medicine

## 2023-09-13 DIAGNOSIS — I10 Essential (primary) hypertension: Secondary | ICD-10-CM

## 2023-09-17 ENCOUNTER — Ambulatory Visit (INDEPENDENT_AMBULATORY_CARE_PROVIDER_SITE_OTHER): Payer: No Typology Code available for payment source | Admitting: Family Medicine

## 2023-09-17 ENCOUNTER — Encounter (HOSPITAL_COMMUNITY): Payer: Self-pay

## 2023-09-17 ENCOUNTER — Other Ambulatory Visit (HOSPITAL_COMMUNITY): Payer: Self-pay

## 2023-09-19 ENCOUNTER — Other Ambulatory Visit (HOSPITAL_COMMUNITY): Payer: Self-pay

## 2023-09-19 ENCOUNTER — Other Ambulatory Visit (INDEPENDENT_AMBULATORY_CARE_PROVIDER_SITE_OTHER): Payer: Self-pay

## 2023-09-21 ENCOUNTER — Other Ambulatory Visit (HOSPITAL_COMMUNITY): Payer: Self-pay

## 2023-09-21 MED ORDER — METOPROLOL SUCCINATE ER 25 MG PO TB24
25.0000 mg | ORAL_TABLET | Freq: Every day | ORAL | 5 refills | Status: AC
  Filled 2023-09-21: qty 30, 30d supply, fill #0
  Filled 2023-10-24: qty 30, 30d supply, fill #1
  Filled 2023-11-28: qty 30, 30d supply, fill #2
  Filled 2023-12-25: qty 30, 30d supply, fill #3
  Filled 2024-01-24: qty 30, 30d supply, fill #4
  Filled 2024-02-27: qty 30, 30d supply, fill #5
  Filled 2024-03-31: qty 30, 30d supply, fill #6
  Filled 2024-05-02: qty 30, 30d supply, fill #7
  Filled 2024-06-10: qty 30, 30d supply, fill #8
  Filled 2024-07-10: qty 30, 30d supply, fill #9

## 2023-09-25 ENCOUNTER — Ambulatory Visit (INDEPENDENT_AMBULATORY_CARE_PROVIDER_SITE_OTHER): Payer: No Typology Code available for payment source | Admitting: Family Medicine

## 2023-10-08 ENCOUNTER — Other Ambulatory Visit (HOSPITAL_COMMUNITY): Payer: Self-pay

## 2023-10-08 ENCOUNTER — Ambulatory Visit (INDEPENDENT_AMBULATORY_CARE_PROVIDER_SITE_OTHER): Admitting: Family Medicine

## 2023-10-08 DIAGNOSIS — I10 Essential (primary) hypertension: Secondary | ICD-10-CM

## 2023-10-08 DIAGNOSIS — E669 Obesity, unspecified: Secondary | ICD-10-CM

## 2023-10-08 DIAGNOSIS — Z6830 Body mass index (BMI) 30.0-30.9, adult: Secondary | ICD-10-CM

## 2023-10-08 DIAGNOSIS — R632 Polyphagia: Secondary | ICD-10-CM

## 2023-10-08 MED ORDER — WEGOVY 0.5 MG/0.5ML ~~LOC~~ SOAJ
0.5000 mg | SUBCUTANEOUS | 0 refills | Status: DC
  Filled 2023-10-08 – 2023-10-11 (×2): qty 6, 84d supply, fill #0

## 2023-10-08 NOTE — Progress Notes (Signed)
 Office: [phone]  /  Fax: [phone]  WEIGHT SUMMARY AND BIOMETRICS  Anthropometric Measurements Height: 5\' 8"  (1.727 m) Weight: 200 lb (90.7 kg) BMI (Calculated): 30.42 Weight at Last Visit: 205 lb Weight Lost Since Last Visit: 5 lb Weight Gained Since Last Visit: 0 Starting Weight: 254 lb Total Weight Loss (lbs): 54 lb (24.5 kg) Peak Weight: 340 lb   Body Composition  Body Fat %: 42.3 % Fat Mass (lbs): 84.6 lbs Muscle Mass (lbs): 109.8 lbs Total Body Water (lbs): 82.6 lbs Visceral Fat Rating : 10   Other Clinical Data Fasting: no Labs: no Today's Visit #: 51 Starting Date: 05/26/20    Chief Complaint: OBESITY   History of Present Illness Leslie Mason is a 53 year old female who presents for obesity treatment and progress assessment.  She is adhering to a category two eating plan 55% of the time and engages in walking for exercise most days of the week for 45 minutes. Over the last two months, she has lost five pounds, indicating progress in her weight management efforts.  She is currently taking Wegovy 0.5 mg weekly for obesity management. She has encountered issues with the delivery of Wegovy due to [employer] and insurance complications, specifically with [facility], which she is reluctant to use for virtual consultations.  Her work-related travel has impacted her meal choices, often resorting to chain restaurants like [employer] due to limited options. Despite this, she has managed to maintain her weight loss by being frugal and strategic with her meal planning.  She is also on metoprolol and Norvasc for hypertension management. She experienced a delay in getting her metoprolol prescription refilled due to a miscommunication between her pharmacy and the prescribing doctor's office, which she resolved by directly contacting the office.      PHYSICAL EXAM:  Blood pressure 133/75, pulse 79, temperature 98.3 F (36.8 C), height 5\' 8"  (1.727 m), weight 200  lb (90.7 kg), last menstrual period 04/01/2017, SpO2 (!) 62%. Body mass index is 30.41 kg/m.  DIAGNOSTIC DATA REVIEWED:  BMET    Component Value Date/Time   NA 141 07/17/2023 0942   K 4.3 07/17/2023 0942   CL 101 07/17/2023 0942   CO2 27 07/17/2023 0942   GLUCOSE 78 07/17/2023 0942   GLUCOSE 84 06/17/2023 0443   BUN 17 07/17/2023 0942   CREATININE 0.90 07/17/2023 0942   CALCIUM 9.6 07/17/2023 0942   GFRNONAA >60 06/17/2023 0443   GFRAA 68 05/06/2020 1033   Lab Results  Component Value Date   HGBA1C 5.2 07/17/2023   HGBA1C 5.7 (H) 06/01/2017   Lab Results  Component Value Date   INSULIN 9.4 06/12/2023   INSULIN 9.1 05/06/2020   Lab Results  Component Value Date   TSH 2.040 04/14/2021   CBC    Component Value Date/Time   WBC 4.5 07/17/2023 0942   WBC 8.0 06/16/2023 0440   RBC 4.35 07/17/2023 0942   RBC 4.50 06/16/2023 0440   HGB 11.5 07/17/2023 0942   HCT 37.2 07/17/2023 0942   PLT 223 07/17/2023 0942   MCV 86 07/17/2023 0942   MCH 26.4 (L) 07/17/2023 0942   MCH 26.4 06/16/2023 0440   MCHC 30.9 (L) 07/17/2023 0942   MCHC 31.9 06/16/2023 0440   RDW 14.5 07/17/2023 0942   Iron Studies    Component Value Date/Time   IRON 85 07/17/2023 0942   TIBC 300 07/17/2023 0942   FERRITIN 239 (H) 07/17/2023 0942   IRONPCTSAT 28 07/17/2023 0942  Lipid Panel     Component Value Date/Time   CHOL 203 (H) 06/12/2023 1001   TRIG 62 06/12/2023 1001   HDL 58 06/12/2023 1001   CHOLHDL 3.3 11/08/2020 1037   LDLCALC 134 (H) 06/12/2023 1001   Hepatic Function Panel     Component Value Date/Time   PROT 6.6 07/17/2023 0942   ALBUMIN 4.2 07/17/2023 0942   AST 19 07/17/2023 0942   ALT 12 07/17/2023 0942   ALKPHOS 93 07/17/2023 0942   BILITOT 0.5 07/17/2023 0942   BILIDIR 0.2 06/15/2023 0933   IBILI 1.0 (H) 06/15/2023 0933      Component Value Date/Time   TSH 2.040 04/14/2021 1014   Nutritional Lab Results  Component Value Date   VD25OH 54.1 07/17/2023   VD25OH  71.7 06/12/2023   VD25OH 64.8 08/29/2022     Assessment and Plan Assessment & Plan Obesity She is following a category two eating plan 55% of the time and engaging in regular physical activity, walking most days for 45 minutes. She has lost 5 pounds over the last two months, indicating progress in weight management. She is tolerating Wegovy well, which aids in weight loss. There is concern about insurance changes affecting the prescription process, but she prefers in-person consultations over virtual health management. She strongly prefers continuing her current treatment plan with her physician. - Continue Wegovy 0.5 mg weekly - Continue category two eating plan - Encourage regular physical activity - Attempt to obtain a 90-day supply of Wegovy to avoid insurance complications  Polyphagia Polyphagia is managed with WGNFAO, which she is tolerating well. The medication contributes to both weight loss and management of polyphagia. - Continue Wegovy 0.5 mg weekly  Hypertension Initial blood pressure was elevated at 151/101 mmHg, but improved to 133/75 mmHg on repeat measurement. She is on Norvasc and metoprolol for blood pressure management. A recent issue with metoprolol refill, which caused significant stress, has been resolved. - Continue Norvasc - Continue metoprolol -  Continue category two eating plan - Encourage regular physical activity  Follow-up She has a follow-up appointment scheduled for May 5th at 9:20 AM. An additional appointment is needed after this date to accommodate her travel plans. - Attend follow-up appointment on May 5th at 9:20 AM - Schedule an additional follow-up appointment after May 5th     She was informed of the importance of frequent follow up visits to maximize her success with intensive lifestyle modifications for her multiple health conditions.    Quillian Quince, MD

## 2023-10-09 ENCOUNTER — Encounter (INDEPENDENT_AMBULATORY_CARE_PROVIDER_SITE_OTHER): Payer: Self-pay | Admitting: *Deleted

## 2023-10-11 ENCOUNTER — Other Ambulatory Visit (HOSPITAL_COMMUNITY): Payer: Self-pay

## 2023-10-11 MED ORDER — WEGOVY 0.5 MG/0.5ML ~~LOC~~ SOAJ
0.5000 mg | SUBCUTANEOUS | 0 refills | Status: DC
Start: 2023-10-11 — End: 2023-12-04
  Filled 2023-10-11: qty 2, 28d supply, fill #0

## 2023-10-12 ENCOUNTER — Other Ambulatory Visit: Payer: Self-pay

## 2023-10-16 NOTE — Telephone Encounter (Signed)
 CREATED IN ERROR

## 2023-10-23 ENCOUNTER — Ambulatory Visit (INDEPENDENT_AMBULATORY_CARE_PROVIDER_SITE_OTHER): Payer: No Typology Code available for payment source | Admitting: Family Medicine

## 2023-11-02 ENCOUNTER — Other Ambulatory Visit (HOSPITAL_COMMUNITY): Payer: Self-pay

## 2023-11-02 MED ORDER — WEGOVY 0.5 MG/0.5ML ~~LOC~~ SOAJ
0.5000 mg | SUBCUTANEOUS | 0 refills | Status: DC
Start: 2023-11-02 — End: 2023-12-04
  Filled 2023-11-02: qty 2, 28d supply, fill #0

## 2023-11-05 ENCOUNTER — Encounter (INDEPENDENT_AMBULATORY_CARE_PROVIDER_SITE_OTHER): Payer: Self-pay | Admitting: Family Medicine

## 2023-11-05 ENCOUNTER — Ambulatory Visit (INDEPENDENT_AMBULATORY_CARE_PROVIDER_SITE_OTHER): Admitting: Family Medicine

## 2023-11-05 VITALS — BP 153/102 | HR 62 | Temp 97.7°F | Ht 68.0 in | Wt 202.0 lb

## 2023-11-05 DIAGNOSIS — E669 Obesity, unspecified: Secondary | ICD-10-CM

## 2023-11-05 DIAGNOSIS — Z6830 Body mass index (BMI) 30.0-30.9, adult: Secondary | ICD-10-CM

## 2023-11-05 DIAGNOSIS — I10 Essential (primary) hypertension: Secondary | ICD-10-CM | POA: Diagnosis not present

## 2023-11-05 NOTE — Progress Notes (Signed)
 Office: [phone]  /  Fax: [phone]  WEIGHT SUMMARY AND BIOMETRICS  Anthropometric Measurements Height: 5\' 8"  (1.727 m) Weight: 202 lb (91.6 kg) BMI (Calculated): 30.72 Weight at Last Visit: 200 lb Weight Lost Since Last Visit: 0 Weight Gained Since Last Visit: 2 lb Starting Weight: 254 lb Total Weight Loss (lbs): 52 lb (23.6 kg) Peak Weight: 340 lb   Body Composition  Body Fat %: 42.8 % Fat Mass (lbs): 86.6 lbs Muscle Mass (lbs): 109.8 lbs Total Body Water  (lbs): 80.8 lbs Visceral Fat Rating : 10   Other Clinical Data Fasting: No Labs: No Today's Visit #: 51 Starting Date: 05/26/20    Chief Complaint: OBESITY    History of Present Illness Leslie Mason is a 53 year old female with obesity and hypertension who presents for obesity treatment plan assessment and progress evaluation.  She is adhering to her category two eating plan approximately 65% of the time and engages in walking for exercise for 45 minutes, five days a week. Despite these efforts, she has experienced a weight gain of two pounds over the past month. She is currently using Wegovy  for weight management, having restarted it in mid-January, and reports no gastrointestinal issues or nausea with the medication. She is cautious about increasing the dose due to previous side effects.  She has a history of hypertension and is currently on metoprolol  and amlodipine . Her blood pressure readings today were 154/107 and 154/102. She recently acquired a new blood pressure machine for home monitoring. She has not been taking amlodipine  as previously advised due to concerns about swelling, which she experiences.  Her recent work-related travel to [place] posed challenges to her dietary plan due to limited healthy food options. She managed to maintain some dietary control by opting for healthier choices, such as sea bass over steak. Her work travel impacts her ability to maintain a consistent healthy diet,  and she finds it challenging to adhere to her dietary plan with a $75 daily stipend for meals.      PHYSICAL EXAM:  Blood pressure (!) 153/102, pulse 62, temperature 97.7 F (36.5 C), height 5\' 8"  (1.727 m), weight 202 lb (91.6 kg), last menstrual period 04/01/2017, SpO2 100%. Body mass index is 30.71 kg/m.  DIAGNOSTIC DATA REVIEWED:  BMET    Component Value Date/Time   NA 141 07/17/2023 0942   K 4.3 07/17/2023 0942   CL 101 07/17/2023 0942   CO2 27 07/17/2023 0942   GLUCOSE 78 07/17/2023 0942   GLUCOSE 84 06/17/2023 0443   BUN 17 07/17/2023 0942   CREATININE 0.90 07/17/2023 0942   CALCIUM 9.6 07/17/2023 0942   GFRNONAA >60 06/17/2023 0443   GFRAA 68 05/06/2020 1033   Lab Results  Component Value Date   HGBA1C 5.2 07/17/2023   HGBA1C 5.7 (H) 06/01/2017   Lab Results  Component Value Date   INSULIN  9.4 06/12/2023   INSULIN  9.1 05/06/2020   Lab Results  Component Value Date   TSH 2.040 04/14/2021   CBC    Component Value Date/Time   WBC 4.5 07/17/2023 0942   WBC 8.0 06/16/2023 0440   RBC 4.35 07/17/2023 0942   RBC 4.50 06/16/2023 0440   HGB 11.5 07/17/2023 0942   HCT 37.2 07/17/2023 0942   PLT 223 07/17/2023 0942   MCV 86 07/17/2023 0942   MCH 26.4 (L) 07/17/2023 0942   MCH 26.4 06/16/2023 0440   MCHC 30.9 (L) 07/17/2023 0942   MCHC 31.9 06/16/2023 0440   RDW 14.5  07/17/2023 0942   Iron Studies    Component Value Date/Time   IRON 85 07/17/2023 0942   TIBC 300 07/17/2023 0942   FERRITIN 239 (H) 07/17/2023 0942   IRONPCTSAT 28 07/17/2023 0942   Lipid Panel     Component Value Date/Time   CHOL 203 (H) 06/12/2023 1001   TRIG 62 06/12/2023 1001   HDL 58 06/12/2023 1001   CHOLHDL 3.3 11/08/2020 1037   LDLCALC 134 (H) 06/12/2023 1001   Hepatic Function Panel     Component Value Date/Time   PROT 6.6 07/17/2023 0942   ALBUMIN 4.2 07/17/2023 0942   AST 19 07/17/2023 0942   ALT 12 07/17/2023 0942   ALKPHOS 93 07/17/2023 0942   BILITOT 0.5  07/17/2023 0942   BILIDIR 0.2 06/15/2023 0933   IBILI 1.0 (H) 06/15/2023 0933      Component Value Date/Time   TSH 2.040 04/14/2021 1014   Nutritional Lab Results  Component Value Date   VD25OH 54.1 07/17/2023   VD25OH 71.7 06/12/2023   VD25OH 64.8 08/29/2022     Assessment and Plan Assessment & Plan Hypertension Hypertension remains elevated with current readings of 154/107 and 154/102. She is on metoprolol  and has not been taking amlodipine  as previously advised by another physician. Discussed the potential side effect of amlodipine , which includes fluid retention in the ankles, a concern for her. - Monitor blood pressure at home and report readings. - Start amlodipine  as prescribed  Obesity Obesity management is ongoing. She is following a category two eating plan 65% of the time and exercises by walking 45 minutes, five days a week. Despite these efforts, there has been a weight gain of two pounds in the last month. She is on Wegovy , which is being managed by a work program. Discussed the option of increasing the dose of Wegovy  but decided against it due to previous adverse reactions and current satisfaction with the dose. - Continue current dose of Wegovy  as prescribed by the work program. - Encourage adherence to the eating plan and reduce eating out. - Follow up in four weeks to reassess progress.    She was informed of the importance of frequent follow up visits to maximize her success with intensive lifestyle modifications for her multiple health conditions.    Jasmine Mesi, MD

## 2023-11-30 ENCOUNTER — Other Ambulatory Visit (HOSPITAL_COMMUNITY): Payer: Self-pay

## 2023-11-30 MED ORDER — WEGOVY 0.5 MG/0.5ML ~~LOC~~ SOAJ
0.5000 mg | SUBCUTANEOUS | 0 refills | Status: DC
  Filled 2023-11-30 (×2): qty 2, 28d supply, fill #0

## 2023-11-30 MED ORDER — WEGOVY 1 MG/0.5ML ~~LOC~~ SOAJ
1.0000 mg | SUBCUTANEOUS | 0 refills | Status: DC
  Filled 2023-11-30: qty 2, 28d supply, fill #0

## 2023-12-04 ENCOUNTER — Encounter (INDEPENDENT_AMBULATORY_CARE_PROVIDER_SITE_OTHER): Payer: Self-pay | Admitting: Family Medicine

## 2023-12-04 ENCOUNTER — Ambulatory Visit (INDEPENDENT_AMBULATORY_CARE_PROVIDER_SITE_OTHER): Admitting: Family Medicine

## 2023-12-04 VITALS — BP 143/99 | HR 66 | Temp 97.9°F | Ht 68.0 in | Wt 200.0 lb

## 2023-12-04 DIAGNOSIS — I10 Essential (primary) hypertension: Secondary | ICD-10-CM | POA: Diagnosis not present

## 2023-12-04 DIAGNOSIS — E669 Obesity, unspecified: Secondary | ICD-10-CM | POA: Diagnosis not present

## 2023-12-04 DIAGNOSIS — E66812 Morbid (severe) obesity due to excess calories: Secondary | ICD-10-CM

## 2023-12-04 DIAGNOSIS — R632 Polyphagia: Secondary | ICD-10-CM

## 2023-12-04 DIAGNOSIS — Z6830 Body mass index (BMI) 30.0-30.9, adult: Secondary | ICD-10-CM

## 2023-12-04 NOTE — Progress Notes (Signed)
 Office: [phone]  /  Fax: [phone]  WEIGHT SUMMARY AND BIOMETRICS  Anthropometric Measurements Height: 5\' 8"  (1.727 m) Weight: 200 lb (90.7 kg) BMI (Calculated): 30.42 Weight at Last Visit: 200 lb Weight Lost Since Last Visit: 2 lb Weight Gained Since Last Visit: 0 Starting Weight: 254 lb Total Weight Loss (lbs): 54 lb (24.5 kg) Peak Weight: 340 lb   Body Composition  Body Fat %: 42.8 % Fat Mass (lbs): 85.8 lbs Muscle Mass (lbs): 108.8 lbs Total Body Water  (lbs): 80.2 lbs Visceral Fat Rating : 10   Other Clinical Data Fasting: yes Labs: no Today's Visit #: 42 Starting Date: 05/26/20    Chief Complaint: OBESITY   History of Present Illness Leslie Mason is a 53 year old female who presents for obesity treatment and progress assessment.  She is adhering to her category two eating plan 85% of the time and engages in physical activity by walking for 45 minutes, five times per week. She has lost two pounds in the last month since her last visit.  Her blood pressure was elevated today, but at home, it was 124/80 on Sunday. She checks her blood pressure weekly. She is currently taking amlodipine  5 mg and metoprolol  XL 25 mg daily for hypertension.  She is taking Wegovy  for polyphagia and requests a refill. Hunger is not much of an issue, but she experiences cravings, particularly in the evening. She often eats late and sometimes skips meals, leading to cravings for sweets.  She has ordered a squat machine to enhance her exercise routine and is waiting for warmer weather to utilize her garage for workouts. She expresses a fear of encountering snakes outdoors, which affects her willingness to exercise outside.      PHYSICAL EXAM:  Blood pressure (!) 143/99, pulse 66, temperature 97.9 F (36.6 C), height 5\' 8"  (1.727 m), weight 200 lb (90.7 kg), last menstrual period 04/01/2017, SpO2 100%. Body mass index is 30.41 kg/m.  DIAGNOSTIC DATA REVIEWED:  BMET     Component Value Date/Time   NA 141 07/17/2023 0942   K 4.3 07/17/2023 0942   CL 101 07/17/2023 0942   CO2 27 07/17/2023 0942   GLUCOSE 78 07/17/2023 0942   GLUCOSE 84 06/17/2023 0443   BUN 17 07/17/2023 0942   CREATININE 0.90 07/17/2023 0942   CALCIUM 9.6 07/17/2023 0942   GFRNONAA >60 06/17/2023 0443   GFRAA 68 05/06/2020 1033   Lab Results  Component Value Date   HGBA1C 5.2 07/17/2023   HGBA1C 5.7 (H) 06/01/2017   Lab Results  Component Value Date   INSULIN  9.4 06/12/2023   INSULIN  9.1 05/06/2020   Lab Results  Component Value Date   TSH 2.040 04/14/2021   CBC    Component Value Date/Time   WBC 4.5 07/17/2023 0942   WBC 8.0 06/16/2023 0440   RBC 4.35 07/17/2023 0942   RBC 4.50 06/16/2023 0440   HGB 11.5 07/17/2023 0942   HCT 37.2 07/17/2023 0942   PLT 223 07/17/2023 0942   MCV 86 07/17/2023 0942   MCH 26.4 (L) 07/17/2023 0942   MCH 26.4 06/16/2023 0440   MCHC 30.9 (L) 07/17/2023 0942   MCHC 31.9 06/16/2023 0440   RDW 14.5 07/17/2023 0942   Iron Studies    Component Value Date/Time   IRON 85 07/17/2023 0942   TIBC 300 07/17/2023 0942   FERRITIN 239 (H) 07/17/2023 0942   IRONPCTSAT 28 07/17/2023 0942   Lipid Panel     Component Value Date/Time  CHOL 203 (H) 06/12/2023 1001   TRIG 62 06/12/2023 1001   HDL 58 06/12/2023 1001   CHOLHDL 3.3 11/08/2020 1037   LDLCALC 134 (H) 06/12/2023 1001   Hepatic Function Panel     Component Value Date/Time   PROT 6.6 07/17/2023 0942   ALBUMIN 4.2 07/17/2023 0942   AST 19 07/17/2023 0942   ALT 12 07/17/2023 0942   ALKPHOS 93 07/17/2023 0942   BILITOT 0.5 07/17/2023 0942   BILIDIR 0.2 06/15/2023 0933   IBILI 1.0 (H) 06/15/2023 0933      Component Value Date/Time   TSH 2.040 04/14/2021 1014   Nutritional Lab Results  Component Value Date   VD25OH 54.1 07/17/2023   VD25OH 71.7 06/12/2023   VD25OH 64.8 08/29/2022     Assessment and Plan Assessment & Plan Obesity and Polyphagia She is being treated  for obesity with a category two eating plan, adhered to 85% of the time, and regular exercise, walking 45 minutes five times per week. She has lost two pounds in the last month. She is on Wegovy  0.5 mg for appetite control, which effectively manages her hunger, though she experiences evening cravings. She prefers to remain on the current dose of Wegovy  due to concerns about further appetite suppression, despite an external program's suggestion to increase the dose. Medical decision making considered her current response to Wegovy  and the potential negative impact of increasing the dose on her eating habits. - Continue Wegovy  0.5 mg for appetite control - Encourage adherence to the eating plan and regular exercise - Maintain a regular eating schedule to manage cravings  Hypertension Her blood pressure was elevated at 140/84 and 143/99 during the visit. She is currently on amlodipine  5 mg and metoprolol  XL 25 mg daily. Reports home blood pressure readings of 124/80, suggesting possible white coat syndrome. Advised to monitor blood pressure at home to determine if the elevation is situational or persistent. Medical decision making involved assessing the need for further treatment based on home monitoring results to differentiate between white coat syndrome and persistent hypertension. - Continue amlodipine  5 mg daily - Continue metoprolol  XL 25 mg daily - Monitor blood pressure at home regularly to assess for white coat syndrome      She was informed of the importance of frequent follow up visits to maximize her success with intensive lifestyle modifications for her multiple health conditions.    Jasmine Mesi, MD

## 2023-12-27 ENCOUNTER — Other Ambulatory Visit (HOSPITAL_COMMUNITY): Payer: Self-pay

## 2023-12-28 ENCOUNTER — Other Ambulatory Visit (HOSPITAL_COMMUNITY): Payer: Self-pay

## 2023-12-28 MED ORDER — WEGOVY 0.5 MG/0.5ML ~~LOC~~ SOAJ
0.5000 mg | SUBCUTANEOUS | 0 refills | Status: DC
  Filled 2023-12-28: qty 2, 28d supply, fill #0

## 2024-01-02 ENCOUNTER — Ambulatory Visit (INDEPENDENT_AMBULATORY_CARE_PROVIDER_SITE_OTHER): Admitting: Family Medicine

## 2024-01-02 ENCOUNTER — Encounter (INDEPENDENT_AMBULATORY_CARE_PROVIDER_SITE_OTHER): Payer: Self-pay | Admitting: Family Medicine

## 2024-01-02 VITALS — BP 143/96 | HR 65 | Temp 97.6°F | Ht 68.0 in | Wt 198.0 lb

## 2024-01-02 DIAGNOSIS — I10 Essential (primary) hypertension: Secondary | ICD-10-CM

## 2024-01-02 DIAGNOSIS — Z566 Other physical and mental strain related to work: Secondary | ICD-10-CM

## 2024-01-02 DIAGNOSIS — Z6830 Body mass index (BMI) 30.0-30.9, adult: Secondary | ICD-10-CM

## 2024-01-02 DIAGNOSIS — E669 Obesity, unspecified: Secondary | ICD-10-CM | POA: Diagnosis not present

## 2024-01-02 NOTE — Progress Notes (Signed)
 Office: [phone]  /  Fax: [phone]  WEIGHT SUMMARY AND BIOMETRICS  Anthropometric Measurements Height: 5' 8 (1.727 m) Weight: 198 lb (89.8 kg) BMI (Calculated): 30.11 Weight at Last Visit: 200 lb Weight Lost Since Last Visit: 2 lb Weight Gained Since Last Visit: 0 Starting Weight: 254 lb Total Weight Loss (lbs): 56 lb (25.4 kg) Peak Weight: 340 lb   Body Composition  Body Fat %: 42 % Fat Mass (lbs): 83.2 lbs Muscle Mass (lbs): 109 lbs Total Body Water  (lbs): 79 lbs Visceral Fat Rating : 10   Other Clinical Data Fasting: Yes Labs: No Today's Visit #: 58 Starting Date: 05/26/20    Chief Complaint: OBESITY   Discussed the use of AI scribe software for clinical note transcription with the patient, who gave verbal consent to proceed.  History of Present Illness Leslie Mason is a 53 year old female with obesity and hypertension who presents for obesity treatment.  She is adhering to the category two ED plan approximately seventy percent of the time and engages in walking for exercise, approximately 45 minutes, three to four times per week. Since her last visit, she has lost two pounds. She is actively managing her weight through diet and exercise, maintaining hydration with a 40-ounce cup of water  at home, work, and while traveling. Her diet includes protein and vegetables, and she has been preparing meals such as shrimp and cabbage dishes.  Her blood pressure readings are elevated, with measurements of 147/93 and 143/96. She attributes the recent elevation to consuming low sodium [place] on watermelon. She is currently taking metoprolol  and has previously been on chlorthalidone  and Bystolic . She reports swelling issues with her current medication. She is also monitoring her blood pressure at home.  She experiences work-related stress, particularly with upcoming reports and meetings. She is planning work trips to [place]  and [place] , which require careful  scheduling due to her coverage being on vacation.      PHYSICAL EXAM:  Blood pressure (!) 143/96, pulse 65, temperature 97.6 F (36.4 C), height 5' 8 (1.727 m), weight 198 lb (89.8 kg), last menstrual period 04/01/2017, SpO2 100%. Body mass index is 30.11 kg/m.  DIAGNOSTIC DATA REVIEWED:  BMET    Component Value Date/Time   NA 141 07/17/2023 0942   K 4.3 07/17/2023 0942   CL 101 07/17/2023 0942   CO2 27 07/17/2023 0942   GLUCOSE 78 07/17/2023 0942   GLUCOSE 84 06/17/2023 0443   BUN 17 07/17/2023 0942   CREATININE 0.90 07/17/2023 0942   CALCIUM 9.6 07/17/2023 0942   GFRNONAA >60 06/17/2023 0443   GFRAA 68 05/06/2020 1033   Lab Results  Component Value Date   HGBA1C 5.2 07/17/2023   HGBA1C 5.7 (H) 06/01/2017   Lab Results  Component Value Date   INSULIN  9.4 06/12/2023   INSULIN  9.1 05/06/2020   Lab Results  Component Value Date   TSH 2.040 04/14/2021   CBC    Component Value Date/Time   WBC 4.5 07/17/2023 0942   WBC 8.0 06/16/2023 0440   RBC 4.35 07/17/2023 0942   RBC 4.50 06/16/2023 0440   HGB 11.5 07/17/2023 0942   HCT 37.2 07/17/2023 0942   PLT 223 07/17/2023 0942   MCV 86 07/17/2023 0942   MCH 26.4 (L) 07/17/2023 0942   MCH 26.4 06/16/2023 0440   MCHC 30.9 (L) 07/17/2023 0942   MCHC 31.9 06/16/2023 0440   RDW 14.5 07/17/2023 0942   Iron Studies    Component Value Date/Time  IRON 85 07/17/2023 0942   TIBC 300 07/17/2023 0942   FERRITIN 239 (H) 07/17/2023 0942   IRONPCTSAT 28 07/17/2023 0942   Lipid Panel     Component Value Date/Time   CHOL 203 (H) 06/12/2023 1001   TRIG 62 06/12/2023 1001   HDL 58 06/12/2023 1001   CHOLHDL 3.3 11/08/2020 1037   LDLCALC 134 (H) 06/12/2023 1001   Hepatic Function Panel     Component Value Date/Time   PROT 6.6 07/17/2023 0942   ALBUMIN 4.2 07/17/2023 0942   AST 19 07/17/2023 0942   ALT 12 07/17/2023 0942   ALKPHOS 93 07/17/2023 0942   BILITOT 0.5 07/17/2023 0942   BILIDIR 0.2 06/15/2023 0933    IBILI 1.0 (H) 06/15/2023 0933      Component Value Date/Time   TSH 2.040 04/14/2021 1014   Nutritional Lab Results  Component Value Date   VD25OH 54.1 07/17/2023   VD25OH 71.7 06/12/2023   VD25OH 64.8 08/29/2022     Assessment and Plan Assessment & Plan Hypertension Blood pressure readings are elevated at 147/93 mmHg and 143/96 mmHg. Currently on metoprolol , but considering a change due to potential weight gain and swelling. Aware of dietary influences, such as sodium intake, on blood pressure. Metoprolol  is noted for its benefits but also for potential weight gain, which is a concern given her obesity management goals. - Monitor blood pressure at home - Follow up with Dr. Sharie next month for potential medication adjustment  Obesity Following the category two ED plan approximately 70% of the time and has lost two pounds in the last month. Engages in physical activity by walking for 45 minutes three to four times per week. Encouraged to continue her current exercise regimen and dietary plan, focusing on protein intake and incorporating vegetables, especially during the summer when they are more accessible. - Continue category two ED plan - Encourage walking 45 minutes 3-4 times per week - Use treadmill for indoor exercise - Focus on protein intake and incorporate vegetables  Work Stress Reports work-related stress due to upcoming travel and workload. Managing stress by planning work trips strategically to include personal time and is mindful of compartmentalizing tasks to reduce stress. - Continue stress management strategies, including strategic planning of work trips and task compartmentalization     I have personally spent 30 minutes total time today in preparation, patient care, and documentation for this visit, including the following: review of clinical lab tests; review of medical history, nutrional education  She was informed of the importance of frequent follow up  visits to maximize her success with intensive lifestyle modifications for her multiple health conditions.    Louann Penton, MD

## 2024-01-03 ENCOUNTER — Encounter (HOSPITAL_COMMUNITY): Payer: Self-pay | Admitting: *Deleted

## 2024-01-29 ENCOUNTER — Other Ambulatory Visit (HOSPITAL_COMMUNITY): Payer: Self-pay

## 2024-01-29 MED ORDER — WEGOVY 0.5 MG/0.5ML ~~LOC~~ SOAJ
0.5000 mg | SUBCUTANEOUS | 0 refills | Status: DC
  Filled 2024-01-29: qty 2, 28d supply, fill #0

## 2024-02-04 ENCOUNTER — Ambulatory Visit (INDEPENDENT_AMBULATORY_CARE_PROVIDER_SITE_OTHER): Admitting: Family Medicine

## 2024-02-04 ENCOUNTER — Encounter (INDEPENDENT_AMBULATORY_CARE_PROVIDER_SITE_OTHER): Payer: Self-pay | Admitting: Family Medicine

## 2024-02-04 VITALS — BP 145/89 | HR 66 | Temp 98.0°F | Ht 68.0 in | Wt 195.0 lb

## 2024-02-04 DIAGNOSIS — E66812 Obesity, class 2: Secondary | ICD-10-CM

## 2024-02-04 DIAGNOSIS — E669 Obesity, unspecified: Secondary | ICD-10-CM | POA: Diagnosis not present

## 2024-02-04 DIAGNOSIS — I10 Essential (primary) hypertension: Secondary | ICD-10-CM

## 2024-02-04 DIAGNOSIS — Z6829 Body mass index (BMI) 29.0-29.9, adult: Secondary | ICD-10-CM

## 2024-02-04 NOTE — Progress Notes (Signed)
 Office: [phone]  /  Fax: [phone]  WEIGHT SUMMARY AND BIOMETRICS  Anthropometric Measurements Height: 5' 8 (1.727 m) Weight: 195 lb (88.5 kg) BMI (Calculated): 29.66 Weight at Last Visit: 198lb Weight Lost Since Last Visit: 3lb Weight Gained Since Last Visit: 0lb Starting Weight: 254lb Total Weight Loss (lbs): 59 lb (26.8 kg) Peak Weight: 340lb   Body Composition  Body Fat %: 40.5 % Fat Mass (lbs): 79 lbs Muscle Mass (lbs): 110.4 lbs Total Body Water  (lbs): 78.2 lbs Visceral Fat Rating : 9   Other Clinical Data Fasting: Yes Labs: no Today's Visit #: 55 Starting Date: 05/26/20    Chief Complaint: OBESITY    History of Present Illness Leslie Mason is a 53 year old female who presents for obesity treatment.  She is adhering to a category two eating plan approximately 65% of the time, meeting her protein goals, and maintaining adequate hydration. She sometimes skips meals. She engages in physical activity for 45 minutes, five days per week, resulting in a weight loss of three pounds over the past month.  She reports generally average blood pressure readings at home with occasional elevations. She is concerned about salt intake due to her water  filtration system, but notes that her water  does not taste salty.  She is currently taking Wegovy  0.5 mg without any issues and feels it is beneficial. She recently refilled her prescription.  She experienced a week of difficulty with food intake while traveling in [place] , including two episodes of emesis, which she attributes to possibly eating too much or too fast. She did not experience diarrhea or constipation during this period, although she does not have daily bowel movements.  She has been focusing on grilling meats and avoiding fried foods, and has been creative with her meals, such as making a cheesecake with [place] yogurt and sugar-free Jell-O.      PHYSICAL EXAM:  Blood pressure (!) 145/89, pulse  66, temperature 98 F (36.7 C), height 5' 8 (1.727 m), weight 195 lb (88.5 kg), last menstrual period 04/01/2017, SpO2 100%. Body mass index is 29.65 kg/m.  DIAGNOSTIC DATA REVIEWED:  BMET    Component Value Date/Time   NA 141 07/17/2023 0942   K 4.3 07/17/2023 0942   CL 101 07/17/2023 0942   CO2 27 07/17/2023 0942   GLUCOSE 78 07/17/2023 0942   GLUCOSE 84 06/17/2023 0443   BUN 17 07/17/2023 0942   CREATININE 0.90 07/17/2023 0942   CALCIUM 9.6 07/17/2023 0942   GFRNONAA >60 06/17/2023 0443   GFRAA 68 05/06/2020 1033   Lab Results  Component Value Date   HGBA1C 5.2 07/17/2023   HGBA1C 5.7 (H) 06/01/2017   Lab Results  Component Value Date   INSULIN  9.4 06/12/2023   INSULIN  9.1 05/06/2020   Lab Results  Component Value Date   TSH 2.040 04/14/2021   CBC    Component Value Date/Time   WBC 4.5 07/17/2023 0942   WBC 8.0 06/16/2023 0440   RBC 4.35 07/17/2023 0942   RBC 4.50 06/16/2023 0440   HGB 11.5 07/17/2023 0942   HCT 37.2 07/17/2023 0942   PLT 223 07/17/2023 0942   MCV 86 07/17/2023 0942   MCH 26.4 (L) 07/17/2023 0942   MCH 26.4 06/16/2023 0440   MCHC 30.9 (L) 07/17/2023 0942   MCHC 31.9 06/16/2023 0440   RDW 14.5 07/17/2023 0942   Iron Studies    Component Value Date/Time   IRON 85 07/17/2023 0942   TIBC 300 07/17/2023 0942  FERRITIN 239 (H) 07/17/2023 0942   IRONPCTSAT 28 07/17/2023 0942   Lipid Panel     Component Value Date/Time   CHOL 203 (H) 06/12/2023 1001   TRIG 62 06/12/2023 1001   HDL 58 06/12/2023 1001   CHOLHDL 3.3 11/08/2020 1037   LDLCALC 134 (H) 06/12/2023 1001   Hepatic Function Panel     Component Value Date/Time   PROT 6.6 07/17/2023 0942   ALBUMIN 4.2 07/17/2023 0942   AST 19 07/17/2023 0942   ALT 12 07/17/2023 0942   ALKPHOS 93 07/17/2023 0942   BILITOT 0.5 07/17/2023 0942   BILIDIR 0.2 06/15/2023 0933   IBILI 1.0 (H) 06/15/2023 0933      Component Value Date/Time   TSH 2.040 04/14/2021 1014   Nutritional Lab  Results  Component Value Date   VD25OH 54.1 07/17/2023   VD25OH 71.7 06/12/2023   VD25OH 64.8 08/29/2022     Assessment and Plan Assessment & Plan Obesity Obesity management is ongoing with dietary modifications and exercise. She adheres to the category two eating plan 65% of the time, meets protein goals, and maintains adequate hydration. She exercises 45 minutes five days per week and has lost three pounds in the last month. Wegovy  0.5 mg remains effective without adverse effects. A week of reduced intake and vomiting, likely due to overeating or rapid eating, has resolved. She focuses on grilling meats, avoiding fried foods, and incorporates high-protein snacks like [place] yogurt with cheesecake Jell-O packets. - Continue category two eating plan - Continue Wegovy  0.5 mg - Continue exercise regimen - Encourage high-protein diet and hydration  Hypertension with white coat syndrome Blood pressure is elevated at 147/93 and 145/89 on repeat. She reports average home blood pressure readings with occasional elevations. A water  filtration system does not contribute to sodium intake. She experiences white coat syndrome and will follow up with Dr. Hussain, bringing her home blood pressure machine for comparison. Lifestyle modifications for hypertension management continue. - Continue monitoring blood pressure at home - Follow up with Dr. Hussain with home blood pressure machine - Continue lifestyle modifications including diet and exercise     I have personally spent 37 minutes total time today in preparation, patient care, and documentation for this visit, including the following: review of clinical lab tests; review of medical history and nutritional counseling   She was informed of the importance of frequent follow up visits to maximize her success with intensive lifestyle modifications for her multiple health conditions.    Louann Penton, MD

## 2024-02-26 ENCOUNTER — Other Ambulatory Visit (HOSPITAL_COMMUNITY): Payer: Self-pay

## 2024-02-26 MED ORDER — WEGOVY 0.5 MG/0.5ML ~~LOC~~ SOAJ
0.5000 mg | SUBCUTANEOUS | 0 refills | Status: DC
  Filled 2024-02-26: qty 2, 28d supply, fill #0

## 2024-03-04 ENCOUNTER — Ambulatory Visit (INDEPENDENT_AMBULATORY_CARE_PROVIDER_SITE_OTHER): Admitting: Family Medicine

## 2024-03-04 ENCOUNTER — Telehealth (INDEPENDENT_AMBULATORY_CARE_PROVIDER_SITE_OTHER): Payer: Self-pay

## 2024-03-04 ENCOUNTER — Encounter (INDEPENDENT_AMBULATORY_CARE_PROVIDER_SITE_OTHER): Payer: Self-pay | Admitting: Family Medicine

## 2024-03-04 VITALS — BP 142/89 | HR 68 | Temp 98.2°F | Ht 68.0 in | Wt 191.0 lb

## 2024-03-04 DIAGNOSIS — E669 Obesity, unspecified: Secondary | ICD-10-CM

## 2024-03-04 DIAGNOSIS — Z6829 Body mass index (BMI) 29.0-29.9, adult: Secondary | ICD-10-CM

## 2024-03-04 DIAGNOSIS — I10 Essential (primary) hypertension: Secondary | ICD-10-CM | POA: Diagnosis not present

## 2024-03-04 NOTE — Progress Notes (Signed)
 Office: [phone]  /  Fax: [phone]  WEIGHT SUMMARY AND BIOMETRICS  Anthropometric Measurements Height: 5' 8 (1.727 m) Weight: 191 lb (86.6 kg) BMI (Calculated): 29.05 Weight at Last Visit: 195lb Weight Lost Since Last Visit: 4lb Weight Gained Since Last Visit: 0lb Starting Weight: 254lb Total Weight Loss (lbs): 63 lb (28.6 kg) Peak Weight: 340lb   Body Composition  Body Fat %: 32.1 % Fat Mass (lbs): 61.4 lbs Muscle Mass (lbs): 123.4 lbs Total Body Water  (lbs): 90.8 lbs Visceral Fat Rating : 15   Other Clinical Data Fasting: Yes Labs: No Today's Visit #: 67 Starting Date: 05/26/20    Chief Complaint: OBESITY    History of Present Illness Leslie Mason is a 53 year old female with obesity and hypertension who presents for obesity treatment and progress assessment.  She has been following the category two eating plan about fifty percent of the time and exercises five days a week for thirty-five to forty minutes. Over the past month, she has lost four pounds. She is currently taking Wegovy , which she finds effective. She reports not feeling hungry but is reminded to eat by a supportive coworker, and she often shares meals. She shares meals, fostering a supportive social environment.  Her hypertension is currently managed with metoprolol  25 mg daily. She was previously prescribed amlodipine  5 mg daily but did not start it. She has discontinued Zofran  after an unspecified incident.  She recently traveled to [place]  and [place], experiencing some stress and long drives. During her trip, she had stomach discomfort after eating before a long drive. She spent time at the beach, walking and enjoying the environment, but did not swim. 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      PHYSICAL EXAM:  Blood pressure (!) 142/89, pulse 68, temperature 98.2 F (36.8 C), height 5' 8 (1.727 m), weight 191 lb (86.6 kg), last menstrual period 04/01/2017, SpO2  100%. Body mass index is 29.04 kg/m.  DIAGNOSTIC DATA REVIEWED:  BMET    Component Value Date/Time   NA 141 07/17/2023 0942   K 4.3 07/17/2023 0942   CL 101 07/17/2023 0942   CO2 27 07/17/2023 0942   GLUCOSE 78 07/17/2023 0942   GLUCOSE 84 06/17/2023 0443   BUN 17 07/17/2023 0942   CREATININE 0.90 07/17/2023 0942   CALCIUM 9.6 07/17/2023 0942   GFRNONAA >60 06/17/2023 0443   GFRAA 68 05/06/2020 1033   Lab Results  Component Value Date   HGBA1C 5.2 07/17/2023   HGBA1C 5.7 (H) 06/01/2017   Lab Results  Component Value Date   INSULIN  9.4 06/12/2023   INSULIN  9.1 05/06/2020   Lab Results  Component Value Date   TSH 2.040 04/14/2021   CBC    Component Value Date/Time   WBC 4.5 07/17/2023 0942   WBC 8.0 06/16/2023 0440   RBC 4.35 07/17/2023 0942   RBC 4.50 06/16/2023 0440   HGB 11.5 07/17/2023 0942   HCT 37.2 07/17/2023 0942   PLT 223 07/17/2023 0942   MCV 86 07/17/2023 0942   MCH 26.4 (L) 07/17/2023 0942   MCH 26.4 06/16/2023 0440   MCHC 30.9 (L) 07/17/2023 0942   MCHC 31.9 06/16/2023 0440   RDW 14.5 07/17/2023 0942   Iron Studies    Component Value Date/Time   IRON 85 07/17/2023 0942   TIBC 300 07/17/2023 0942   FERRITIN 239 (H) 07/17/2023 0942   IRONPCTSAT 28 07/17/2023 0942   Lipid Panel     Component  Value Date/Time   CHOL 203 (H) 06/12/2023 1001   TRIG 62 06/12/2023 1001   HDL 58 06/12/2023 1001   CHOLHDL 3.3 11/08/2020 1037   LDLCALC 134 (H) 06/12/2023 1001   Hepatic Function Panel     Component Value Date/Time   PROT 6.6 07/17/2023 0942   ALBUMIN 4.2 07/17/2023 0942   AST 19 07/17/2023 0942   ALT 12 07/17/2023 0942   ALKPHOS 93 07/17/2023 0942   BILITOT 0.5 07/17/2023 0942   BILIDIR 0.2 06/15/2023 0933   IBILI 1.0 (H) 06/15/2023 0933      Component Value Date/Time   TSH 2.040 04/14/2021 1014   Nutritional Lab Results  Component Value Date   VD25OH 54.1 07/17/2023   VD25OH 71.7 06/12/2023   VD25OH 64.8 08/29/2022      Assessment and Plan Assessment & Plan Obesity Obesity management is ongoing with a category two eating plan adhered to 50% of the time. Exercise is performed five days a week for 35-40 minutes, resulting in a weight loss of four pounds in the last month. Wegovy  is effective, though there is a noted lack of hunger which may be mental rather than medication-related. Wegovy  aids in preventing fat storage, beneficial for weight management. - Continue Wegovy  0.5 mg as prescribed - Encourage adherence to the category two eating plan - Continue current exercise regimen - Monitor weight and dietary intake  Essential hypertension Blood pressure is elevated at 142/89 mmHg. She is on metoprolol  25 mg daily. Amlodipine  was prescribed but never taken. Previous evaluation by Dr. Hussain indicated no need for medication change. Lifestyle modifications including sodium reduction and weight loss are emphasized to aid in blood pressure control. - Continue metoprolol  25 mg daily - Encourage reduction of dietary sodium - Promote weight loss and regular exercise to aid in blood pressure control  Follow-Up Follow-up appointments are being scheduled to monitor progress. - Schedule follow-up appointment in four weeks - Coordinate with [facility] to obtain recent lab results from Dr. Hussain - Plan for additional lab work at the next visit if needed   She was informed of the importance of frequent follow up visits to maximize her success with intensive lifestyle modifications for her multiple health conditions.    Louann Penton, MD

## 2024-03-04 NOTE — Telephone Encounter (Signed)
 Called [facility] at [phone] at 12:13 today to request patients most recent labs, no one answered the phone. I will try again after lunch, but if theres no answer we will need to follow up and request tomorrow.

## 2024-03-04 NOTE — Telephone Encounter (Signed)
 Accidentally entered Female on the weight strip instead of Female, so todays numbers are off. Will need to correct at next visit.  Age: 53 HT: 5'8 WT: 191.4

## 2024-04-01 ENCOUNTER — Other Ambulatory Visit (HOSPITAL_COMMUNITY): Payer: Self-pay

## 2024-04-01 MED ORDER — WEGOVY 0.5 MG/0.5ML ~~LOC~~ SOAJ
0.5000 mg | SUBCUTANEOUS | 0 refills | Status: DC
  Filled 2024-04-01: qty 2, 28d supply, fill #0

## 2024-04-07 ENCOUNTER — Encounter (INDEPENDENT_AMBULATORY_CARE_PROVIDER_SITE_OTHER): Payer: Self-pay | Admitting: Family Medicine

## 2024-04-07 ENCOUNTER — Ambulatory Visit (INDEPENDENT_AMBULATORY_CARE_PROVIDER_SITE_OTHER): Admitting: Family Medicine

## 2024-04-07 VITALS — BP 135/90 | HR 61 | Temp 98.3°F | Ht 68.0 in | Wt 190.0 lb

## 2024-04-07 DIAGNOSIS — E88819 Insulin resistance, unspecified: Secondary | ICD-10-CM

## 2024-04-07 DIAGNOSIS — E669 Obesity, unspecified: Secondary | ICD-10-CM

## 2024-04-07 DIAGNOSIS — Z6828 Body mass index (BMI) 28.0-28.9, adult: Secondary | ICD-10-CM

## 2024-04-07 DIAGNOSIS — R632 Polyphagia: Secondary | ICD-10-CM | POA: Diagnosis not present

## 2024-04-07 DIAGNOSIS — I10 Essential (primary) hypertension: Secondary | ICD-10-CM

## 2024-04-07 DIAGNOSIS — E559 Vitamin D deficiency, unspecified: Secondary | ICD-10-CM | POA: Diagnosis not present

## 2024-04-07 DIAGNOSIS — K5901 Slow transit constipation: Secondary | ICD-10-CM

## 2024-04-07 DIAGNOSIS — E538 Deficiency of other specified B group vitamins: Secondary | ICD-10-CM | POA: Diagnosis not present

## 2024-04-07 DIAGNOSIS — K5909 Other constipation: Secondary | ICD-10-CM

## 2024-04-07 NOTE — Progress Notes (Signed)
 Office: [phone]  /  Fax: [phone]  WEIGHT SUMMARY AND BIOMETRICS  Anthropometric Measurements Height: 5' 8 (1.727 m) Weight: 190 lb (86.2 kg) BMI (Calculated): 28.9 Weight at Last Visit: 191 lb Weight Lost Since Last Visit: 1 lb Weight Gained Since Last Visit: 0 Starting Weight: 254 lb Total Weight Loss (lbs): 64 lb (29 kg) Peak Weight: 340 lb   Body Composition  Body Fat %: 40.9 % Fat Mass (lbs): 77.8 lbs Muscle Mass (lbs): 106.8 lbs Total Body Water  (lbs): 78 lbs Visceral Fat Rating : 9   Other Clinical Data Fasting: yes Labs: yes Today's Visit #: 85 Starting Date: 05/26/20    Chief Complaint: OBESITY    History of Present Illness Leslie Mason is a 53 year old female with obesity and hypertension who presents for obesity treatment.  She adheres to a category two eating plan 75% of the time and engages in physical activity by walking five days a week for 45 minutes. She has lost one pound in the last month, for a total weight loss of 65 pounds.  Her blood pressure was elevated today at 142/88, with a subsequent reading of 135/90. She is currently on metoprolol  XL 25 mg for hypertension management. At home, her blood pressure readings have been normal, with a recent measurement of 121/78.  She is on Wegovy  to manage polyphagia associated with insulin  resistance. She experiences constipation as a side effect of Wegovy  and has not been using Miralax . She struggles with protein intake due to issues with milk-based proteins and is exploring alternatives like a clear protein soda. She finds it challenging to consume all meals, often requiring two attempts to eat breakfast.      PHYSICAL EXAM:  Blood pressure (!) 135/90, pulse 61, temperature 98.3 F (36.8 C), height 5' 8 (1.727 m), weight 190 lb (86.2 kg), last menstrual period 04/01/2017, SpO2 99%. Body mass index is 28.89 kg/m.  DIAGNOSTIC DATA REVIEWED:  BMET    Component Value Date/Time    NA 141 07/17/2023 0942   K 4.3 07/17/2023 0942   CL 101 07/17/2023 0942   CO2 27 07/17/2023 0942   GLUCOSE 78 07/17/2023 0942   GLUCOSE 84 06/17/2023 0443   BUN 17 07/17/2023 0942   CREATININE 0.90 07/17/2023 0942   CALCIUM 9.6 07/17/2023 0942   GFRNONAA >60 06/17/2023 0443   GFRAA 68 05/06/2020 1033   Lab Results  Component Value Date   HGBA1C 5.2 07/17/2023   HGBA1C 5.7 (H) 06/01/2017   Lab Results  Component Value Date   INSULIN  9.4 06/12/2023   INSULIN  9.1 05/06/2020   Lab Results  Component Value Date   TSH 2.040 04/14/2021   CBC    Component Value Date/Time   WBC 4.5 07/17/2023 0942   WBC 8.0 06/16/2023 0440   RBC 4.35 07/17/2023 0942   RBC 4.50 06/16/2023 0440   HGB 11.5 07/17/2023 0942   HCT 37.2 07/17/2023 0942   PLT 223 07/17/2023 0942   MCV 86 07/17/2023 0942   MCH 26.4 (L) 07/17/2023 0942   MCH 26.4 06/16/2023 0440   MCHC 30.9 (L) 07/17/2023 0942   MCHC 31.9 06/16/2023 0440   RDW 14.5 07/17/2023 0942   Iron Studies    Component Value Date/Time   IRON 85 07/17/2023 0942   TIBC 300 07/17/2023 0942   FERRITIN 239 (H) 07/17/2023 0942   IRONPCTSAT 28 07/17/2023 0942   Lipid Panel     Component Value Date/Time   CHOL 203 (H) 06/12/2023 1001  TRIG 62 06/12/2023 1001   HDL 58 06/12/2023 1001   CHOLHDL 3.3 11/08/2020 1037   LDLCALC 134 (H) 06/12/2023 1001   Hepatic Function Panel     Component Value Date/Time   PROT 6.6 07/17/2023 0942   ALBUMIN 4.2 07/17/2023 0942   AST 19 07/17/2023 0942   ALT 12 07/17/2023 0942   ALKPHOS 93 07/17/2023 0942   BILITOT 0.5 07/17/2023 0942   BILIDIR 0.2 06/15/2023 0933   IBILI 1.0 (H) 06/15/2023 0933      Component Value Date/Time   TSH 2.040 04/14/2021 1014   Nutritional Lab Results  Component Value Date   VD25OH 54.1 07/17/2023   VD25OH 71.7 06/12/2023   VD25OH 64.8 08/29/2022     Assessment and Plan Assessment & Plan Obesity with insulin  resistance and polyphagia Obesity with associated  insulin  resistance and polyphagia. She is on Wegovy  for management. She has lost 65 pounds in total, with 1 pound lost in the last month. She is following the category two eating plan 75% of the time and walking five days a week for 45 minutes. Discussed the importance of protein intake and explored options like clear protein soda to help meet protein needs. Emphasized the importance of not skipping meals to prevent muscle loss. - Continue Wegovy  for weight management. - Encourage adherence to the category two eating plan. - Encourage regular physical activity, such as walking five days a week. - Explore protein intake options, including clear protein soda.  Hypertension with white coat syndrome Hypertension, currently managed with metoprolol  XL 25 mg. Blood pressure was elevated today at 142/88 and 135/90, but she reports normal readings at home (121/78). Discussed white coat hypertension as a possible factor for elevated readings in the clinic. She is also working on diet, exercise, and weight loss to manage blood pressure. - Continue metoprolol  XL 25 mg for hypertension. - Monitor blood pressure at home regularly. - Encourage lifestyle modifications including diet and exercise.  Constipation associated with GLP-1 agonist therapy Constipation likely associated with GLP-1 agonist therapy (Wegovy ). She reports not using Miralax  as previously recommended. Discussed the importance of managing constipation, especially while on Wegovy , and the need to restart Miralax . - Restart Miralax  for constipation management.  Vitamin D  deficiency Stable - Check lab and follow  Vitamin B12 deficiency Stable - Check labs and follow  Follow-Up Follow-up plans discussed for monitoring progress and medication management. - Follow up in four weeks.     Leslie Mason was informed of the importance of frequent follow up visits to maximize her success with intensive lifestyle modifications for her obesity and  obesity related health conditions as recommended by USPSTF and CMS guidelines   Louann Penton, MD

## 2024-04-08 LAB — CMP14+EGFR
ALT: 9 IU/L (ref 0–32)
AST: 16 IU/L (ref 0–40)
Albumin: 4.3 g/dL (ref 3.8–4.9)
Alkaline Phosphatase: 121 IU/L (ref 49–135)
BUN/Creatinine Ratio: 15 (ref 9–23)
BUN: 16 mg/dL (ref 6–24)
Bilirubin Total: 0.8 mg/dL (ref 0.0–1.2)
CO2: 25 mmol/L (ref 20–29)
Calcium: 10.2 mg/dL (ref 8.7–10.2)
Chloride: 102 mmol/L (ref 96–106)
Creatinine, Ser: 1.1 mg/dL — ABNORMAL HIGH (ref 0.57–1.00)
Globulin, Total: 2.7 g/dL (ref 1.5–4.5)
Glucose: 70 mg/dL (ref 70–99)
Potassium: 4.8 mmol/L (ref 3.5–5.2)
Sodium: 142 mmol/L (ref 134–144)
Total Protein: 7 g/dL (ref 6.0–8.5)
eGFR: 60 mL/min/1.73 (ref >59)

## 2024-04-08 LAB — LIPID PANEL WITH LDL/HDL RATIO
Cholesterol, Total: 182 mg/dL (ref 100–199)
HDL: 69 mg/dL (ref >39)
LDL Chol Calc (NIH): 104 mg/dL — ABNORMAL HIGH (ref 0–99)
LDL/HDL Ratio: 1.5 ratio (ref 0.0–3.2)
Triglycerides: 43 mg/dL (ref 0–149)
VLDL Cholesterol Cal: 9 mg/dL (ref 5–40)

## 2024-04-08 LAB — HEMOGLOBIN A1C
Est. average glucose Bld gHb Est-mCnc: 103 mg/dL
Hgb A1c MFr Bld: 5.2 % (ref 4.8–5.6)

## 2024-04-08 LAB — VITAMIN D 25 HYDROXY (VIT D DEFICIENCY, FRACTURES): Vit D, 25-Hydroxy: 49.9 ng/mL (ref 30.0–100.0)

## 2024-04-08 LAB — VITAMIN B12: Vitamin B-12: 182 pg/mL — ABNORMAL LOW (ref 232–1245)

## 2024-04-08 LAB — INSULIN, RANDOM: INSULIN: 4.4 u[IU]/mL (ref 2.6–24.9)

## 2024-04-15 ENCOUNTER — Other Ambulatory Visit (HOSPITAL_COMMUNITY): Payer: Self-pay

## 2024-04-15 MED ORDER — WEGOVY 0.5 MG/0.5ML ~~LOC~~ SOAJ
0.5000 mg | SUBCUTANEOUS | 0 refills | Status: DC
  Filled 2024-04-24: qty 2, 28d supply, fill #0

## 2024-04-24 ENCOUNTER — Other Ambulatory Visit (HOSPITAL_COMMUNITY): Payer: Self-pay

## 2024-05-06 ENCOUNTER — Encounter (INDEPENDENT_AMBULATORY_CARE_PROVIDER_SITE_OTHER): Payer: Self-pay | Admitting: Family Medicine

## 2024-05-06 ENCOUNTER — Other Ambulatory Visit (HOSPITAL_COMMUNITY): Payer: Self-pay

## 2024-05-06 ENCOUNTER — Ambulatory Visit (INDEPENDENT_AMBULATORY_CARE_PROVIDER_SITE_OTHER): Payer: Self-pay | Admitting: Family Medicine

## 2024-05-06 VITALS — BP 134/95 | HR 63 | Temp 98.1°F | Ht 68.0 in | Wt 188.0 lb

## 2024-05-06 DIAGNOSIS — E782 Mixed hyperlipidemia: Secondary | ICD-10-CM

## 2024-05-06 DIAGNOSIS — I10 Essential (primary) hypertension: Secondary | ICD-10-CM | POA: Diagnosis not present

## 2024-05-06 DIAGNOSIS — E538 Deficiency of other specified B group vitamins: Secondary | ICD-10-CM

## 2024-05-06 DIAGNOSIS — Z6828 Body mass index (BMI) 28.0-28.9, adult: Secondary | ICD-10-CM

## 2024-05-06 DIAGNOSIS — E669 Obesity, unspecified: Secondary | ICD-10-CM

## 2024-05-06 DIAGNOSIS — R7989 Other specified abnormal findings of blood chemistry: Secondary | ICD-10-CM

## 2024-05-06 MED ORDER — VITAMIN B-12 1000 MCG PO TABS
1000.0000 ug | ORAL_TABLET | Freq: Every day | ORAL | 0 refills | Status: AC
  Filled 2024-05-06: qty 30, 30d supply, fill #0

## 2024-05-06 NOTE — Progress Notes (Signed)
 Office: [phone]  /  Fax: [phone]  WEIGHT SUMMARY AND BIOMETRICS  Anthropometric Measurements Height: 5' 8 (1.727 m) Weight: 188 lb (85.3 kg) BMI (Calculated): 28.59 Weight at Last Visit: 190 lb Weight Lost Since Last Visit: 2 lb Weight Gained Since Last Visit: 0 Starting Weight: 254 lb Total Weight Loss (lbs): 66 lb (29.9 kg) Peak Weight: 340 lb   Body Composition  Body Fat %: 41.4 % Fat Mass (lbs): 78.2 lbs Muscle Mass (lbs): 105 lbs Total Body Water  (lbs): 78.2 lbs Visceral Fat Rating : 9   Other Clinical Data Fasting: yes Labs: no Today's Visit #: 8 Starting Date: 05/26/20    Chief Complaint: OBESITY  History of Present Illness Leslie Mason is a 53 year old female with obesity and hypertension who presents for obesity treatment follow-up.  She adheres to the category two eating plan approximately 75% of the time and engages in walking for exercise five days a week for 45 minutes. She has lost two pounds in the last month since her last visit.  Her blood pressure was initially elevated at 148/92, and a repeat measurement remained elevated at 134/95. She has a diagnosis of hypertension with white coat syndrome and generally maintains controlled blood pressure readings at home. She is currently taking metoprolol  25 mg daily.  She recently returned from a trip to [place] where she managed her diet by cooking most of her meals, which helped her maintain her weight despite being on vacation. She enjoyed local foods like roti and managed to avoid gaining weight during her trip.  Recent lab results showed an increase in creatinine and an elevated LDL cholesterol level. Her hemoglobin A1c is 5.2, and her insulin  level is 4.4.  She has a history of anemia and recent labs indicated low B12 levels. She is not currently taking a B12 supplement.      PHYSICAL EXAM:  Blood pressure (!) 134/95, pulse 63, temperature 98.1 F (36.7 C), height 5' 8 (1.727 m),  weight 188 lb (85.3 kg), last menstrual period 04/01/2017, SpO2 99%. Body mass index is 28.59 kg/m.  DIAGNOSTIC DATA REVIEWED:  BMET    Component Value Date/Time   NA 142 04/07/2024 0935   K 4.8 04/07/2024 0935   CL 102 04/07/2024 0935   CO2 25 04/07/2024 0935   GLUCOSE 70 04/07/2024 0935   GLUCOSE 84 06/17/2023 0443   BUN 16 04/07/2024 0935   CREATININE 1.10 (H) 04/07/2024 0935   CALCIUM 10.2 04/07/2024 0935   GFRNONAA >60 06/17/2023 0443   GFRAA 68 05/06/2020 1033   Lab Results  Component Value Date   HGBA1C 5.2 04/07/2024   HGBA1C 5.7 (H) 06/01/2017   Lab Results  Component Value Date   INSULIN  4.4 04/07/2024   INSULIN  9.1 05/06/2020   Lab Results  Component Value Date   TSH 2.040 04/14/2021   CBC    Component Value Date/Time   WBC 4.5 07/17/2023 0942   WBC 8.0 06/16/2023 0440   RBC 4.35 07/17/2023 0942   RBC 4.50 06/16/2023 0440   HGB 11.5 07/17/2023 0942   HCT 37.2 07/17/2023 0942   PLT 223 07/17/2023 0942   MCV 86 07/17/2023 0942   MCH 26.4 (L) 07/17/2023 0942   MCH 26.4 06/16/2023 0440   MCHC 30.9 (L) 07/17/2023 0942   MCHC 31.9 06/16/2023 0440   RDW 14.5 07/17/2023 0942   Iron Studies    Component Value Date/Time   IRON 85 07/17/2023 0942   TIBC 300 07/17/2023 0942  FERRITIN 239 (H) 07/17/2023 0942   IRONPCTSAT 28 07/17/2023 0942   Lipid Panel     Component Value Date/Time   CHOL 182 04/07/2024 0935   TRIG 43 04/07/2024 0935   HDL 69 04/07/2024 0935   CHOLHDL 3.3 11/08/2020 1037   LDLCALC 104 (H) 04/07/2024 0935   Hepatic Function Panel     Component Value Date/Time   PROT 7.0 04/07/2024 0935   ALBUMIN 4.3 04/07/2024 0935   AST 16 04/07/2024 0935   ALT 9 04/07/2024 0935   ALKPHOS 121 04/07/2024 0935   BILITOT 0.8 04/07/2024 0935   BILIDIR 0.2 06/15/2023 0933   IBILI 1.0 (H) 06/15/2023 0933      Component Value Date/Time   TSH 2.040 04/14/2021 1014   Nutritional Lab Results  Component Value Date   VD25OH 49.9 04/07/2024    VD25OH 54.1 07/17/2023   VD25OH 71.7 06/12/2023     Assessment and Plan Assessment & Plan Obesity Management is ongoing with adherence to the category two eating plan 75% of the time and regular exercise. Weight loss of 2 pounds in the last month indicates progress. 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 Currently on metoprolol  25 mg daily. - Continue metoprolol  25 mg daily  Low vitamin B12 level Vitamin B12 level is low, possibly due to dietary intake or absorption issues. Previous anemia noted, but current hemoglobin levels are not concerning. - Prescribed 1000 mcg of B12 daily - Instructed to check with pharmacy for cost-effective option between prescription and over-the-counter  Borderline elevated LDL cholesterol LDL cholesterol is slightly elevated, possibly due to dietary factors such as animal fat intake. Current levels are not significantly concerning but require monitoring. - Monitor dietary intake, focusing on lean protein sources  Borderline elevated creatinine with possible dehydration Creatinine levels are slightly elevated, possibly due to dehydration. BUN levels suggest possible dehydration. - Increase hydration to improve kidney function   Laquandra was counseled on the importance of maintaining healthy lifestyle habits, including balanced nutrition, regular physical activity, and behavioral modifications, while taking antiobesity medication.  Patient verbalized understanding that medication is an adjunct to, not a replacement for, lifestyle changes and that the long-term success and weight maintenance depend on continued adherence to these strategies.   Wessie was informed of the importance of frequent follow up visits to maximize her success  with intensive lifestyle modifications for her obesity and obesity related health conditions as recommended by USPSTF and CMS guidelines   Louann Penton, MD

## 2024-05-19 ENCOUNTER — Other Ambulatory Visit (HOSPITAL_COMMUNITY): Payer: Self-pay

## 2024-05-19 MED ORDER — WEGOVY 0.5 MG/0.5ML ~~LOC~~ SOAJ
0.5000 mg | SUBCUTANEOUS | 0 refills | Status: DC
  Filled 2024-05-19: qty 2, 28d supply, fill #0

## 2024-06-03 ENCOUNTER — Ambulatory Visit (INDEPENDENT_AMBULATORY_CARE_PROVIDER_SITE_OTHER): Payer: Self-pay | Admitting: Family Medicine

## 2024-06-03 ENCOUNTER — Encounter (INDEPENDENT_AMBULATORY_CARE_PROVIDER_SITE_OTHER): Payer: Self-pay | Admitting: Family Medicine

## 2024-06-03 VITALS — BP 131/91 | HR 68 | Temp 98.1°F | Ht 68.0 in | Wt 190.0 lb

## 2024-06-03 DIAGNOSIS — E538 Deficiency of other specified B group vitamins: Secondary | ICD-10-CM | POA: Diagnosis not present

## 2024-06-03 DIAGNOSIS — I10 Essential (primary) hypertension: Secondary | ICD-10-CM | POA: Diagnosis not present

## 2024-06-03 DIAGNOSIS — F5089 Other specified eating disorder: Secondary | ICD-10-CM

## 2024-06-03 DIAGNOSIS — Z566 Other physical and mental strain related to work: Secondary | ICD-10-CM

## 2024-06-03 DIAGNOSIS — E669 Obesity, unspecified: Secondary | ICD-10-CM

## 2024-06-03 DIAGNOSIS — Z6828 Body mass index (BMI) 28.0-28.9, adult: Secondary | ICD-10-CM

## 2024-06-03 NOTE — Progress Notes (Signed)
 Office: [phone]  /  Fax: [phone]  WEIGHT SUMMARY AND BIOMETRICS  Anthropometric Measurements Height: 5' 8 (1.727 m) Weight: 190 lb (86.2 kg) BMI (Calculated): 28.9 Weight at Last Visit: 188 lb Weight Lost Since Last Visit: 0 Weight Gained Since Last Visit: 2 lb Starting Weight: 254 lb Total Weight Loss (lbs): 64 lb (29 kg) Peak Weight: 340 lb   Body Composition  Body Fat %: 41.3 % Fat Mass (lbs): 78.8 lbs Muscle Mass (lbs): 106.2 lbs Total Body Water  (lbs): 77.8 lbs Visceral Fat Rating : 9   Other Clinical Data Fasting: yes Labs: no Today's Visit #: 26 Starting Date: 05/26/20    Chief Complaint: OBESITY   History of Present Illness Leslie Mason is a 53 year old female with obesity and hypertension who presents for obesity treatment and progress assessment.  She has been adhering to the category two eating plan approximately 65% of the time and is not currently engaging in any exercise. She has experienced a weight gain of two pounds over the past month, which she attributes to work-related stress rather than holiday indulgence. Her work environment is highly stressful, leading to emotional eating and a lack of physical activity. She is considering being more strict with her eating plan and exploring different protein sources to maintain dietary interest.  She has a history of hypertension and is currently on metoprolol  25 mg daily. Her blood pressure was initially elevated at 156/103 but improved to 131/91 on repeat measurement, although it remains above her target. She has a history of white coat syndrome, with typically better blood pressure readings at home.  She is currently using Wegovy  0.5 mg for weight management, which is covered by her insurance. She has encountered issues with her FSA card, causing frustration.  Her work schedule is demanding, with long hours and minimal time off, contributing to her emotional eating behaviors. She is exploring  strategies to incorporate walking into her routine, such as indoor walking videos, due to weather and time constraints.      PHYSICAL EXAM:  Blood pressure (!) 131/91, pulse 68, temperature 98.1 F (36.7 C), height 5' 8 (1.727 m), weight 190 lb (86.2 kg), last menstrual period 04/01/2017, SpO2 99%. Body mass index is 28.89 kg/m.  DIAGNOSTIC DATA REVIEWED:  BMET    Component Value Date/Time   NA 142 04/07/2024 0935   K 4.8 04/07/2024 0935   CL 102 04/07/2024 0935   CO2 25 04/07/2024 0935   GLUCOSE 70 04/07/2024 0935   GLUCOSE 84 06/17/2023 0443   BUN 16 04/07/2024 0935   CREATININE 1.10 (H) 04/07/2024 0935   CALCIUM 10.2 04/07/2024 0935   GFRNONAA >60 06/17/2023 0443   GFRAA 68 05/06/2020 1033   Lab Results  Component Value Date   HGBA1C 5.2 04/07/2024   HGBA1C 5.7 (H) 06/01/2017   Lab Results  Component Value Date   INSULIN  4.4 04/07/2024   INSULIN  9.1 05/06/2020   Lab Results  Component Value Date   TSH 2.040 04/14/2021   CBC    Component Value Date/Time   WBC 4.5 07/17/2023 0942   WBC 8.0 06/16/2023 0440   RBC 4.35 07/17/2023 0942   RBC 4.50 06/16/2023 0440   HGB 11.5 07/17/2023 0942   HCT 37.2 07/17/2023 0942   PLT 223 07/17/2023 0942   MCV 86 07/17/2023 0942   MCH 26.4 (L) 07/17/2023 0942   MCH 26.4 06/16/2023 0440   MCHC 30.9 (L) 07/17/2023 0942   MCHC 31.9 06/16/2023 0440  RDW 14.5 07/17/2023 0942   Iron Studies    Component Value Date/Time   IRON 85 07/17/2023 0942   TIBC 300 07/17/2023 0942   FERRITIN 239 (H) 07/17/2023 0942   IRONPCTSAT 28 07/17/2023 0942   Lipid Panel     Component Value Date/Time   CHOL 182 04/07/2024 0935   TRIG 43 04/07/2024 0935   HDL 69 04/07/2024 0935   CHOLHDL 3.3 11/08/2020 1037   LDLCALC 104 (H) 04/07/2024 0935   Hepatic Function Panel     Component Value Date/Time   PROT 7.0 04/07/2024 0935   ALBUMIN 4.3 04/07/2024 0935   AST 16 04/07/2024 0935   ALT 9 04/07/2024 0935   ALKPHOS 121 04/07/2024  0935   BILITOT 0.8 04/07/2024 0935   BILIDIR 0.2 06/15/2023 0933   IBILI 1.0 (H) 06/15/2023 0933      Component Value Date/Time   TSH 2.040 04/14/2021 1014   Nutritional Lab Results  Component Value Date   VD25OH 49.9 04/07/2024   VD25OH 54.1 07/17/2023   VD25OH 71.7 06/12/2023     Assessment and Plan Assessment & Plan Obesity with emotional eating behaviors Obesity with elevated BMI, currently on a category two eating plan followed 65% of the time. Gained 2 pounds in the last month, attributed to work-related stress and emotional eating. Not currently exercising. On Wegovy  0.5 mg, administered through insurance. Focus on increasing protein intake and exploring new recipes to prevent boredom with food. Plans to incorporate weather-appropriate walking and indoor walking videos for exercise. - Continue category two eating plan with focus on increasing protein intake. - Explore new recipes to prevent boredom with food. - Incorporate weather-appropriate walking and indoor walking videos for exercise. - Continue Wegovy  0.5 mg as prescribed.  Essential hypertension with White coat syndrome Hypertension managed with metoprolol  25 mg daily. Blood pressure elevated at 156/103 initially, improved to 131/91 on repeat measurement, still above goal. White coat syndrome noted, with better control at home. Stress from work may be contributing to elevated blood pressure. - Continue metoprolol  25 mg daily. - Monitor blood pressure at home. - Continue diet, exercise and weight loss as discussed today as an important part of the treatment plan   Work-related stress contributing to emotional eating Significant work-related stress contributing to emotional eating behaviors. Stress from work environment and lack of time off impacting mental health and weight management. Plans to address stress by planning time off and considering future career changes. - Plan time off to manage stress. - Be mindful of  stress eating and skipping meals which will decrease RMR.  B12 deficiency Stable on B12 Rx - RF B12 and continue to monitor      Patients who are on anti-obesity medications are counseled on the importance of maintaining healthy lifestyle habits, including balanced nutrition, regular physical activity, and behavioral modifications,  Medication is an adjunct to, not a replacement for, lifestyle changes and that the long-term success and weight maintenance depend on continued adherence to these strategies.   Deanette was informed of the importance of frequent follow up visits to maximize her success with intensive lifestyle modifications for her obesity and obesity related health conditions as recommended by USPSTF and CMS guidelines  I personally spent a total of 22 minutes in the care of the patient today including documenting clinical information in the EMR, customized nutritional counseling for their specific health and social needs, and discussing emotional eating behaviors and how to make strategies to change these behaviors.  Louann Penton, MD

## 2024-06-13 ENCOUNTER — Other Ambulatory Visit (HOSPITAL_COMMUNITY): Payer: Self-pay

## 2024-06-13 MED ORDER — WEGOVY 0.5 MG/0.5ML ~~LOC~~ SOAJ
0.5000 mg | SUBCUTANEOUS | 0 refills | Status: DC
  Filled 2024-06-13: qty 2, 28d supply, fill #0

## 2024-07-10 ENCOUNTER — Other Ambulatory Visit (HOSPITAL_COMMUNITY): Payer: Self-pay

## 2024-07-14 ENCOUNTER — Other Ambulatory Visit (HOSPITAL_COMMUNITY): Payer: Self-pay

## 2024-07-14 ENCOUNTER — Ambulatory Visit (INDEPENDENT_AMBULATORY_CARE_PROVIDER_SITE_OTHER): Admitting: Family Medicine

## 2024-07-14 ENCOUNTER — Encounter (INDEPENDENT_AMBULATORY_CARE_PROVIDER_SITE_OTHER): Payer: Self-pay | Admitting: Family Medicine

## 2024-07-14 VITALS — BP 145/92 | HR 66 | Temp 98.0°F | Ht 68.0 in | Wt 188.0 lb

## 2024-07-14 DIAGNOSIS — E669 Obesity, unspecified: Secondary | ICD-10-CM | POA: Diagnosis not present

## 2024-07-14 DIAGNOSIS — Z6828 Body mass index (BMI) 28.0-28.9, adult: Secondary | ICD-10-CM

## 2024-07-14 DIAGNOSIS — I10 Essential (primary) hypertension: Secondary | ICD-10-CM | POA: Diagnosis not present

## 2024-07-14 NOTE — Progress Notes (Signed)
 "  Office: [phone]  /  Fax: [phone]  WEIGHT SUMMARY AND BIOMETRICS  Anthropometric Measurements Height: 5' 8 (1.727 m) Weight: 188 lb (85.3 kg) BMI (Calculated): 28.59 Weight at Last Visit: 190 lb Weight Lost Since Last Visit: 2 lb Weight Gained Since Last Visit: 0 Starting Weight: 254 lb Total Weight Loss (lbs): 66 lb (29.9 kg) Peak Weight: 340 lb   Body Composition  Body Fat %: 41.5 % Fat Mass (lbs): 78.4 lbs Muscle Mass (lbs): 104.8 lbs Total Body Water  (lbs): 78.8 lbs Visceral Fat Rating : 9   Other Clinical Data Fasting: yes Labs: no Today's Visit #: 60 Starting Date: 05/26/20    Chief Complaint: OBESITY    History of Present Illness Leslie Mason is a 54 year old female who presents for obesity treatment and progress assessment.  She has been following the category two eating plan approximately 75% of the time and has incorporated walking for exercise, 45 minutes, five days a week. Despite the holiday season, she has managed to lose two pounds over the last month. She is currently using Wegovy  for weight management and is in the process of obtaining a refill, having encountered issues with her insurance and pharmacy regarding the dosage.  Her blood pressure readings in the office have been elevated, with a measurement today of 145/92. She is currently taking metoprolol  XL 25 mg daily.  She is experiencing challenges with meal planning and preparation, expressing difficulty with variety and fatigue from repetitive meals, particularly chicken. She avoids red meat due to difficulty in digestion and prefers vegetables like spinach, broccoli, and kale. She also uses quinoa but avoids rice as it affects her meal completion.  She reports a recent stressful period due to family issues, including her uncle's passing and her mother's hospitalization over Christmas, which impacted her ability to maintain her medication schedule and sleep. She is actively  involved in family care, particularly during her mother's recent hospitalization, which has impacted her sleep and medication adherence.  She experienced gastrointestinal distress after consuming a [employer] protein drink, which she attributes to sugar alcohols, and has been using Miralax  for bowel regularity.      PHYSICAL EXAM:  Blood pressure (!) 145/92, pulse 66, temperature 98 F (36.7 C), height 5' 8 (1.727 m), weight 188 lb (85.3 kg), last menstrual period 04/01/2017, SpO2 99%. Body mass index is 28.59 kg/m.  DIAGNOSTIC DATA REVIEWED:  BMET    Component Value Date/Time   NA 142 04/07/2024 0935   K 4.8 04/07/2024 0935   CL 102 04/07/2024 0935   CO2 25 04/07/2024 0935   GLUCOSE 70 04/07/2024 0935   GLUCOSE 84 06/17/2023 0443   BUN 16 04/07/2024 0935   CREATININE 1.10 (H) 04/07/2024 0935   CALCIUM 10.2 04/07/2024 0935   GFRNONAA >60 06/17/2023 0443   GFRAA 68 05/06/2020 1033   Lab Results  Component Value Date   HGBA1C 5.2 04/07/2024   HGBA1C 5.7 (H) 06/01/2017   Lab Results  Component Value Date   INSULIN  4.4 04/07/2024   INSULIN  9.1 05/06/2020   Lab Results  Component Value Date   TSH 2.040 04/14/2021   CBC    Component Value Date/Time   WBC 4.5 07/17/2023 0942   WBC 8.0 06/16/2023 0440   RBC 4.35 07/17/2023 0942   RBC 4.50 06/16/2023 0440   HGB 11.5 07/17/2023 0942   HCT 37.2 07/17/2023 0942   PLT 223 07/17/2023 0942   MCV 86 07/17/2023 0942   MCH 26.4 (L) 07/17/2023  0942   MCH 26.4 06/16/2023 0440   MCHC 30.9 (L) 07/17/2023 0942   MCHC 31.9 06/16/2023 0440   RDW 14.5 07/17/2023 0942   Iron Studies    Component Value Date/Time   IRON 85 07/17/2023 0942   TIBC 300 07/17/2023 0942   FERRITIN 239 (H) 07/17/2023 0942   IRONPCTSAT 28 07/17/2023 0942   Lipid Panel     Component Value Date/Time   CHOL 182 04/07/2024 0935   TRIG 43 04/07/2024 0935   HDL 69 04/07/2024 0935   CHOLHDL 3.3 11/08/2020 1037   LDLCALC 104 (H) 04/07/2024 0935    Hepatic Function Panel     Component Value Date/Time   PROT 7.0 04/07/2024 0935   ALBUMIN 4.3 04/07/2024 0935   AST 16 04/07/2024 0935   ALT 9 04/07/2024 0935   ALKPHOS 121 04/07/2024 0935   BILITOT 0.8 04/07/2024 0935   BILIDIR 0.2 06/15/2023 0933   IBILI 1.0 (H) 06/15/2023 0933      Component Value Date/Time   TSH 2.040 04/14/2021 1014   Nutritional Lab Results  Component Value Date   VD25OH 49.9 04/07/2024   VD25OH 54.1 07/17/2023   VD25OH 71.7 06/12/2023     Assessment and Plan Assessment & Plan Generalized obesity She has lost two pounds over the last month, including during the Christmas and New Year's period. She adheres to the category two eating plan about 75% of the time and engages in 45 minutes of walking exercise five days a week. She is on Wegovy  and is experiencing challenges with meal planning and prepping, particularly with protein intake and variety. She is considering meal prep options to maintain adequate protein and caloric intake. - Continue category two eating plan. - Continue walking exercise 45 minutes, five days a week. - Provided meal prep ideas to maintain adequate protein and caloric intake. - Ensure Wegovy  prescription is filled at 0.5 mg.  Hypertension with white coat syndrome Blood pressure is elevated at 145/92 mmHg despite being on metoprolol  XL 25 mg daily. Blood pressure has not been well controlled in the office for a while. White coat syndrome may contribute to elevated readings. - Continue metoprolol  XL 25 mg daily. - Monitor blood pressure regularly. - Continue diet, exercise and weight loss as discussed today as an important part of the treatment plan       Patients who are on anti-obesity medications are counseled on the importance of maintaining healthy lifestyle habits, including balanced nutrition, regular physical activity, and behavioral modifications,  Medication is an adjunct to, not a replacement for, lifestyle changes  and that the long-term success and weight maintenance depend on continued adherence to these strategies.   Zoa was informed of the importance of frequent follow up visits to maximize her success with intensive lifestyle modifications for her obesity and obesity related health conditions as recommended by USPSTF and CMS guidelines   Louann Penton, MD   "

## 2024-07-16 ENCOUNTER — Other Ambulatory Visit (HOSPITAL_COMMUNITY): Payer: Self-pay

## 2024-07-16 MED ORDER — WEGOVY 0.5 MG/0.5ML ~~LOC~~ SOAJ
0.5000 mg | SUBCUTANEOUS | 0 refills | Status: DC
  Filled 2024-07-16: qty 2, 28d supply, fill #0

## 2024-08-05 ENCOUNTER — Other Ambulatory Visit (HOSPITAL_COMMUNITY): Payer: Self-pay

## 2024-08-05 MED ORDER — WEGOVY 0.5 MG/0.5ML ~~LOC~~ SOAJ: 0.5000 mg | SUBCUTANEOUS | 0 refills | Status: AC

## 2024-08-12 ENCOUNTER — Ambulatory Visit (INDEPENDENT_AMBULATORY_CARE_PROVIDER_SITE_OTHER): Admitting: Family Medicine

## 2024-09-09 ENCOUNTER — Ambulatory Visit (INDEPENDENT_AMBULATORY_CARE_PROVIDER_SITE_OTHER): Admitting: Family Medicine
